# Patient Record
Sex: Female | Born: 1955 | Race: Black or African American | Hispanic: No | Marital: Single | Smoking: Never smoker
Health system: Southern US, Community
[De-identification: ages and names within clinical notes are randomized; demographics above are authoritative.]

## PROBLEM LIST (undated history)

## (undated) DIAGNOSIS — M1990 Unspecified osteoarthritis, unspecified site: Secondary | ICD-10-CM

## (undated) DIAGNOSIS — F419 Anxiety disorder, unspecified: Secondary | ICD-10-CM

## (undated) DIAGNOSIS — K219 Gastro-esophageal reflux disease without esophagitis: Secondary | ICD-10-CM

## (undated) DIAGNOSIS — E119 Type 2 diabetes mellitus without complications: Secondary | ICD-10-CM

## (undated) DIAGNOSIS — I10 Essential (primary) hypertension: Secondary | ICD-10-CM

## (undated) DIAGNOSIS — F0390 Unspecified dementia without behavioral disturbance: Secondary | ICD-10-CM

## (undated) HISTORY — PX: TUBAL LIGATION: SHX77

## (undated) HISTORY — PX: NO PAST SURGERIES: SHX2092

---
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
## 2005-12-11 ENCOUNTER — Emergency Department: Payer: Self-pay | Admitting: Emergency Medicine

## 2006-10-24 ENCOUNTER — Emergency Department: Payer: Self-pay | Admitting: Emergency Medicine

## 2006-12-12 ENCOUNTER — Emergency Department: Payer: Self-pay | Admitting: Emergency Medicine

## 2009-03-21 ENCOUNTER — Emergency Department: Payer: Self-pay | Admitting: Emergency Medicine

## 2010-03-09 ENCOUNTER — Inpatient Hospital Stay: Payer: Self-pay | Admitting: Psychiatry

## 2011-01-13 ENCOUNTER — Ambulatory Visit: Payer: Self-pay | Admitting: Family Medicine

## 2011-01-30 ENCOUNTER — Ambulatory Visit: Payer: Self-pay | Admitting: Family Medicine

## 2011-04-20 ENCOUNTER — Emergency Department: Payer: Self-pay | Admitting: Unknown Physician Specialty

## 2011-08-17 LAB — CBC: HGB: 11.4 g/dL — ABNORMAL LOW (ref 12.0–16.0)

## 2011-08-17 LAB — COMPREHENSIVE METABOLIC PANEL
Alkaline Phosphatase: 70 U/L (ref 50–136)
BUN: 23 mg/dL — ABNORMAL HIGH (ref 7–18)
Bilirubin,Total: 0.2 mg/dL (ref 0.2–1.0)
Calcium, Total: 10 mg/dL (ref 8.5–10.1)
Co2: 30 mmol/L (ref 21–32)
Creatinine: 1.15 mg/dL (ref 0.60–1.30)
Glucose: 138 mg/dL — ABNORMAL HIGH (ref 65–99)
Potassium: 3.9 mmol/L (ref 3.5–5.1)
SGOT(AST): 21 U/L (ref 15–37)
SGPT (ALT): 21 U/L
Sodium: 140 mmol/L (ref 136–145)
Total Protein: 8.3 g/dL — ABNORMAL HIGH (ref 6.4–8.2)

## 2011-08-17 LAB — ETHANOL
Ethanol %: <0.003 % (ref 0.000–0.080)
Ethanol: <3 mg/dL

## 2011-08-17 LAB — DRUG SCREEN, URINE
Amphetamines, Ur Screen: NEGATIVE (ref ?–1000)
Barbiturates, Ur Screen: NEGATIVE (ref ?–200)
Cannabinoid 50 Ng, Ur ~~LOC~~: NEGATIVE (ref ?–50)
Cocaine Metabolite,Ur ~~LOC~~: NEGATIVE (ref ?–300)
Methadone, Ur Screen: NEGATIVE (ref ?–300)
Opiate, Ur Screen: NEGATIVE (ref ?–300)
Phencyclidine (PCP) Ur S: NEGATIVE (ref ?–25)
Tricyclic, Ur Screen: NEGATIVE (ref ?–1000)

## 2011-08-17 LAB — SALICYLATE LEVEL: Salicylates, Serum: <1.7 mg/dL

## 2011-08-18 ENCOUNTER — Inpatient Hospital Stay: Payer: Self-pay | Admitting: Psychiatry

## 2012-05-19 LAB — HM DIABETES EYE EXAM

## 2012-08-23 ENCOUNTER — Ambulatory Visit: Payer: Self-pay | Admitting: Adult Health

## 2012-08-25 ENCOUNTER — Ambulatory Visit: Payer: Self-pay | Admitting: Adult Health

## 2012-10-12 ENCOUNTER — Ambulatory Visit: Payer: Self-pay

## 2014-01-14 ENCOUNTER — Emergency Department: Payer: Self-pay | Admitting: Emergency Medicine

## 2014-01-31 ENCOUNTER — Ambulatory Visit: Payer: Self-pay

## 2014-02-19 ENCOUNTER — Ambulatory Visit: Payer: Self-pay | Admitting: Nurse Practitioner

## 2014-03-20 ENCOUNTER — Ambulatory Visit: Payer: Self-pay | Admitting: Nurse Practitioner

## 2014-04-18 ENCOUNTER — Ambulatory Visit: Payer: Self-pay

## 2014-04-20 ENCOUNTER — Ambulatory Visit: Payer: Self-pay | Admitting: Nurse Practitioner
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
## 2014-08-23 ENCOUNTER — Ambulatory Visit: Payer: Self-pay

## 2014-10-02 ENCOUNTER — Emergency Department
Admission: EM | Admit: 2014-10-02 | Discharge: 2014-10-04 | Disposition: A | Discharge status: Other Acute Inpatient Hospital | Payer: Medicaid Other | Attending: Student | Admitting: Student

## 2014-10-02 DIAGNOSIS — R45851 Suicidal ideations: Secondary | ICD-10-CM

## 2014-10-02 DIAGNOSIS — F600 Paranoid personality disorder: Secondary | ICD-10-CM

## 2014-10-02 DIAGNOSIS — F99 Mental disorder, not otherwise specified: Secondary | ICD-10-CM | POA: Diagnosis not present

## 2014-10-02 DIAGNOSIS — I10 Essential (primary) hypertension: Secondary | ICD-10-CM

## 2014-10-02 DIAGNOSIS — R45850 Homicidal ideations: Secondary | ICD-10-CM

## 2014-10-02 DIAGNOSIS — E119 Type 2 diabetes mellitus without complications: Secondary | ICD-10-CM | POA: Insufficient documentation

## 2014-10-02 DIAGNOSIS — F322 Major depressive disorder, single episode, severe without psychotic features: Secondary | ICD-10-CM

## 2014-10-02 HISTORY — DX: Anxiety disorder, unspecified: F41.9

## 2014-10-02 HISTORY — DX: Type 2 diabetes mellitus without complications: E11.9

## 2014-10-02 HISTORY — DX: Essential (primary) hypertension: I10

## 2014-10-02 LAB — COMPREHENSIVE METABOLIC PANEL
ALK PHOS: 74 U/L (ref 38–126)
ALT: 17 U/L (ref 14–54)
ANION GAP: 8 (ref 5–15)
AST: 20 U/L (ref 15–41)
Albumin: 4.6 g/dL (ref 3.5–5.0)
BILIRUBIN TOTAL: 0.2 mg/dL — AB (ref 0.3–1.2)
BUN: 29 mg/dL — ABNORMAL HIGH (ref 6–20)
CALCIUM: 10.1 mg/dL (ref 8.9–10.3)
CO2: 27 mmol/L (ref 22–32)
Chloride: 102 mmol/L (ref 101–111)
Creatinine, Ser: 1.44 mg/dL — ABNORMAL HIGH (ref 0.44–1.00)
GFR calc Af Amer: 45 mL/min — ABNORMAL LOW (ref >60)
GFR, EST NON AFRICAN AMERICAN: 39 mL/min — AB (ref >60)
Glucose, Bld: 230 mg/dL — ABNORMAL HIGH (ref 65–99)
Potassium: 3.9 mmol/L (ref 3.5–5.1)
SODIUM: 137 mmol/L (ref 135–145)
TOTAL PROTEIN: 8.3 g/dL — AB (ref 6.5–8.1)

## 2014-10-02 LAB — URINE DRUG SCREEN, QUALITATIVE (ARMC ONLY)
AMPHETAMINES, UR SCREEN: NOT DETECTED
Barbiturates, Ur Screen: NOT DETECTED
Benzodiazepine, Ur Scrn: NOT DETECTED
COCAINE METABOLITE, UR ~~LOC~~: NOT DETECTED
Cannabinoid 50 Ng, Ur ~~LOC~~: NOT DETECTED
MDMA (Ecstasy)Ur Screen: NOT DETECTED
Methadone Scn, Ur: NOT DETECTED
OPIATE, UR SCREEN: NOT DETECTED
PHENCYCLIDINE (PCP) UR S: NOT DETECTED
TRICYCLIC, UR SCREEN: NOT DETECTED

## 2014-10-02 LAB — CBC
HCT: 37.1 % (ref 35.0–47.0)
Hemoglobin: 11.7 g/dL — ABNORMAL LOW (ref 12.0–16.0)
MCH: 29.3 pg (ref 26.0–34.0)
MCHC: 31.6 g/dL — AB (ref 32.0–36.0)
MCV: 92.7 fL (ref 80.0–100.0)
PLATELETS: 292 10*3/uL (ref 150–440)
RBC: 4 MIL/uL (ref 3.80–5.20)
RDW: 12.9 % (ref 11.5–14.5)
WBC: 8.8 10*3/uL (ref 3.6–11.0)

## 2014-10-02 LAB — ACETAMINOPHEN LEVEL: Acetaminophen (Tylenol), Serum: <10 ug/mL — ABNORMAL LOW (ref 10–30)

## 2014-10-02 LAB — URINALYSIS COMPLETE WITH MICROSCOPIC (ARMC ONLY)
Bilirubin Urine: NEGATIVE
GLUCOSE, UA: NEGATIVE mg/dL
HGB URINE DIPSTICK: NEGATIVE
Ketones, ur: NEGATIVE mg/dL
NITRITE: NEGATIVE
PH: 5 (ref 5.0–8.0)
Protein, ur: NEGATIVE mg/dL
Specific Gravity, Urine: 1.018 (ref 1.005–1.030)

## 2014-10-02 LAB — ETHANOL: Alcohol, Ethyl (B): <5 mg/dL (ref <5)

## 2014-10-02 LAB — SALICYLATE LEVEL

## 2014-10-02 NOTE — ED Notes (Signed)
Collected yellow ring from pt. In traige  Put in urine cup with label  Put in belonging bag LM EDT
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
## 2014-10-03 ENCOUNTER — Other Ambulatory Visit: Payer: Self-pay

## 2014-10-03 ENCOUNTER — Encounter: Payer: Self-pay | Admitting: Emergency Medicine

## 2014-10-03 DIAGNOSIS — F332 Major depressive disorder, recurrent severe without psychotic features: Secondary | ICD-10-CM

## 2014-10-03 DIAGNOSIS — E119 Type 2 diabetes mellitus without complications: Secondary | ICD-10-CM

## 2014-10-03 DIAGNOSIS — F600 Paranoid personality disorder: Secondary | ICD-10-CM

## 2014-10-03 DIAGNOSIS — F322 Major depressive disorder, single episode, severe without psychotic features: Secondary | ICD-10-CM

## 2014-10-03 DIAGNOSIS — I10 Essential (primary) hypertension: Secondary | ICD-10-CM

## 2014-10-03 LAB — GLUCOSE, CAPILLARY
Glucose-Capillary: 389 mg/dL — ABNORMAL HIGH (ref 65–99)
Glucose-Capillary: 322 mg/dL — ABNORMAL HIGH (ref 65–99)

## 2014-10-03 MED ORDER — INSULIN ASPART 100 UNIT/ML ~~LOC~~ SOLN
SUBCUTANEOUS | Status: AC
  Filled 2014-10-03: qty 8

## 2014-10-03 MED ORDER — DIPHENHYDRAMINE HCL 50 MG PO CAPS
50.0000 mg | ORAL_CAPSULE | Freq: Once | ORAL | Status: AC
  Administered 2014-10-03: 50 mg via ORAL

## 2014-10-03 MED ORDER — DIPHENHYDRAMINE HCL 25 MG PO CAPS
ORAL_CAPSULE | ORAL | Status: AC
  Administered 2014-10-03: 50 mg via ORAL
  Filled 2014-10-03: qty 2

## 2014-10-03 MED ORDER — LISINOPRIL 20 MG PO TABS
20.0000 mg | ORAL_TABLET | Freq: Every day | ORAL | Status: DC
  Administered 2014-10-03: 20 mg via ORAL

## 2014-10-03 MED ORDER — LISINOPRIL 20 MG PO TABS
ORAL_TABLET | ORAL | Status: AC
  Filled 2014-10-03: qty 1

## 2014-10-03 MED ORDER — INSULIN ASPART 100 UNIT/ML ~~LOC~~ SOLN
8.0000 [IU] | Freq: Once | SUBCUTANEOUS | Status: AC
  Administered 2014-10-03: 8 [IU] via SUBCUTANEOUS

## 2014-10-03 MED ORDER — METFORMIN HCL 500 MG PO TABS: 1000.0000 mg | ORAL_TABLET | Freq: Every day | ORAL | Status: DC

## 2014-10-03 NOTE — ED Notes (Signed)
BEHAVIORAL HEALTH ROUNDING Patient sleeping: No. Patient alert and oriented: yes Behavior appropriate: Yes.  ; If no, describe:  Nutrition and fluids offered: Yes  Toileting and hygiene offered: Yes  Sitter present: not applicable Law enforcement present: Yes  

## 2014-10-03 NOTE — ED Notes (Addendum)
Pt concerned for glucose level. Checked per pt request Pt's blood glucose 389 this am. Pt has eaten breakfast. Pt reports she does not know her medication dosages or pharmacy. Pt calls family and home and they are suppose to bring in a list of medications. Edd Fabian, MD informed

## 2014-10-03 NOTE — BHH Counselor (Signed)
Pt. is to be admitted to [facility] by Dr. Weber Cooks. Attending Physician will be Dr. Bary Leriche.  Pt. has been assigned to room 322A, by Myrtle.  Intake Paper Work has been signed and placed on pt. chart. ER staff Lattie Haw, ER Sect.) have been made aware of the admission.

## 2014-10-03 NOTE — ED Notes (Signed)
BEHAVIORAL HEALTH ROUNDING Patient sleeping: No. Patient alert and oriented: yes Behavior appropriate: Yes.  ; If no, describe:  Nutrition and fluids offered: Yes  Toileting and hygiene offered: Yes  Sitter present: yes Law enforcement present: Yes  

## 2014-10-03 NOTE — ED Notes (Signed)
BEHAVIORAL HEALTH ROUNDING  Patient sleeping: Yes.  Patient alert and oriented: Sleeping  Behavior appropriate: Yes. ; If no, describe:  Nutrition and fluids offered: Sleeping  Toileting and hygiene offered: Sleeping  Sitter present: yes  Law enforcement present: Yes   

## 2014-10-03 NOTE — ED Notes (Signed)

## 2014-10-03 NOTE — ED Notes (Signed)
BEHAVIORAL HEALTH ROUNDING Patient sleeping: No. Patient alert and oriented: yes Behavior appropriate: Yes.  ;  Nutrition and fluids offered: Yes  Toileting and hygiene offered: Yes  Sitter present: no Law enforcement present: Yes   

## 2014-10-03 NOTE — ED Notes (Signed)
BEHAVIORAL HEALTH ROUNDING Patient sleeping: No   Patient alert and oriented: Yes Behavior appropriate: Yes.  ; If no, describe:  Nutrition and fluids offered: Yes  Toileting and hygiene offered: Yes Sitter present: yes Law enforcement present: Yes  

## 2014-10-03 NOTE — Consult Note (Signed)
[place] [facility] Consult   Reason for Consult:  Consult for this 59 year old woman with a history of mood instability who came in after pulling a gun on her husband and threatening to shoot him and shoot herself Referring Physician:  Edd Fabian Patient Identification: Kelly Doyle MRN:  [id number] Principal Diagnosis: Major depression Diagnosis:   Patient Active Problem List   Diagnosis Date Noted  . Major depression [F32.2] 10/03/2014  . 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 Diabetes [E11.9] 10/03/2014  . Hypertension [I10] 10/03/2014    Total Time spent with patient: 1 hour  Subjective:   Kelly Doyle is a 59 y.o. female patient admitted with "I guess I had one of my episodes". Patient describes chronic mood symptoms that it been worse recently and admits that she had been trying to shoot her husband.  HPI:  Information obtained from the patient and the chart. Patient says that she did pull out a pistol and was going to shoot her husband and then shoot herself. Law enforcement got called. They described her as waving the gun around which she denies. In any case she admits that she was having suicidal and homicidal ideation. She has chronic problems with her mood. She says that when she lives with her husband she always feels bad and angry all the time. She's been staying with him now for about 2 years. Mood stays back a lot of the time. A lot of her problems with her mood are focused on interactions with her next-door neighbors against whom she has some kind of long-standing vendetta. She sleeps poorly but take sleeping pills to help with that. Feels irritable quite a bit. She is not currently taking antidepressive and sore seeing anyone for outpatient psychiatric treatment. She denies that she drinks or abuses any drugs.  Past psychiatric history: Patient has had prior psychiatric hospitalizations and that includes one hospitalization here in 2013. Dr. Bary Leriche diagnosed  her as having depression and anxiety. Patient has had follow-up arranged at [facility] but doesn't code regularly and doesn't take her medicine. She has had suicide attempts in the past and has had episodes of getting aggressive in the past. Doesn't report having had psychotic episodes in the past no clear mania.  Social history: Lives with her husband. She has extended family including children but stays in only loose contact with them. She hasn't been able to work very regularly has part-time work doing Designer, industrial/product work.  Family history: She says she had an aunt who had schizophrenia  Medical history: History of diabetes and high blood pressure. Not currently regularly taking medicine for them.  Substance abuse history: Denies that she drinks or uses drugs and there doesn't appear to be any history of substance abuse problems. HPI Elements:   Quality:  Anger and depression. Severity:  Severe potentially life threatening. Timing:  Bad yesterday and today. Duration:  Part of a long-standing chronic problem. Context:  Frustration with her husband and being off of her medicine.  Past Medical History:  Past Medical History  Diagnosis Date  . Diabetes mellitus without complication   . Hypertension   . Anxiety    History reviewed. No pertinent past surgical history. Family History: No family history on file. Social History:  History  Alcohol Use: Not on file     History  Drug Use Not on file    History   Social History  . Marital Status: Single    Spouse Name: N/A  . Number of Children: N/A  .  Years of Education: N/A   Social History Main Topics  . Smoking status: Not on file  . Smokeless tobacco: Not on file  . Alcohol Use: Not on file  . Drug Use: Not on file  . Sexual Activity: Not on file   Other Topics Concern  . None   Social History Narrative  . None   Additional Social History:    History of alcohol / drug use?: No history of alcohol / drug abuse                      Allergies:  No Known Allergies  Labs:  Results for orders placed or performed during the hospital encounter of 10/02/14 (from the past 48 hour(s))  Acetaminophen level     Status: Abnormal   Collection Time: 10/02/14  9:07 PM  Result Value Ref Range   Acetaminophen (Tylenol), Serum <10 (L) 10 - 30 ug/mL    Comment:        THERAPEUTIC CONCENTRATIONS VARY SIGNIFICANTLY. A RANGE OF 10-30 ug/mL MAY BE AN EFFECTIVE CONCENTRATION FOR MANY PATIENTS. HOWEVER, SOME ARE BEST TREATED AT CONCENTRATIONS OUTSIDE THIS RANGE. ACETAMINOPHEN CONCENTRATIONS >150 ug/mL AT 4 HOURS AFTER INGESTION AND >50 ug/mL AT 12 HOURS AFTER INGESTION ARE OFTEN ASSOCIATED WITH TOXIC REACTIONS.   CBC     Status: Abnormal   Collection Time: 10/02/14  9:07 PM  Result Value Ref Range   WBC 8.8 3.6 - 11.0 K/uL   RBC 4.00 3.80 - 5.20 MIL/uL   Hemoglobin 11.7 (L) 12.0 - 16.0 g/dL   HCT 37.1 35.0 - 47.0 %   MCV 92.7 80.0 - 100.0 fL   MCH 29.3 26.0 - 34.0 pg   MCHC 31.6 (L) 32.0 - 36.0 g/dL   RDW 12.9 11.5 - 14.5 %   Platelets 292 150 - 440 K/uL  Comprehensive metabolic panel     Status: Abnormal   Collection Time: 10/02/14  9:07 PM  Result Value Ref Range   Sodium 137 135 - 145 mmol/L   Potassium 3.9 3.5 - 5.1 mmol/L   Chloride 102 101 - 111 mmol/L   CO2 27 22 - 32 mmol/L   Glucose, Bld 230 (H) 65 - 99 mg/dL   BUN 29 (H) 6 - 20 mg/dL   Creatinine, Ser 1.44 (H) 0.44 - 1.00 mg/dL   Calcium 10.1 8.9 - 10.3 mg/dL   Total Protein 8.3 (H) 6.5 - 8.1 g/dL   Albumin 4.6 3.5 - 5.0 g/dL   AST 20 15 - 41 U/L   ALT 17 14 - 54 U/L   Alkaline Phosphatase 74 38 - 126 U/L   Total Bilirubin 0.2 (L) 0.3 - 1.2 mg/dL   GFR calc non Af Amer 39 (L) >60 mL/min   GFR calc Af Amer 45 (L) >60 mL/min    Comment: (NOTE) The eGFR has been calculated using the CKD EPI equation. This calculation has not been validated in all clinical situations. eGFR's persistently <60 mL/min signify possible Chronic Kidney Disease.     Anion gap 8 5 - 15  Ethanol (ETOH)     Status: None   Collection Time: 10/02/14  9:07 PM  Result Value Ref Range   Alcohol, Ethyl (B) <5 <5 mg/dL    Comment:        LOWEST DETECTABLE LIMIT FOR SERUM ALCOHOL IS 5 mg/dL FOR MEDICAL PURPOSES ONLY   Salicylate level     Status: None   Collection Time: 10/02/14  9:07 PM  Result Value Ref Range   Salicylate Lvl <4.1 2.8 - 30.0 mg/dL  Urinalysis complete, with microscopic ([facility] only)     Status: Abnormal   Collection Time: 10/02/14  9:07 PM  Result Value Ref Range   Color, Urine YELLOW (A) YELLOW   APPearance HAZY (A) CLEAR   Glucose, UA NEGATIVE NEGATIVE mg/dL   Bilirubin Urine NEGATIVE NEGATIVE   Ketones, ur NEGATIVE NEGATIVE mg/dL   Specific Gravity, Urine 1.018 1.005 - 1.030   Hgb urine dipstick NEGATIVE NEGATIVE   pH 5.0 5.0 - 8.0   Protein, ur NEGATIVE NEGATIVE mg/dL   Nitrite NEGATIVE NEGATIVE   Leukocytes, UA 3+ (A) NEGATIVE   RBC / HPF 6-30 0 - 5 RBC/hpf   WBC, UA 6-30 0 - 5 WBC/hpf   Bacteria, UA RARE (A) NONE SEEN   Squamous Epithelial / LPF 6-30 (A) NONE SEEN   Mucous PRESENT   Urine Drug Screen, Qualitative ([facility] only)     Status: None   Collection Time: 10/02/14  9:07 PM  Result Value Ref Range   Tricyclic, Ur Screen NONE DETECTED NONE DETECTED   Amphetamines, Ur Screen NONE DETECTED NONE DETECTED   MDMA (Ecstasy)Ur Screen NONE DETECTED NONE DETECTED   Cocaine Metabolite,Ur [place] NONE DETECTED NONE DETECTED   Opiate, Ur Screen NONE DETECTED NONE DETECTED   Phencyclidine (PCP) Ur S NONE DETECTED NONE DETECTED   Cannabinoid 50 Ng, Ur [place] NONE DETECTED NONE DETECTED   Barbiturates, Ur Screen NONE DETECTED NONE DETECTED   Benzodiazepine, Ur Scrn NONE DETECTED NONE DETECTED   Methadone Scn, Ur NONE DETECTED NONE DETECTED    Comment: (NOTE) 287  Tricyclics, urine               Cutoff 1000 ng/mL 200  Amphetamines, urine             Cutoff 1000 ng/mL 300  MDMA (Ecstasy), urine           Cutoff 500 ng/mL 400  Cocaine  Metabolite, urine       Cutoff 300 ng/mL 500  Opiate, urine                   Cutoff 300 ng/mL 600  Phencyclidine (PCP), urine      Cutoff 25 ng/mL 700  Cannabinoid, urine              Cutoff 50 ng/mL 800  Barbiturates, urine             Cutoff 200 ng/mL 900  Benzodiazepine, urine           Cutoff 200 ng/mL 1000 Methadone, urine                Cutoff 300 ng/mL 1100 1200 The urine drug screen provides only a preliminary, unconfirmed 1300 analytical test result and should not be used for non-medical 1400 purposes. Clinical consideration and professional judgment should 1500 be applied to any positive drug screen result due to possible 1600 interfering substances. A more specific alternate chemical method 1700 must be used in order to obtain a confirmed analytical result.  1800 Gas chromato graphy / mass spectrometry (GC/MS) is the preferred 1900 confirmatory method.   Glucose, capillary     Status: Abnormal   Collection Time: 10/03/14  9:43 AM  Result Value Ref Range   Glucose-Capillary 389 (H) 65 - 99 mg/dL  Glucose, capillary     Status: Abnormal   Collection Time: 10/03/14 11:23 AM  Result Value Ref Range  Glucose-Capillary 322 (H) 65 - 99 mg/dL    Vitals: Blood pressure 123/67, pulse 97, temperature 97.9 F (36.6 C), temperature source Oral, resp. rate 18, height 5' 2"  (1.575 m), weight 77.111 kg (170 lb), SpO2 99 %.  Risk to Self: Suicidal Ideation: No Suicidal Intent: No Is patient at risk for suicide?: No Suicidal Plan?: No Access to Means: No What has been your use of drugs/alcohol within the last 12 months?: no usage How many times?: 0 Other Self Harm Risks: None Triggers for Past Attempts: None known Intentional Self Injurious Behavior: None Risk to Others: Homicidal Ideation: No-Not Currently/Within Last 6 Months Thoughts of Harm to Others: No-Not Currently Present/Within Last 6 Months Current Homicidal Intent: No Current Homicidal Plan: No-Not Currently/Within  Last 6 Months Access to Homicidal Means: No History of harm to others?: No (Denied) Assessment of Violence: None Noted Violent Behavior Description:  (Communication of threats) Does patient have access to weapons?: No Criminal Charges Pending?: Yes Describe Pending Criminal Charges: Communication of threats Does patient have a court date: Yes Court Date: 10/15/14 Prior Inpatient Therapy: Prior Inpatient Therapy: No Prior Outpatient Therapy:    Current Facility-Administered Medications  Medication Dose Route Frequency Provider Last Rate Last Dose  . lisinopril (PRINIVIL,ZESTRIL) tablet 20 mg  20 mg Oral Daily Gonzella Lex, MD      . Derrill Memo ON 10/04/2014] metFORMIN (GLUCOPHAGE) tablet 1,000 mg  1,000 mg Oral Q breakfast Gonzella Lex, MD       Current Outpatient Prescriptions  Medication Sig Dispense Refill  . bisacodyl (DULCOLAX) 5 MG EC tablet Take 5 mg by mouth daily as needed for moderate constipation.    Marland Kitchen glipiZIDE (GLUCOTROL) 5 MG tablet Take 5 mg by mouth daily.    . hydrochlorothiazide (HYDRODIURIL) 25 MG tablet Take 25 mg by mouth daily.    Marland Kitchen lisinopril (PRINIVIL,ZESTRIL) 20 MG tablet Take 20 mg by mouth daily.      Musculoskeletal: Strength & Muscle Tone: within normal limits Gait & Station: normal Patient leans: N/A  Psychiatric Specialty Exam: Physical Exam  Constitutional: She appears well-developed and well-nourished.  HENT:  Head: Normocephalic and atraumatic.  Eyes: Conjunctivae are normal. Pupils are equal, round, and reactive to light.  Neck: Normal range of motion.  Cardiovascular: Normal heart sounds.   Respiratory: Effort normal.  GI: Soft.  Musculoskeletal: Normal range of motion.  Neurological: She is alert.  Skin: Skin is warm and dry.  Psychiatric: Her affect is blunt. Her speech is delayed. She is withdrawn. Cognition and memory are impaired. She expresses impulsivity. She exhibits a depressed mood. She expresses homicidal and suicidal ideation.   Patient currently presents as somewhat disheveled and withdrawn. She cooperates with the interview but doesn't communicate very much.    Review of Systems  Constitutional: Negative.   HENT: Negative.   Eyes: Negative.   Respiratory: Negative.   Cardiovascular: Negative.   Gastrointestinal: Negative.   Musculoskeletal: Negative.   Skin: Negative.   Neurological: Negative.   Psychiatric/Behavioral: Positive for depression and suicidal ideas. Negative for hallucinations and substance abuse. The patient is nervous/anxious and has insomnia.     Blood pressure 123/67, pulse 97, temperature 97.9 F (36.6 C), temperature source Oral, resp. rate 18, height 5' 2"  (1.575 m), weight 77.111 kg (170 lb), SpO2 99 %.Body mass index is 31.09 kg/(m^2).  General Appearance: Disheveled  Eye Sport and exercise psychologist::  Fair  Speech:  Normal Rate  Volume:  Decreased  Mood:  Depressed  Affect:  Depressed  Thought Process:  Tangential  Orientation:  Full (Time, Place, and Person)  Thought Content:  Negative  Suicidal Thoughts:  Yes.  with intent/plan  Homicidal Thoughts:  Yes.  with intent/plan  Memory:  Immediate;   Good Recent;   Good Remote;   Fair  Judgement:  Poor  Insight:  Shallow  Psychomotor Activity:  Decreased  Concentration:  Poor  Recall:  [employer] of Knowledge:Fair  Language: Fair  Akathisia:  No  Handed:  Right  AIMS (if indicated):     Assets:  Desire for Improvement Financial Resources/Insurance Housing Social Support  ADL's:  Intact  Cognition: WNL  Sleep:      Medical Decision Making: Review of Psycho-Social Stressors (1), Established Problem, Worsening (2), Review of Last Therapy Session (1), Review or order medicine tests (1), Review of Medication Regimen & Side Effects (2) and Review of New Medication or Change in Dosage (2)  Treatment Plan Summary: Medication management and Plan Patient with a history of depression and anxiety currently presents with labile mood agitation and  depression. Admits that she was holding a gun on her husband and thinking about shooting herself to. Currently she is pretty calm and seems almost nonchalant about the whole thing. Doesn't show a clear evidence of psychosis. Not abusing drugs. Patient requires admission to psychiatry for stabilization based on dangerous behavior. Under commitment. Admit to psychiatric ward. Restart citalopram. She will need her diabetes management and I will put in a medicine consult along with that.  Plan:  Recommend psychiatric Inpatient admission when medically cleared. Supportive therapy provided about ongoing stressors. Discussed crisis plan, support from social network, calling 911, coming to the Emergency Department, and calling Suicide Hotline. Disposition: Admit to psychiatry when bed available  Alethia Berthold 10/03/2014 3:45 PM

## 2014-10-03 NOTE — ED Notes (Signed)
BEHAVIORAL HEALTH ROUNDING Patient sleeping: Yes.   Patient alert and oriented: yes Behavior appropriate: Yes.  ; If no, describe:  Nutrition and fluids offered: Yes  Toileting and hygiene offered: Yes  Sitter present: not applicable Law enforcement present: Yes  

## 2014-10-03 NOTE — ED Notes (Signed)
Kelly Doyle Is the patient under IVC or is there intent for IVC: Yes.   Is the patient medically cleared: Yes.   Is there vacancy in the [facility]: Yes.   Is the population mix appropriate for patient: Yes.   Is the patient awaiting placement in inpatient or outpatient setting: Yes.   Has the patient had a psychiatric consult: Yes.   Survey of unit performed for contraband, proper placement and condition of furniture, tampering with fixtures in bathroom, shower, and each patient room: Yes.  ; Findings:  APPEARANCE/BEHAVIOR calm, cooperative and adequate rapport can be established NEURO ASSESSMENT Orientation: time, place and person Hallucinations: No.None noted (Hallucinations) Speech: Normal Gait: normal RESPIRATORY ASSESSMENT Normal expansion.  Clear to auscultation.  No rales, rhonchi, or wheezing. CARDIOVASCULAR ASSESSMENT regular rate and rhythm, S1, S2 normal, no murmur, click, rub or gallop GASTROINTESTINAL ASSESSMENT soft, nontender, BS WNL, no r/g EXTREMITIES normal strength, tone, and muscle mass, no deformities, no erythema, induration, or nodules, ROM of all joints is normal, no evidence of joint instability PLAN OF CARE Provide calm/safe environment. Vital signs assessed twice daily. [facility] Assessment once each 12-hour shift. Collaborate with intake RN daily or as condition indicates. Assure the [facility] provider has rounded once each shift. Provide and encourage hygiene. Provide redirection as needed. Assess for escalating behavior; address immediately and inform [facility] provider.  Assess family dynamic and appropriateness for visitation as needed: No.; If necessary, describe findings: Un able to assess family dynamics.  Educate the patient/family about BHU procedures/visitation: Yes.  ; If necessary, describe findings:

## 2014-10-03 NOTE — ED Notes (Signed)
Pt's daughter called and she would like for the psychiatrist to call her tomorrow  Daughter's name is Gaylan Gerold  [phone]

## 2014-10-03 NOTE — BH Assessment (Signed)
Referral information for Geriatric Placement have been faxed to;   [facility]([phone])  [facility]([phone]),   [facility] [phone]),   [facility] [phone]),   [phone]),   [phone]),   [facility]([phone]),   [facility] [phone]),   [facility] [phone]),   [facility]([phone]),  [facility]([phone])

## 2014-10-03 NOTE — ED Notes (Signed)
BEHAVIORAL HEALTH ROUNDING Patient sleeping: No. Patient alert and oriented: yes Behavior appropriate: Yes.  ; If no, describe:  Nutrition and fluids offered: yes Toileting and hygiene offered: Yes  Sitter present: q15 minute observations and security camera monitoring Law enforcement present: Yes  [facility]  

## 2014-10-03 NOTE — ED Notes (Signed)
Pt transferred into ED BHU room 4    Patient assigned to appropriate care area. Patient oriented to unit/care area: Informed that, for their safety, care areas are designed for safety and monitored by security cameras at all times; Visiting hours and phone times explained to patient. Patient verbalizes understanding, and verbal contract for safety   Psych consult pending.

## 2014-10-03 NOTE — ED Notes (Addendum)
BEHAVIORAL HEALTH ROUNDING Patient sleeping: No. Patient alert and oriented: yes Behavior appropriate: Yes.  ;  Nutrition and fluids offered: Yes  Toileting and hygiene offered: Yes  Sitter present: no Law enforcement present: Yes   

## 2014-10-03 NOTE — ED Notes (Signed)
Transfer to BHU  

## 2014-10-03 NOTE — ED Notes (Signed)
Pt visiting with husband.

## 2014-10-03 NOTE — ED Notes (Signed)
Patient assigned to appropriate care area. Patient oriented to unit/care area: Informed that, for their safety, care areas are designed for safety and monitored by security cameras at all times; and visiting hours explained to patient. Patient verbalizes understanding, and verbal contract for safety obtained. 

## 2014-10-03 NOTE — ED Notes (Signed)
Pt given breakfast tray

## 2014-10-03 NOTE — ED Notes (Signed)
Meal given to Pt.

## 2014-10-03 NOTE — ED Notes (Signed)
Pt observed with no unusual behavior  Appropriate to stimulation  No verbalized needs or concerns at this time  NAD assessed  Continue to monitor 

## 2014-10-03 NOTE — BH Assessment (Signed)
Assessment Note  Kelly Doyle is an 59 y.o. female. She reports to the ED under IVC.  She is reported as being angry and aggressive.  She is reported as waking a gun around while agitated, exclaiming she would kill her boyfriend and herself. Ms. Kelly Doyle expressed "I got in trouble and they got scared for me.  My kids think I lost it because I threatened to kill my boyfriend and myself. I was angry. I might of shot him if I could use the damn gun".  She further states, "I threaten, but I never tried to hurt no one".  Ms. Kelly Doyle denied being depressed or anxious. She denied auditory or visual hallucinations. She denied homicidal or suicidal ideation or intent.  Ms. Kelly Doyle stated that she has been under stress since her dog died.  She states that she has a prior charge of communicating a threat.  She has a history of services at [facility], but she has not continued with treatment.     Axis I: Bipolar, mixed Axis II: Deferred Axis III: No past medical history on file. Axis IV: other psychosocial or environmental problems Axis V: 31-40 impairment in reality testing  Past Medical History: No past medical history on file.  No past surgical history on file.  Family History: No family history on file.  Social History:  has no tobacco, alcohol, and drug history on file.  Additional Social History:  Alcohol / Drug Use History of alcohol / drug use?: No history of alcohol / drug abuse  CIWA: CIWA-Ar BP: 136/76 mmHg Pulse Rate: 90 COWS:    Allergies: No Known Allergies  Home Medications:  (Not in a hospital admission)  OB/GYN Status:  No LMP recorded.  General Assessment Data Location of Assessment: [facility] ED TTS Assessment: In system Is this a Tele or Face-to-Face Assessment?: Face-to-Face Is this an Initial Assessment or a Re-assessment for this encounter?: Initial Assessment Marital status: Long term relationship Maiden name: n/a Is patient pregnant?: No Pregnancy Status: No Living  Arrangements: Non-relatives/Friends Can pt return to current living arrangement?: Yes Admission Status: Involuntary Is patient capable of signing voluntary admission?: Yes Referral Source: MD Insurance type: None     Crisis Care Plan Living Arrangements: Non-relatives/Friends Name of Psychiatrist: [place] Name of Therapist: [facility]  Education Status Is patient currently in school?: No Current Grade: n/a Highest grade of school patient has completed: 10th Name of school: n/a Contact person: n/a  Risk to self with the past 6 months Suicidal Ideation: No Has patient been a risk to self within the past 6 months prior to admission? : No Suicidal Intent: No Has patient had any suicidal intent within the past 6 months prior to admission? : No Is patient at risk for suicide?: No Suicidal Plan?: No Has patient had any suicidal plan within the past 6 months prior to admission? : No Access to Means: No What has been your use of drugs/alcohol within the last 12 months?: no usage Previous Attempts/Gestures: No How many times?: 0 Other Self Harm Risks: None Triggers for Past Attempts: None known Intentional Self Injurious Behavior: None Family Suicide History: Unknown Recent stressful life event(s):  (None reported) Persecutory voices/beliefs?: No Depression: No Depression Symptoms:  (None) Substance abuse history and/or treatment for substance abuse?: No Suicide prevention information given to non-admitted patients: Not applicable  Risk to Others within the past 6 months Homicidal Ideation: No-Not Currently/Within Last 6 Months Does patient have any lifetime risk of violence toward others beyond the six  months prior to admission? : No Thoughts of Harm to Others: No-Not Currently Present/Within Last 6 Months Current Homicidal Intent: No Current Homicidal Plan: No-Not Currently/Within Last 6 Months Access to Homicidal Means: No History of harm to others?: No (Denied) Assessment of  Violence: None Noted Violent Behavior Description:  (Communication of threats) Does patient have access to weapons?: No Criminal Charges Pending?: Yes Describe Pending Criminal Charges: Communication of threats Does patient have a court date: Yes Court Date: 10/15/14 Is patient on probation?: No  Psychosis Hallucinations: None noted Delusions: None noted  Mental Status Report Appearance/Hygiene: In scrubs Eye Contact: Good Motor Activity: Unremarkable Speech: Unremarkable Level of Consciousness: Alert Mood: Euthymic Affect: Irritable Anxiety Level: None Thought Processes: Coherent Judgement: Unimpaired Orientation: Person, Place, Time, Situation Obsessive Compulsive Thoughts/Behaviors: None  Cognitive Functioning Appetite: Good Sleep: No Change     Prior Inpatient Therapy Prior Inpatient Therapy: No             Abuse/Neglect Assessment (Assessment to be complete while patient is alone) Physical Abuse: Denies Verbal Abuse: Denies Sexual Abuse: Denies Exploitation of patient/patient's resources: Denies Self-Neglect: Denies Values / Beliefs Cultural Requests During Hospitalization: None Spiritual Requests During Hospitalization: None   Advance Directives (For Healthcare) Does patient have an advance directive?: No Would patient like information on creating an advanced directive?: Yes - Educational materials given          Disposition:  Disposition Initial Assessment Completed for this Encounter: Yes Disposition of Patient: Referred to (Psych MD for consult)  On Site Evaluation by:   Reviewed with Physician:    Guerry Minors 10/03/2014 5:13 AM

## 2014-10-03 NOTE — ED Notes (Signed)
Pt visiting with son.

## 2014-10-03 NOTE — ED Notes (Signed)
Supper provided along with an extra drink  Pt observed with no unusual behavior  Appropriate to stimulation  No verbalized needs or concerns at this time  NAD assessed  Continue to monitor 

## 2014-10-04 ENCOUNTER — Inpatient Hospital Stay
Admission: EM | Admit: 2014-10-04 | Discharge: 2014-10-05 | DRG: 885 | Disposition: A | Discharge status: Home / Self Care | Payer: Medicaid Other | Source: Ambulatory Visit | Attending: Psychiatry | Admitting: Psychiatry

## 2014-10-04 DIAGNOSIS — R45850 Homicidal ideations: Secondary | ICD-10-CM | POA: Diagnosis present

## 2014-10-04 DIAGNOSIS — Z820 Family history of epilepsy and other diseases of the nervous system: Secondary | ICD-10-CM | POA: Diagnosis not present

## 2014-10-04 DIAGNOSIS — I10 Essential (primary) hypertension: Secondary | ICD-10-CM | POA: Diagnosis present

## 2014-10-04 DIAGNOSIS — Z9119 Patient's noncompliance with other medical treatment and regimen: Secondary | ICD-10-CM | POA: Diagnosis present

## 2014-10-04 DIAGNOSIS — F419 Anxiety disorder, unspecified: Secondary | ICD-10-CM | POA: Diagnosis present

## 2014-10-04 DIAGNOSIS — G4700 Insomnia, unspecified: Secondary | ICD-10-CM | POA: Diagnosis present

## 2014-10-04 DIAGNOSIS — E119 Type 2 diabetes mellitus without complications: Secondary | ICD-10-CM | POA: Diagnosis present

## 2014-10-04 DIAGNOSIS — Z818 Family history of other mental and behavioral disorders: Secondary | ICD-10-CM

## 2014-10-04 DIAGNOSIS — F600 Paranoid personality disorder: Secondary | ICD-10-CM | POA: Diagnosis present

## 2014-10-04 DIAGNOSIS — R45851 Suicidal ideations: Secondary | ICD-10-CM | POA: Diagnosis present

## 2014-10-04 DIAGNOSIS — F322 Major depressive disorder, single episode, severe without psychotic features: Secondary | ICD-10-CM | POA: Diagnosis not present

## 2014-10-04 LAB — GLUCOSE, CAPILLARY
GLUCOSE-CAPILLARY: 326 mg/dL — AB (ref 65–99)
Glucose-Capillary: 202 mg/dL — ABNORMAL HIGH (ref 65–99)
Glucose-Capillary: 329 mg/dL — ABNORMAL HIGH (ref 65–99)
Glucose-Capillary: 261 mg/dL — ABNORMAL HIGH (ref 65–99)

## 2014-10-04 LAB — HEMOGLOBIN A1C: Hgb A1c MFr Bld: 8.6 % — ABNORMAL HIGH (ref 4.0–6.0)

## 2014-10-04 MED ORDER — INSULIN ASPART 100 UNIT/ML ~~LOC~~ SOLN
18.0000 [IU] | Freq: Every day | SUBCUTANEOUS | Status: DC
  Administered 2014-10-04: 18 [IU] via SUBCUTANEOUS
  Filled 2014-10-04: qty 18

## 2014-10-04 MED ORDER — LORAZEPAM 2 MG/ML IJ SOLN: 1.0000 mg | Freq: Four times a day (QID) | INTRAMUSCULAR | Status: DC | PRN

## 2014-10-04 MED ORDER — LISINOPRIL 20 MG PO TABS
20.0000 mg | ORAL_TABLET | Freq: Every day | ORAL | Status: DC
  Administered 2014-10-04 – 2014-10-05 (×2): 20 mg via ORAL
  Filled 2014-10-04 (×2): qty 1

## 2014-10-04 MED ORDER — TRAZODONE HCL 100 MG PO TABS: 100.0000 mg | ORAL_TABLET | Freq: Every evening | ORAL | Status: DC | PRN

## 2014-10-04 MED ORDER — CITALOPRAM HYDROBROMIDE 20 MG PO TABS
20.0000 mg | ORAL_TABLET | Freq: Every day | ORAL | Status: DC
Start: 2014-10-04 — End: 2014-10-05
  Administered 2014-10-04 – 2014-10-05 (×2): 20 mg via ORAL
  Filled 2014-10-04 (×3): qty 1

## 2014-10-04 MED ORDER — MAGNESIUM HYDROXIDE 400 MG/5ML PO SUSP: 30.0000 mL | Freq: Every day | ORAL | Status: DC | PRN

## 2014-10-04 MED ORDER — LISINOPRIL 20 MG PO TABS: 20.0000 mg | ORAL_TABLET | Freq: Every day | ORAL | Status: DC

## 2014-10-04 MED ORDER — METFORMIN HCL 500 MG PO TABS: 1000.0000 mg | ORAL_TABLET | Freq: Every day | ORAL | Status: DC

## 2014-10-04 MED ORDER — GLIPIZIDE 10 MG PO TABS
5.0000 mg | ORAL_TABLET | Freq: Every day | ORAL | Status: DC
  Administered 2014-10-05: 10 mg via ORAL
  Filled 2014-10-04 (×2): qty 1

## 2014-10-04 MED ORDER — ACETAMINOPHEN 325 MG PO TABS: 650.0000 mg | ORAL_TABLET | Freq: Four times a day (QID) | ORAL | Status: DC | PRN

## 2014-10-04 MED ORDER — INSULIN DETEMIR 100 UNIT/ML ~~LOC~~ SOLN
18.0000 [IU] | Freq: Every day | SUBCUTANEOUS | Status: DC
  Administered 2014-10-05: 18 [IU] via SUBCUTANEOUS
  Filled 2014-10-04 (×3): qty 0.18

## 2014-10-04 MED ORDER — HYDROCHLOROTHIAZIDE 25 MG PO TABS
25.0000 mg | ORAL_TABLET | Freq: Every day | ORAL | Status: DC
  Administered 2014-10-04 – 2014-10-05 (×2): 25 mg via ORAL
  Filled 2014-10-04 (×2): qty 1

## 2014-10-04 MED ORDER — CLONIDINE HCL 0.1 MG PO TABS
0.1000 mg | ORAL_TABLET | Freq: Once | ORAL | Status: AC
  Administered 2014-10-05: 0.2 mg via ORAL
  Filled 2014-10-04: qty 1

## 2014-10-04 MED ORDER — LORAZEPAM 1 MG PO TABS
ORAL_TABLET | ORAL | Status: AC
Start: 2014-10-04 — End: 2014-10-04
  Administered 2014-10-04: 1 mg via ORAL
  Filled 2014-10-04: qty 1

## 2014-10-04 MED ORDER — ALUM & MAG HYDROXIDE-SIMETH 200-200-20 MG/5ML PO SUSP
30.0000 mL | ORAL | Status: DC | PRN
Start: 2014-10-04 — End: 2014-10-05

## 2014-10-04 MED ORDER — LORAZEPAM 1 MG PO TABS
1.0000 mg | ORAL_TABLET | Freq: Four times a day (QID) | ORAL | Status: DC | PRN
  Administered 2014-10-04: 1 mg via ORAL

## 2014-10-04 NOTE — Progress Notes (Signed)
Patient came to nursing station during shift report around 1915 c/o her head feeling numb on one side. She was shaking and appeared afraid, saying, "What's happening to me." VS obtained, but patient was trembling and it was difficult to get it to register. BP then was 192/112 and HR 129. CBG obtained and was 329. Patient was encouraged to breathe in and out slowly. Her BP and HR came down to 191/83 and HR 124. Rapid response team called. Reassurance offered and patient continuously monitored. BP 199/99 and HR 117. Rapid response nurses here. Will continue to monitor.

## 2014-10-04 NOTE — Progress Notes (Signed)
Recreation Therapy Notes  Date: 06.16.16 Time: 3:05 pm Location: Craft Room  Group Topic: Coping Skills/Leisure Education  Goal Area(s) Addresses:  Patient will identify things they are grateful for. Patient will identify how being grateful can influence your decision making.  Behavioral Response: Attentive, Interactive  Intervention: Grateful Wheel  Activity: Patients were given an "I Am Grateful For" worksheet and instructed to list at least one thing they were grateful for under each category.   Education: LRT educated patient on leisure and why it is important to implement it into their schedules.  Education Outcome: Acknowledges education/In group clarification offered  Clinical Observations/Feedback: Patient participated in group activity. Patient contributed to group discussion by stating things she was grateful for and that she does participate in leisure activities often.  Leonette Monarch, LRT/CTRS 10/04/2014 4:56 PM

## 2014-10-04 NOTE — BHH Group Notes (Signed)
[facility] Group Notes:  (Nursing/MHT/Case Management/Adjunct)  Date:  10/04/2014  Time:  10:19 AM  Type of Therapy:  goals  Participation Level:  Active  Participation Quality:  Appropriate  Affect:  Appropriate  Cognitive:  Appropriate  Insight:  Appropriate  Engagement in Group:  Supportive  Modes of Intervention:  goal setting   Summary of Progress/Problems:  Celso Amy 10/04/2014, 10:19 AM

## 2014-10-04 NOTE — BHH Suicide Risk Assessment (Signed)
[facility] Admission Suicide Risk Assessment   Nursing information obtained from:  Patient Demographic factors:  Unemployed, Access to firearms Current Mental Status:  NA Loss Factors:  Legal issues Historical Factors:  Prior suicide attempts, Impulsivity, Family history of mental illness or substance abuse Risk Reduction Factors:  Living with another person, especially a relative Total Time spent with patient: 1 hour Principal Problem: Major depression Diagnosis:   Patient Active Problem List   Diagnosis Date Noted  . Major depression [F32.2] 10/03/2014  . 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 Diabetes [E11.9] 10/03/2014  . Hypertension [I10] 10/03/2014     Continued Clinical Symptoms:  Alcohol Use Disorder Identification Test Final Score (AUDIT): 0 The "Alcohol Use Disorders Identification Test", Guidelines for Use in Primary Care, Second Edition.  World Pharmacologist [employer]). Score between 0-7:  no or low risk or alcohol related problems. Score between 8-15:  moderate risk of alcohol related problems. Score between 16-19:  high risk of alcohol related problems. Score 20 or above:  warrants further diagnostic evaluation for alcohol dependence and treatment.   CLINICAL FACTORS:   Depression:   Severe   Musculoskeletal: Strength & Muscle Tone: within normal limits Gait & Station: normal Patient leans: N/A  Psychiatric Specialty Exam: Physical Exam  Nursing note and vitals reviewed. Constitutional: She is oriented to person, place, and time. She appears well-developed and well-nourished.  HENT:  Head: Normocephalic and atraumatic.  Eyes: Conjunctivae and EOM are normal. Pupils are equal, round, and reactive to light.  Neck: Normal range of motion. Neck supple.  Cardiovascular: Normal rate, regular rhythm and normal heart sounds.   Respiratory: Effort normal and breath sounds normal.  GI: Soft. Bowel sounds are normal.  Musculoskeletal: Normal range of motion.   Neurological: She is alert and oriented to person, place, and time.  Skin: Skin is warm and dry.    Review of Systems  All other systems reviewed and are negative.   Blood pressure 128/80, pulse 125, temperature 98.5 F (36.9 C), temperature source Oral, resp. rate 20, height 5\' 2"  (1.575 m), weight 75.297 kg (166 lb).Body mass index is 30.35 kg/(m^2).  General Appearance: Casual  Eye Contact::  Fair  Speech:  Slow  Volume:  Normal  Mood:  Depressed  Affect:  Tearful  Thought Process:  Goal Directed  Orientation:  Full (Time, Place, and Person)  Thought Content:  WDL  Suicidal Thoughts:  No  Homicidal Thoughts:  No  Memory:  Immediate;   Fair Recent;   Fair Remote;   Fair  Judgement:  Fair  Insight:  Fair  Psychomotor Activity:  Normal  Concentration:  Fair  Recall:  [employer]  Language: Fair  Akathisia:  No  Handed:  Right  AIMS (if indicated):     Assets:  Communication Skills Desire for Improvement Financial Resources/Insurance Housing Physical Health Resilience Social Support  Sleep:  Number of Hours: 0  Cognition: WNL  ADL's:  Intact     COGNITIVE FEATURES THAT CONTRIBUTE TO RISK:  None    SUICIDE RISK:   Mild:  Suicidal ideation of limited frequency, intensity, duration, and specificity.  There are no identifiable plans, no associated intent, mild dysphoria and related symptoms, good self-control (both objective and subjective assessment), few other risk factors, and identifiable protective factors, including available and accessible social support.  PLAN OF CARE: Hospital admission. Medication management. Discharge planning.  Medical Decision Making:  Self-Limited or Minor (1), New problem, with additional work up planned, Review of  Psycho-Social Stressors (1), Review or order clinical lab tests (1), Review of Medication Regimen & Side Effects (2) and Review of New Medication or Change in Dosage (2)   Ms. Penix is an 59 year old  female with history of depression admitted for suicidal and homicidal threats in the context of major loss and treatment noncompliance.  1. Suicidal ideation. The patient is able to contract for safety.  2. Mood. She was restarted on Celexa.  3. Diabetes. She is on Lantus 18 units daily and glipizide 5 mg daily with 2 Accu-Chek and hemoglobin A1c.  4. Hypertension. She is on lisinopril and hydrochlorothiazide.  5. Insomnia. At the patient reports that the trazodone does not work would be low-dose Ambien.  6. Disposition. At the patient claims that the gun has been removed from the house. She will return to her boyfriend. She will return to her providers at [facility].    I certify that inpatient services furnished can reasonably be expected to improve the patient's condition.   Seith Aikey 10/04/2014, 3:14 PM

## 2014-10-04 NOTE — ED Notes (Signed)
BEHAVIORAL HEALTH ROUNDING Patient sleeping: Yes.   Patient alert and oriented: Pt is sleeping.  Behavior appropriate: Pt is sleeping Nutrition and fluids offered: Pt is sleeping.  Toileting and hygiene offered: Pt is sleeping.  Sitter present: yes Law enforcement present: Yes  

## 2014-10-04 NOTE — Progress Notes (Signed)
Recreation Therapy Notes  INPATIENT RECREATION THERAPY ASSESSMENT  Patient Details Name: Kelly Doyle MRN: [id number] DOB: 1955-06-22 Today's Date: 10/04/2014  Patient Stressors: Relationship, Other (Comment) (Stressed all day at house)  Coping Skills:   Isolate, Arguments, Avoidance, Exercise, Art/Dance, Talking, Music, Sports, Other (Comment) (watch TV, deept breathing)  Personal Challenges: Anger, Communication, Concentration, Decision-Making, Expressing Yourself, Problem-Solving, Relationships, Self-Esteem/Confidence, Social Interaction, Stress Management, Time Management, Trusting Others  Leisure Interests (2+):  Individual - Other (Comment) (Play cards, watch daughter and grandson play baseball)  Awareness of Community Resources:  Yes  Community Resources:  [place], [place]  Current Use: Yes  If no, Barriers?:    Patient Strengths:  Dependable, good cook  Patient Identified Areas of Improvement:  Learn to control temper  Current Recreation Participation:  NOt too much; play with grandkids, play cards with family  Patient Goal for Hospitalization:  To get through court case and not go to jail  [place] of Residence:  [place] of Residence:  [place]   Current SI (including self-harm):  No  Current HI:  No  Consent to Intern Participation: N/A   Leonette Monarch, LRT/CTRS 10/04/2014, 5:26 PM

## 2014-10-04 NOTE — H&P (Signed)
Psychiatric Admission Assessment Adult  Patient Identification: Kelly Doyle MRN:  [id number] Date of Evaluation:  10/04/2014 Chief Complaint:  depression Principal Diagnosis: Major depression Diagnosis:   Patient Active Problem List   Diagnosis Date Noted  . Major depression [F32.2] 10/03/2014  . 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 Diabetes [E11.9] 10/03/2014  . Hypertension [I10] 10/03/2014   History of Present Illness::  Identifying data. Kelly Doyle is a 59 year old female with history of depression.   Chief complaint. "I lost my dog."   history of present illness. Kelly Doyle was hospitalized at [facility] 3 years ago. She was discharged to home on an antidepressant. She followed up with [facility]land KitchenA year or so ago, she discontinued her medications. She was doing well. She did not suffer any symptoms of depression, anxiety, or psychosis. She was fine until recently when her dog died of tick infestation. She blames her boyfriend who will not provide proper environment for the dog. She got so upset that she grabbed the boyfriend's gun and was threatening to kill him and herself. Her daughter called the police. The patient adamantly denies any intention to hurt anybody but does admit that she was hurt and med. Since the dog died she experiences some symptoms of depression with extremely poor sleep, decreased appetite, anhedonia, feeling of guilt and hopelessness worthlessness, poor energy and concentration, social isolation, crying spells. She denies symptoms suggestive of bipolar mania. She denies heightened anxiety. There are no psychotic symptoms. She denies alcohol or illicit substance use.    past psychiatric history. There went to prior hospitalizations for worsening of depression. She was tried on Prozac and Celexa. There were no suicide attempts.  Family psychiatric history. She has and with schizophrenia, mother with Alzheimer's, there are family members  with depression and anxiety as well.  Social history. She lives with her boyfriend. She is retired.    past medical history. Type 2 diabetes, hypertension.    Total Time spent with patient: 1 hour  Past Medical History:  Past Medical History  Diagnosis Date  . Diabetes mellitus without complication   . Hypertension   . Anxiety    History reviewed. No pertinent past surgical history. Family History: History reviewed. No pertinent family history. Social History:  History  Alcohol Use No     History  Drug Use No    History   Social History  . Marital Status: Single    Spouse Name: N/A  . Number of Children: N/A  . Years of Education: N/A   Social History Main Topics  . Smoking status: Never Smoker   . Smokeless tobacco: Not on file  . Alcohol Use: No  . Drug Use: No  . Sexual Activity: Yes    Birth Control/ Protection: None   Other Topics Concern  . None   Social History Narrative   Additional Social History:                          Musculoskeletal: Strength & Muscle Tone: within normal limits Gait & Station: normal Patient leans: N/A  Psychiatric Specialty Exam: Physical Exam  Nursing note and vitals reviewed.   Review of Systems  All other systems reviewed and are negative.   Blood pressure 128/80, pulse 125, temperature 98.5 F (36.9 C), temperature source Oral, resp. rate 20, height 5\' 2"  (1.575 m), weight 75.297 kg (166 lb).Body mass index is 30.35 kg/(m^2).  See SRA.  Sleep:  Number of Hours: 0   Risk to Self: Is patient at risk for suicide?: No Risk to Others:   Prior Inpatient Therapy:   Prior Outpatient Therapy:    Alcohol Screening: 1. How often do you have a drink containing alcohol?: Never 9. Have you or someone else been injured as a result of your drinking?: No 10. Has a relative or friend or a doctor or another health worker been concerned about your  drinking or suggested you cut down?: No Alcohol Use Disorder Identification Test Final Score (AUDIT): 0 Brief Intervention: AUDIT score less than 7 or less-screening does not suggest unhealthy drinking-brief intervention not indicated  Allergies:  No Known Allergies Lab Results:  Results for orders placed or performed during the hospital encounter of 10/04/14 (from the past 48 hour(s))  Hemoglobin A1c     Status: Abnormal   Collection Time: 10/04/14  6:41 AM  Result Value Ref Range   Hgb A1c MFr Bld 8.6 (H) 4.0 - 6.0 %  Glucose, capillary     Status: Abnormal   Collection Time: 10/04/14  6:59 AM  Result Value Ref Range   Glucose-Capillary 326 (H) 65 - 99 mg/dL   Current Medications: Current Facility-Administered Medications  Medication Dose Route Frequency Provider Last Rate Last Dose  . acetaminophen (TYLENOL) tablet 650 mg  650 mg Oral Q6H PRN Gonzella Lex, MD      . alum & mag hydroxide-simeth (MAALOX/MYLANTA) 200-200-20 MG/5ML suspension 30 mL  30 mL Oral Q4H PRN Gonzella Lex, MD      . citalopram (CELEXA) tablet 20 mg  20 mg Oral Daily Gonzella Lex, MD   20 mg at 10/04/14 1024  . [START ON 10/05/2014] glipiZIDE (GLUCOTROL) tablet 5 mg  5 mg Oral QAC breakfast Terea Neubauer B Raiyah Speakman, MD      . hydrochlorothiazide (HYDRODIURIL) tablet 25 mg  25 mg Oral Daily Dianey Suchy B Dontreal Miera, MD      . insulin aspart (novoLOG) injection 18 Units  18 Units Subcutaneous Q breakfast Marjie Skiff, MD   18 Units at 10/04/14 0912  . lisinopril (PRINIVIL,ZESTRIL) tablet 20 mg  20 mg Oral Daily Gonzella Lex, MD   20 mg at 10/04/14 1024  . magnesium hydroxide (MILK OF MAGNESIA) suspension 30 mL  30 mL Oral Daily PRN Gonzella Lex, MD       PTA Medications: Prescriptions prior to admission  Medication Sig Dispense Refill Last Dose  . bisacodyl (DULCOLAX) 5 MG EC tablet Take 5 mg by mouth daily as needed for moderate constipation.   10/03/2014 at 1000  . glipiZIDE (GLUCOTROL) 5 MG tablet Take 5  mg by mouth daily.   10/03/2014 at 1000  . hydrochlorothiazide (HYDRODIURIL) 25 MG tablet Take 25 mg by mouth daily.   10/03/2014 at 1000  . lisinopril (PRINIVIL,ZESTRIL) 20 MG tablet Take 20 mg by mouth daily.   10/03/2014 at 1000    Previous Psychotropic Medications: Yes   Substance Abuse History in the last 12 months:  No.    Consequences of Substance Abuse: NA  Results for orders placed or performed during the hospital encounter of 10/04/14 (from the past 72 hour(s))  Hemoglobin A1c     Status: Abnormal   Collection Time: 10/04/14  6:41 AM  Result Value Ref Range   Hgb A1c MFr Bld 8.6 (H) 4.0 - 6.0 %  Glucose, capillary     Status: Abnormal   Collection Time: 10/04/14  6:59 AM  Result Value  Ref Range   Glucose-Capillary 326 (H) 65 - 99 mg/dL    Observation Level/Precautions:  15 minute checks  Laboratory:  CBC Chemistry Profile UDS UA  Psychotherapy:    Medications:    Consultations:    Discharge Concerns:    Estimated LOS:  Other:     Psychological Evaluations: No   Treatment Plan Summary: Daily contact with patient to assess and evaluate symptoms and progress in treatment and Medication management  Medical Decision Making:  New problem, with additional work up planned, Review of Psycho-Social Stressors (1), Review or order clinical lab tests (1), Review of Medication Regimen & Side Effects (2) and Review of New Medication or Change in Dosage (2)   Ms. Penix is an 59 year old female with history of depression admitted for suicidal and homicidal threats in the context of major loss and treatment noncompliance.  1. Suicidal ideation. The patient is able to contract for safety.  2. Mood. She was restarted on Celexa.  3. Diabetes. She is on Lantus 18 units daily and glipizide 5 mg daily with 2 Accu-Chek and hemoglobin A1c.  4. Hypertension. She is on lisinopril and hydrochlorothiazide.  5. Insomnia. At the patient reports that the trazodone does not work would be  low-dose Ambien.  6. Disposition. At the patient claims that the gun has been removed from the house. She will return to her boyfriend. She will return to her providers at [facility].   I certify that inpatient services furnished can reasonably be expected to improve the patient's condition.   Breanda Greenlaw 6/16/20163:22 PM

## 2014-10-04 NOTE — Plan of Care (Signed)
Problem: Aggression Towards others,Towards Self, and or Destruction Goal: LTG - No aggression,physical/verbal/destruction prior to D/C (Patient will have no episodes of physical or verbal aggression or property destruction towards self or others for _____ day (s) prior to discharge.)  Outcome: Progressing No aggressive behavior noted

## 2014-10-04 NOTE — Progress Notes (Signed)
PTA reconciled for completeness

## 2014-10-04 NOTE — Progress Notes (Addendum)
Inpatient Diabetes Program Recommendations  AACE/ADA: New Consensus Statement on Inpatient Glycemic Control (2013)  Target Ranges:  Prepandial:   less than 140 mg/dL      Peak postprandial:   less than 180 mg/dL (1-2 hours)      Critically ill patients:  140 - 180 mg/dL   Reason for assessment: elevated am CBG  Diabetes history: Type 2 Outpatient Diabetes medications: glucotrol 5mg /day Current orders for Inpatient glycemic control: Glucotrol 5mg /day, Novolog 18 units qam  MD- please order CBG tid and consider ordering Novolog sensitive correction (0-9units) tid.    Spoke with RN, will request order for CBG tid. Please review home meds- no note in med reconciliation that patient takes Novolog at home.   Spoke to staff a second time- Dr Nicki Reaper note @3 :22pm states patient takes 18 units of Lantus/day- staff has [facility] to [facility] current home meds.  Gentry Fitz, RN, BA, MHA, CDE Diabetes Coordinator Inpatient Diabetes Program  [phone] (Team Pager) [phone] ([place]) 10/04/2014 4:19 PM

## 2014-10-04 NOTE — Plan of Care (Signed)
Problem: Ineffective individual coping Goal: LTG: Patient will report a decrease in negative feelings Outcome: Progressing Pleasant, encouraged to express feelings, still grieving about her loss (her dog of 2 years died recently) creating the desire to kill her husband and herself; mood and affect sad, unimpaired concentration, impaired insight about why she should be admitted, irrational thought processes; nursing staffs will continue to provide clinical moral support .

## 2014-10-04 NOTE — Tx Team (Signed)
Interdisciplinary Treatment Plan Update (Adult)  Date:  10/04/2014 Time Reviewed:  5:26 PM  Progress in Treatment: Attending groups: Yes. Participating in groups:  Yes. Taking medication as prescribed:  Yes. Tolerating medication:  Yes. Family/Significant othe contact made:  No, will contact:    Patient understands diagnosis:  Yes. Discussing patient identified problems/goals with staff:  Yes. Medical problems stabilized or resolved:  Yes. Denies suicidal/homicidal ideation: Yes. Issues/concerns per patient self-inventory:  Yes. Other:  New problem(s) identified: No, Describe:     Discharge Plan or Barriers:  Reason for Continuation of Hospitalization: Medication stabilization  Comments:  Estimated length of stay: 1day  New goal(s): to be discharged  Review of initial/current patient goals per problem list:   Refer to plan of care  Attendees: Patient:  Kelly Doyle 6/16/20165:26 PM  Family:   6/16/20165:26 PM  Physician:  Dr Bary Leriche 6/16/20165:26 PM  Nursing:   Mechele Collin 6/16/20165:26 PM  Case Manager:   6/16/20165:26 PM  Counselor:  Enis Slipper LCSW 6/16/20165:26 PM  [place] LCSW 6/16/20165:26 PM  Laddie Aquas Rousch Psychologist 6/16/20165:26 PM  Lawana Pai LRT 6/16/20165:26 PM   6/16/20165:26 PM   6/16/20165:26 PM   6/16/20165:26 PM  Other:  6/16/20165:26 PM  Other:  6/16/20165:26 PM  Other:  6/16/20165:26 PM  Other:   6/16/20165:26 PM   Scribe for Treatment Team:   Joana Reamer, 10/04/2014, 5:26 PM

## 2014-10-04 NOTE — Progress Notes (Signed)
Patient ID: Kelly Doyle, female   DOB: 1955-05-13, 59 y.o.   MRN: [id number] Patient admitted IVC today for HI towards her boyfriend.  Patient states that her boyfriend was supposed to put a fence in the back yard so that her dog.  However he did not so ticks killed her dog.  Patient blames her boyfriend for the death of her dog.  She warned patient that she was going to kill him.  She then went and got a gun, but was unable to figure out how to fire the weapon.  Patient has pending criminal charges for the incident.  Patient has been with her boyfriend off and on for 30 years, however she has now been living with him for 2.5 years.  Patient oriented to unit.  Patient search performed.  No contraband found.

## 2014-10-04 NOTE — Tx Team (Addendum)
Initial Interdisciplinary Treatment Plan   PATIENT STRESSORS: Marital or family conflict   PATIENT STRENGTHS: Ability for insight Capable of independent living General fund of knowledge   PROBLEM LIST: Problem List/Patient Goals Date to be addressed Date deferred Reason deferred Estimated date of resolution  Aggression 10/04/14     Homicidal Ideation 10/04/14                                                DISCHARGE CRITERIA:  Improved stabilization in mood, thinking, and/or behavior  PRELIMINARY DISCHARGE PLAN: Outpatient therapy  PATIENT/FAMIILY INVOLVEMENT: This treatment plan has been presented to and reviewed with the patient, Kelly Doyle, and/or family member.  The patient and family have been given the opportunity to ask questions and make suggestions.  Nash Mantis [facility] 10/04/2014, 4:25 AM

## 2014-10-04 NOTE — BHH Group Notes (Signed)
[facility] Group Notes:  (Nursing/MHT/Case Management/Adjunct)  Date:  10/04/2014  Time:  2:07 PM  Type of Therapy:  Movement Therapy  Participation Level:  Minimal  Participation Quality:  Appropriate and Attentive  Affect:  Appropriate  Cognitive:  Alert, Appropriate and Oriented  Insight:  Appropriate  Engagement in Group:  Engaged  Modes of Intervention:  Activity  Summary of Progress/Problems:  Kelly Doyle 10/04/2014, 2:07 PM

## 2014-10-05 LAB — GLUCOSE, CAPILLARY
GLUCOSE-CAPILLARY: 223 mg/dL — AB (ref 65–99)
Glucose-Capillary: 200 mg/dL — ABNORMAL HIGH (ref 65–99)

## 2014-10-05 MED ORDER — BISACODYL 5 MG PO TBEC: 5.0000 mg | DELAYED_RELEASE_TABLET | Freq: Every day | ORAL | Status: DC | PRN

## 2014-10-05 MED ORDER — TRAZODONE HCL 100 MG PO TABS: 100.0000 mg | ORAL_TABLET | Freq: Every evening | ORAL | Status: DC | PRN

## 2014-10-05 MED ORDER — HYDROCHLOROTHIAZIDE 25 MG PO TABS: 25.0000 mg | ORAL_TABLET | Freq: Every day | ORAL | Status: DC

## 2014-10-05 MED ORDER — INSULIN DETEMIR 100 UNIT/ML ~~LOC~~ SOLN: 18.0000 [IU] | Freq: Every day | SUBCUTANEOUS | Status: DC

## 2014-10-05 MED ORDER — GLIPIZIDE 5 MG PO TABS: 5.0000 mg | ORAL_TABLET | Freq: Every day | ORAL | Status: DC

## 2014-10-05 MED ORDER — CITALOPRAM HYDROBROMIDE 20 MG PO TABS: 20.0000 mg | ORAL_TABLET | Freq: Every day | ORAL | Status: DC

## 2014-10-05 MED ORDER — LISINOPRIL 20 MG PO TABS: 20.0000 mg | ORAL_TABLET | Freq: Every day | ORAL | Status: DC

## 2014-10-05 NOTE — Plan of Care (Signed)
Problem: Ineffective individual coping Goal: LTG: Patient will report a decrease in negative feelings Outcome: Not Progressing Patient feeling increased depression, anxiety, and guilt.  Goal: STG: Patient will remain free from self harm Outcome: Progressing No self-harm noted.  Goal: STG:Pt. will utilize relaxation techniques to reduce stress STG: Patient will utilize relaxation techniques to reduce stress levels  Outcome: Not Progressing Patient having uncontrolled anxiety, given Ativan which was effective.

## 2014-10-05 NOTE — Progress Notes (Signed)
  [facility] Adult Case Management Discharge Plan :  Will you be returning to the same living situation after discharge:  Yes,    At discharge, do you have transportation home?: Yes,    Do you have the ability to pay for your medications: Yes,     Release of information consent forms completed and in the chart;  Patient's signature needed at discharge.  Patient to Follow up at: Follow-up Information    Follow up with [facility]- Walk in clinic In 1 week.   Why:  Hospital Discharge Follow up Please access the walk in clinic from 7:45am-3pm Mon,Wed,Friday. For additional assistance contact Sherrian Divers [phone]   Contact information:   [place] [phone] Fax [phone]      Patient denies SI/HI: Yes,       Safety Planning and Suicide Prevention discussed: Yes,  1-1 support provided in addition a suicide prevention education handout  Have you used any form of tobacco in the last 30 days? (Cigarettes, Smokeless Tobacco, Cigars, and/or Pipes): No  Has patient been referred to the Quitline?: N/A patient is not a smoker  Pauletta Browns, Kariss Longmire M 10/05/2014, 8:02 AM

## 2014-10-05 NOTE — Progress Notes (Signed)
At approximately 19:05 the patient came to the nurse's station and noted she felt like something was wrong with her body, noting numbness and tingling on her left side. The patient was visibly upset and tense. Vitals were attempted but the patient had to be calmed before any readings could be obtained. (See VS flow sheet). At 19:20 a rapid response was called. Dr. Gretel Acre gave an order for Ativan and it was administered to the patient. After several minutes the patient noted feeling calmer and her vitals improved. When her vitals were rechecked, her BP was found to be elevated (172/91) and Dr. Gretel Acre was contacted. She ordered a one time dose of Clonidine 0.1mg . When this writer went to give the medicine, the patient was asleep and her vitals were retaken. Her BP had dropped to 124/63. The Clonidine was not given and the patient was allowed to sleep. No more distress was noted during the night. She notes feeling upset about her future and the possibility of going to jail. She is upset for brining all of this trouble on herself. She denies current SI, HI, and AVH.

## 2014-10-05 NOTE — BHH Group Notes (Signed)
[facility] LCSW Aftercare Discharge Planning Group Note  10/05/2014 11:19 AM  Participation Quality:  Appropriate  Affect:  Appropriate  Cognitive:  Appropriate  Insight:  Engaged  Engagement in Group:  Engaged  Modes of Intervention:  Discussion, Education and Support  Summary of Progress/Problems: patient goal is to remain calm in stressful situations and find good coping skills when things get out of hand  Kelly Doyle, [place] 10/05/2014, 11:19 AM

## 2014-10-05 NOTE — Plan of Care (Signed)
Problem: [facility] Participation in Recreation Therapeutic Interventions Goal: STG-Patient will demonstrate improved self esteem by identif STG: Self-Esteem - Within 2 treatment sessions, patient will verbalize at least 5 positive affirmation statements in one treatment session to increase self-esteem post d/c.  Outcome: Completed/Met Date Met:  10/05/14 Treatment Session 1; Completed 1 out of 1: At approximately 12:35 pm, LRT met with patient in craft room. Patient verbalized 5 positive affirmation statements. Patient reported it felt "good". LRT encouraged patient to continue saying positive affirmation statements.  Leonette Monarch, LRT/CTRS 06.17.16 1:33 pm Goal: STG-Patient will identify at least five coping skills for ** STG: Coping Skills - Within 2 treatment sessions, patient will verbalize at least 5 coping skills for anger in one treatment session to increase anger management skills post d/c.  Outcome: Completed/Met Date Met:  10/05/14 Treatment Session 1; Completed 1 out of 1: At approximately 12:35 pm, LRT met with patient in craft room. Patient verbalized 5 coping skills for anger. Patient verbalized what triggers her to get angry, how her body responds to anger, and how she is going to remember to use her healthy coping skills. LRT provided suggestions as well. 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   Leonette Monarch, LRT/CTRS 06.17.16 1:34 pm Goal: STG-Other Recreation Therapy Goal (Specify) STG: Stress Management - Within 2 treatment sessions, patient will verbalize understanding of stress management techniques in one treatment session to increase stress management skills post d/c.  Outcome: Completed/Met Date Met:  10/05/14 Treatment Session 1; Completed 1 out of 1: At approximately 12:35 pm, LRT met with patient in craft room. LRT educated and provided patient with handouts on stress management techniques. Patient verbalized understanding. LRT encouraged patient to  read over and practice the stress management techniques.  Leonette Monarch, LRT/CTRS 06.17.16 1:36 pm

## 2014-10-05 NOTE — BHH Group Notes (Signed)
[facility] Group Notes:  (Nursing/MHT/Case Management/Adjunct)  Date:  10/05/2014  Time:  12:03 PM  Type of Therapy:  Group Therapy  Participation Level:  Active  Participation Quality:  Attentive and Sharing  Affect:  Appropriate  Cognitive:  Lacking  Insight:  Lacking and Limited  Engagement in Group:  Engaged  Modes of Intervention:  Discussion, Education and Support  Summary of Progress/Problems:  Kelly Doyle 10/05/2014, 12:03 PM

## 2014-10-05 NOTE — Progress Notes (Signed)
Pleasant and cooperative.  Denies SI and hallucinations and depression.  Professes to some anxiety. Discharge instructions given, verbalized understanding.  Prescriptions and seven day supply of medications given.  Escorted off unit by this Probation officer to meet friend to travel home.

## 2014-10-05 NOTE — BHH Suicide Risk Assessment (Signed)
[facility] Discharge Suicide Risk Assessment   Demographic Factors:  Low socioeconomic status and Unemployed  Total Time spent with patient: 30 minutes  Musculoskeletal: Strength & Muscle Tone: within normal limits Gait & Station: normal Patient leans: N/A  Psychiatric Specialty Exam: Physical Exam  Nursing note and vitals reviewed.   Review of Systems  All other systems reviewed and are negative.   Blood pressure 110/77, pulse 104, temperature 98.3 F (36.8 C), temperature source Oral, resp. rate 20, height 5\' 2"  (1.575 m), weight 75.297 kg (166 lb), SpO2 99 %.Body mass index is 30.35 kg/(m^2).  General Appearance: Casual  Eye Contact::  Good  Speech:  Clear and [id number]  Volume:  Normal  Mood:  Euthymic  Affect:  Appropriate  Thought Process:  Goal Directed and Logical  Orientation:  Full (Time, Place, and Person)  Thought Content:  WDL  Suicidal Thoughts:  No  Homicidal Thoughts:  No  Memory:  Immediate;   Fair Recent;   Fair Remote;   Fair  Judgement:  Fair  Insight:  Fair  Psychomotor Activity:  Normal  Concentration:  Fair  Recall:  [employer] [place]  Language: Fair  Akathisia:  No  Handed:  Right  AIMS (if indicated):     Assets:  Communication Skills Desire for Improvement Housing Social Support  Sleep:  Number of Hours: 8.25  Cognition: WNL  ADL's:  Intact   Have you used any form of tobacco in the last 30 days? (Cigarettes, Smokeless Tobacco, Cigars, and/or Pipes): No  Has this patient used any form of tobacco in the last 30 days? (Cigarettes, Smokeless Tobacco, Cigars, and/or Pipes) No  Mental Status Per Nursing Assessment::   On Admission:  NA  Current Mental Status by Physician: NA  Loss Factors: Loss of significant relationship  Historical Factors: Impulsivity  Risk Reduction Factors:   Sense of responsibility to family, Living with another person, especially a relative and Positive social support  Continued Clinical  Symptoms:  Depression:   Severe  Cognitive Features That Contribute To Risk:  None    Suicide Risk:  Minimal: No identifiable suicidal ideation.  Patients presenting with no risk factors but with morbid ruminations; may be classified as minimal risk based on the severity of the depressive symptoms  Principal Problem: Major depressive disorder, single episode, severe without psychotic features Discharge Diagnoses:  Patient Active Problem List   Diagnosis Date Noted  . Major depressive disorder, single episode, severe without psychotic features [F32.2] 10/03/2014  . 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 Diabetes [E11.9] 10/03/2014  . Hypertension [I10] 10/03/2014    Follow-up Information    Follow up with [facility]- Walk in clinic In 1 week.   Why:  Hospital Discharge Follow up Please access the walk in clinic from 7:45am-3pm Mon,Wed,Friday. For additional assistance contact Sherrian Divers [phone]   Contact information:   [place] [phone] Fax [phone]      Plan Of Care/Follow-up recommendations:  Activity as tolerated, diet low-carb low-sodium heart healthy. Keep follow-up appointments.  Is patient on multiple antipsychotic therapies at discharge:  No   Has Patient had three or more failed trials of antipsychotic monotherapy by history:  No  Recommended Plan for Multiple Antipsychotic Therapies: NA    Kaiel Weide 10/05/2014, 12:59 PMshe is is she has since she is

## 2014-10-05 NOTE — Discharge Summary (Signed)
Physician Discharge Summary Note  Patient:  Kelly Doyle is an 59 y.o., female MRN:  [id number] DOB:  Jan 01, 1956 Patient phone:  [phone] (home)  Patient address:   [address] [place] [address],  Total Time spent with patient: 30 minutes  Date of Admission:  10/04/2014 Date of Discharge: 10/05/2014  Reason for Admission:  Agitation and suicidal, homicidal ideation.  Identifying data. Kelly Doyle is a 59 year old female with history of depression.   Chief complaint. "I lost my dog."  history of present illness. Kelly Doyle was hospitalized at [facility] years ago. She was discharged to home on an antidepressant. She followed up with [facility].Marland KitchenA year or so ago, she discontinued her medications. She was doing well. She did not suffer any symptoms of depression, anxiety, or psychosis. She was fine until recently when her dog died of tick infestation. She blames her boyfriend who will not provide proper environment for the dog. She got so upset that she grabbed the boyfriend's gun and was threatening to kill him and herself. Her daughter called the police. The patient adamantly denies any intention to hurt anybody but does admit that she was hurt and med. Since the dog died she experiences some symptoms of depression with extremely poor sleep, decreased appetite, anhedonia, feeling of guilt and hopelessness worthlessness, poor energy and concentration, social isolation, crying spells. She denies symptoms suggestive of bipolar mania. She denies heightened anxiety. There are no psychotic symptoms. She denies alcohol or illicit substance use.   past psychiatric history. There went to prior hospitalizations for worsening of depression. She was tried on Prozac and Celexa. There were no suicide attempts.  Family psychiatric history. She has and with schizophrenia, mother with Alzheimer's, there are family members with depression and anxiety as well.  Social  history. She lives with her boyfriend. She is retired.   past medical history. Type 2 diabetes, hypertension.   Principal Problem: Major depressive disorder, single episode, severe without psychotic features Discharge Diagnoses: Patient Active Problem List   Diagnosis Date Noted  . Major depressive disorder, single episode, severe without psychotic features [F32.2] 10/03/2014  . 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 Diabetes [E11.9] 10/03/2014  . Hypertension [I10] 10/03/2014    Musculoskeletal: Strength & Muscle Tone: within normal limits Gait & Station: normal Patient leans: N/A  Psychiatric Specialty Exam: Physical Exam  Nursing note and vitals reviewed.   Review of Systems  All other systems reviewed and are negative.   Blood pressure 110/77, pulse 104, temperature 98.3 F (36.8 C), temperature source Oral, resp. rate 20, height 5\' 2"  (1.575 m), weight 75.297 kg (166 lb), SpO2 99 %.Body mass index is 30.35 kg/(m^2).  See SRA.                                                  Sleep:  Number of Hours: 8.25   Have you used any form of tobacco in the last 30 days? (Cigarettes, Smokeless Tobacco, Cigars, and/or Pipes): No  Has this patient used any form of tobacco in the last 30 days? (Cigarettes, Smokeless Tobacco, Cigars, and/or Pipes) No  Past Medical History:  Past Medical History  Diagnosis Date  . Diabetes mellitus without complication   . Hypertension   . Anxiety    History reviewed. No pertinent past surgical history. Family History: History reviewed. No  pertinent family history. Social History:  History  Alcohol Use No     History  Drug Use No    History   Social History  . Marital Status: Single    Spouse Name: N/A  . Number of Children: N/A  . Years of Education: N/A   Social History Main Topics  . Smoking status: Never Smoker   . Smokeless tobacco: Not on file  . Alcohol Use: No  . Drug Use: No  . Sexual Activity:  Yes    Birth Control/ Protection: None   Other Topics Concern  . None   Social History Narrative    Past Psychiatric History: Hospitalizations:  Outpatient Care:  Substance Abuse Care:  Self-Mutilation:  Suicidal Attempts:  Violent Behaviors:   Risk to Self: Is patient at risk for suicide?: No Risk to Others:   Prior Inpatient Therapy:   Prior Outpatient Therapy:    Level of Care:  OP  Hospital Course:   Ms. Heinz Doyle is an 59 year old female with history of depression admitted for suicidal and homicidal threats in the context of major loss and treatment noncompliance.  1. Suicidal ideation. This has resolved. The patient is able to contract for safety.  2. Mood. She was restarted on Celexa.  3. Diabetes. She is on Levemir 18 units daily and glipizide 5 mg daily.   4. Hypertension. She is on lisinopril and hydrochlorothiazide.  5. Insomnia. She responded well to trazodone. .   6. Disposition. At the patient was discharged to home with family. She will follow up with [facility].   Consults:  None  Significant Diagnostic Studies:  None  Discharge Vitals:   Blood pressure 110/77, pulse 104, temperature 98.3 F (36.8 C), temperature source Oral, resp. rate 20, height 5\' 2"  (1.575 m), weight 75.297 kg (166 lb), SpO2 99 %. Body mass index is 30.35 kg/(m^2). Lab Results:   Results for orders placed or performed during the hospital encounter of 10/04/14 (from the past 72 hour(s))  Hemoglobin A1c     Status: Abnormal   Collection Time: 10/04/14  6:41 AM  Result Value Ref Range   Hgb A1c MFr Bld 8.6 (H) 4.0 - 6.0 %  Glucose, capillary     Status: Abnormal   Collection Time: 10/04/14  6:59 AM  Result Value Ref Range   Glucose-Capillary 326 (H) 65 - 99 mg/dL  Glucose, capillary     Status: Abnormal   Collection Time: 10/04/14  4:17 PM  Result Value Ref Range   Glucose-Capillary 202 (H) 65 - 99 mg/dL  Glucose, capillary     Status: Abnormal   Collection Time: 10/04/14  7:21 PM   Result Value Ref Range   Glucose-Capillary 329 (H) 65 - 99 mg/dL   Comment 1 Notify RN   Glucose, capillary     Status: Abnormal   Collection Time: 10/04/14  9:00 PM  Result Value Ref Range   Glucose-Capillary 261 (H) 65 - 99 mg/dL  Glucose, capillary     Status: Abnormal   Collection Time: 10/05/14  6:44 AM  Result Value Ref Range   Glucose-Capillary 223 (H) 65 - 99 mg/dL  Glucose, capillary     Status: Abnormal   Collection Time: 10/05/14 11:59 AM  Result Value Ref Range   Glucose-Capillary 200 (H) 65 - 99 mg/dL    Physical Findings: AIMS: Facial and Oral Movements Muscles of Facial Expression: None, normal Lips and Perioral Area: None, normal Jaw: None, normal Tongue: None, normal,Extremity Movements Upper (arms, wrists, hands,  fingers): None, normal Lower (legs, knees, ankles, toes): None, normal, Trunk Movements Neck, shoulders, hips: None, normal, Overall Severity Severity of abnormal movements (highest score from questions above): None, normal Incapacitation due to abnormal movements: None, normal Patient's awareness of abnormal movements (rate only patient's report): No Awareness, Dental Status Current problems with teeth and/or dentures?: No Does patient usually wear dentures?: No  CIWA:    COWS:      See Psychiatric Specialty Exam and Suicide Risk Assessment completed by Attending Physician prior to discharge.  Discharge destination:  Home  Is patient on multiple antipsychotic therapies at discharge:  No   Has Patient had three or more failed trials of antipsychotic monotherapy by history:  No    Recommended Plan for Multiple Antipsychotic Therapies: NA  Discharge Instructions    Diet - low sodium heart healthy    Complete by:  As directed      Increase activity slowly    Complete by:  As directed             Medication List    TAKE these medications      Indication   bisacodyl 5 MG EC tablet  Commonly known as:  DULCOLAX  Take 5 mg by mouth  daily as needed for moderate constipation.      citalopram 20 MG tablet  Commonly known as:  CELEXA  Take 1 tablet (20 mg total) by mouth daily.   Indication:  Depression     glipiZIDE 5 MG tablet  Commonly known as:  GLUCOTROL  Take 1 tablet (5 mg total) by mouth daily.   Indication:  Type 2 Diabetes     hydrochlorothiazide 25 MG tablet  Commonly known as:  HYDRODIURIL  Take 1 tablet (25 mg total) by mouth daily.   Indication:  High Blood Pressure     insulin detemir 100 UNIT/ML injection  Commonly known as:  LEVEMIR  Inject 0.18 mLs (18 Units total) into the skin daily.      lisinopril 20 MG tablet  Commonly known as:  PRINIVIL,ZESTRIL  Take 1 tablet (20 mg total) by mouth daily.   Indication:  High Blood Pressure     traZODone 100 MG tablet  Commonly known as:  DESYREL  Take 1 tablet (100 mg total) by mouth at bedtime as needed for sleep.   Indication:  Trouble Sleeping           Follow-up Information    Follow up with [facility]- Walk in clinic In 1 week.   Why:  Hospital Discharge Follow up Please access the walk in clinic from 7:45am-3pm Mon,Wed,Friday. For additional assistance contact Sherrian Divers [phone]   Contact information:   [place] [phone] Fax [phone]      Follow-up recommendations:  Activity:  As tolerated. Diet:  Low-sodium low-carb heart healthy Other:  Keep follow-up appointments.  Comments:     Total Discharge Time: 35 min.  Signed: Orson Slick 10/05/2014, 1:07 PM

## 2014-10-05 NOTE — Progress Notes (Signed)
Inpatient Diabetes Program Recommendations  AACE/ADA: New Consensus Statement on Inpatient Glycemic Control (2013)  Target Ranges:  Prepandial:   less than 140 mg/dL      Peak postprandial:   less than 180 mg/dL (1-2 hours)      Critically ill patients:  140 - 180 mg/dL   Reason for assessment: elevated CBG  Diabetes history: Type 2 Outpatient Diabetes medications: glucotrol 5mg /day Current orders for Inpatient glycemic control: Glucotrol 5mg /day, Lantus 18 units qam to begin this am.   MD- please  consider ordering Novolog sensitive correction scale (0-9units) tid and hs (0-5 units)   Please review home meds- no note in med reconciliation that patient takes Lantus at home.   Gentry Fitz, RN, BA, MHA, CDE Diabetes Coordinator Inpatient Diabetes Program  [phone] (Team Pager) [phone] ([place]) 10/05/2014 7:11 AM

## 2014-10-05 NOTE — Progress Notes (Signed)
Recreation Therapy Notes  INPATIENT RECREATION TR PLAN  Patient Details Name: Kelly Doyle MRN: [id number] DOB: October 12, 1955 Today's Date: 10/05/2014  Rec Therapy Plan Is patient appropriate for Therapeutic Recreation?: Yes Treatment times per week: At least 2 times a week TR Treatment/Interventions: 1:1 session, Group participation (Comment) (Appropriate participation in daily recreation therapy tx)  Discharge Criteria Pt will be discharged from therapy if:: Discharged Treatment plan/goals/alternatives discussed and agreed upon by:: Patient/family  Discharge Summary Short term goals set: See Care Plan Short term goals met: Complete Progress toward goals comments: One-to-one attended Which groups?: Leisure education, Coping skills One-to-one attended: Self-esteem, stress management, anger management Reason goals not met: N/A Therapeutic equipment acquired: None Reason patient discharged from therapy: Discharge from hospital Pt/family agrees with progress & goals achieved: Yes Date patient discharged from therapy: 10/05/14   Leonette Monarch, LRT/CTRS 10/05/2014, 1:37 PM

## 2014-10-05 NOTE — [facility] Group Notes (Signed)
[facility] Group Notes:  (Nursing/MHT/Case Management/Adjunct)  Date:  10/05/2014  Time:  1:34 AM  Type of Therapy:  Group Therapy  Participation Level:  Did Not Attend    Summary of Progress/Problems:  Kelly Doyle 10/05/2014, 1:34 AM

## 2014-10-05 NOTE — Progress Notes (Signed)
[facility] H&P Discharge Summary faxed to [facility] for hospital follow-up

## 2014-12-27 ENCOUNTER — Other Ambulatory Visit: Payer: Self-pay

## 2015-01-03 ENCOUNTER — Ambulatory Visit: Payer: Self-pay

## 2015-02-05 ENCOUNTER — Ambulatory Visit: Payer: Self-pay

## 2015-05-09 ENCOUNTER — Ambulatory Visit: Payer: Self-pay

## 2015-05-13 ENCOUNTER — Other Ambulatory Visit: Payer: Self-pay | Admitting: Nurse Practitioner

## 2015-05-13 DIAGNOSIS — K8020 Calculus of gallbladder without cholecystitis without obstruction: Secondary | ICD-10-CM

## 2015-05-13 DIAGNOSIS — K8050 Calculus of bile duct without cholangitis or cholecystitis without obstruction: Secondary | ICD-10-CM

## 2015-05-14 ENCOUNTER — Other Ambulatory Visit: Payer: Self-pay

## 2015-05-16 ENCOUNTER — Ambulatory Visit: Payer: Self-pay | Admitting: Ophthalmology

## 2015-05-16 ENCOUNTER — Other Ambulatory Visit: Payer: Self-pay

## 2015-05-16 LAB — HEPATIC FUNCTION PANEL
ALT: 13 U/L (ref 7–35)
AST: 13 U/L (ref 13–35)
Alkaline Phosphatase: 66 U/L (ref 25–125)
BILIRUBIN, TOTAL: 0.2 mg/dL

## 2015-05-16 LAB — CBC AND DIFFERENTIAL
HCT: 33 % — AB (ref 36–46)
HEMOGLOBIN: 10.5 g/dL — AB (ref 12.0–16.0)
NEUTROS ABS: 4 /uL
Platelets: 285 10*3/uL (ref 150–399)
WBC: 7.9 10^3/mL

## 2015-05-16 LAB — LIPID PANEL
CHOLESTEROL: 215 mg/dL — AB (ref 0–200)
HDL: 53 mg/dL (ref 35–70)
LDL CALC: 118 mg/dL
TRIGLYCERIDES: 221 mg/dL — AB (ref 40–160)

## 2015-05-16 LAB — TSH: TSH: 1.53 u[IU]/mL (ref 0.41–5.90)

## 2015-05-16 LAB — HM DIABETES EYE EXAM

## 2015-05-16 LAB — BASIC METABOLIC PANEL
BUN: 29 mg/dL — AB (ref 4–21)
Creatinine: 1.6 mg/dL — AB (ref 0.5–1.1)
Glucose: 250 mg/dL
Potassium: 5 mmol/L (ref 3.4–5.3)
SODIUM: 139 mmol/L (ref 137–147)

## 2015-05-16 LAB — HEMOGLOBIN A1C: Hemoglobin A1C: 8.7

## 2015-05-20 ENCOUNTER — Ambulatory Visit: Admission: RE | Admit: 2015-05-20 | Payer: Medicaid Other | Source: Ambulatory Visit

## 2015-05-21 ENCOUNTER — Ambulatory Visit: Payer: Self-pay

## 2015-07-09 ENCOUNTER — Ambulatory Visit: Payer: Self-pay | Admitting: Endocrinology

## 2015-07-09 VITALS — BP 128/82 | HR 104 | Temp 97.8°F | Wt 163.0 lb

## 2015-07-09 DIAGNOSIS — E119 Type 2 diabetes mellitus without complications: Secondary | ICD-10-CM

## 2015-07-09 DIAGNOSIS — Z794 Long term (current) use of insulin: Principal | ICD-10-CM

## 2015-07-09 LAB — GLUCOSE, POCT (MANUAL RESULT ENTRY): POC Glucose: 410 mg/dl — AB (ref 70–99)

## 2015-07-09 NOTE — Progress Notes (Signed)
HPI She complains of some acid reflux symptoms recently.  She was seen in the ED for these symptoms and placed on Prevacid.  More recently she was placed on anti-emetics due to waking up feeling like she is going to vomit at night.  These medications have helped.  Denies a sensation of food or fluid getting stuck.  She admits to sometimes forgetting to take her insulin.  She forgot last night.    She is not checking BG very often.  She reports BG running in the mid-200s on average when she does check, and up to 350, although it is over 400 in the clinic today.  No readings below 150.  She reports some nocturia but otherwise denies polyuria.  She does drink a large amount of fluids.  Denies numbness or tingling in hands or feet.  Physical Exam Heart: Slightly tachycardic rate.  Regular rhythm Lungs: Clear to auscultation bilaterally Feet: Sensation intact to monofilament on bilateral barefoot exam.  No ulcers or sores.

## 2015-07-09 NOTE — Progress Notes (Signed)
Patient's primary care provider: [facility]   Kelly Doyle returns for follow-up of type 2 diabetes.    Other problems include:  Patient Active Problem List   Diagnosis Date Noted  . Major depressive disorder, single episode, severe without psychotic features ([place]) 10/03/2014  . 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 Diabetes ([place]) 10/03/2014  . Hypertension 10/03/2014    Her current medications include: Current Outpatient Prescriptions  Medication Sig Dispense Refill  . amLODipine (NORVASC) 5 MG tablet Take 5 mg by mouth daily.    . citalopram (CELEXA) 20 MG tablet Take 1 tablet (20 mg total) by mouth daily. 30 tablet 0  . hydrochlorothiazide (HYDRODIURIL) 25 MG tablet Take 1 tablet (25 mg total) by mouth daily. 30 tablet 0  . insulin detemir (LEVEMIR) 100 UNIT/ML injection Inject 0.18 mLs (18 Units total) into the skin daily. 10 mL 11  . lansoprazole (PREVACID) 15 MG capsule Take 15 mg by mouth daily at 12 noon.    Marland Kitchen lisinopril (PRINIVIL,ZESTRIL) 20 MG tablet Take 1 tablet (20 mg total) by mouth daily. 30 tablet 0  . bisacodyl (DULCOLAX) 5 MG EC tablet Take 1 tablet (5 mg total) by mouth daily as needed for moderate constipation. (Patient not taking: Reported on 07/09/2015) 30 tablet 0  . cefdinir (OMNICEF) 300 MG capsule Take 300 mg by mouth 2 (two) times daily. Reported on 07/09/2015    . glipiZIDE (GLUCOTROL) 5 MG tablet Take 1 tablet (5 mg total) by mouth daily. (Patient not taking: Reported on 07/09/2015) 30 tablet 0  . ranitidine (ZANTAC) 150 MG tablet Take 150 mg by mouth 2 (two) times daily. Reported on 07/09/2015    . traZODone (DESYREL) 100 MG tablet Take 1 tablet (100 mg total) by mouth at bedtime as needed for sleep. (Patient not taking: Reported on 07/09/2015) 30 tablet 0   No current facility-administered medications for this visit.    Since the last visit, she admits that she rarely takes her insulin. Takes it perhaps 2-3 times per week (one dose). Doesn't have pain  with injections, just "doesn't like it." She stopped glipizide when she started insulin.  Blood sugars range from 150 to 350. No lows with insulin.   Exam:  BP 128/82 mmHg  Pulse 104  Temp(Src) 97.8 F (36.6 C)  Wt 163 lb (73.936 kg) Constitutional: Obese, Alert, oriented, in NAD Remainder of PE per triage note    Recent labs:  Results for orders placed or performed in visit on 07/09/15  POCT glucose (manual entry)  Result Value Ref Range   POC Glucose 410 (A) 70 - 99 mg/dl   POC A1C 9.3%.  Assessment and Plan:    1. Type 2 diabetes mellitus without complication, with long-term current use of insulin (HCC)  She has poor control of her diabetes, mainly due to non-adherence with insulin.  Recommend:  Resume glipizide 10 mg daily.  Take Levemir 18 units at night only.    Return in about 1 month (around 08/09/2015) for group.  In the interim, she was instructed to contact the clinic for any refill needs or any problems related to her diabetes.

## 2015-07-09 NOTE — Progress Notes (Signed)
Assessment:   1. Type 2 diabetes mellitus without complication, with long-term current use of insulin (HCC)  - POCT glucose (manual entry)  POC A1c today 9.7%      Plan:    1.  Rx changes:   2.  Education: Reviewed diabetes management:  Appropriate A1C target, avoid hypoglycemia,  blood pressure (<140/90), and cholesterol (LDL <100). -   Reviewed importance of good glycemic control to reduce risk for complications.   3.   Get regular physical activity at least 30 minutes a day of moderate intensity most days of the week   4. Follow up: I recommend patient follow-up with me at 3 months.   5. Referrals:      Subjective:    Kelly Doyle is a 60 y.o. female who is seen in follow up forType 2 diabetes from 02/05/2015. She is consented for the Shared Medical Appointment and participates in the group discussion    Reports no symptoms of hypoglycemia. Denies severe hypoglycemia or admission to hospital for DKA.    No Known Allergies   Current outpatient prescriptions:  .  amLODipine (NORVASC) 5 MG tablet, Take 5 mg by mouth daily., Disp: , Rfl:  .  citalopram (CELEXA) 20 MG tablet, Take 1 tablet (20 mg total) by mouth daily., Disp: 30 tablet, Rfl: 0 .  hydrochlorothiazide (HYDRODIURIL) 25 MG tablet, Take 1 tablet (25 mg total) by mouth daily., Disp: 30 tablet, Rfl: 0 .  insulin detemir (LEVEMIR) 100 UNIT/ML injection, Inject 0.18 mLs (18 Units total) into the skin daily., Disp: 10 mL, Rfl: 11 .  lansoprazole (PREVACID) 15 MG capsule, Take 15 mg by mouth daily at 12 noon., Disp: , Rfl:  .  lisinopril (PRINIVIL,ZESTRIL) 20 MG tablet, Take 1 tablet (20 mg total) by mouth daily., Disp: 30 tablet, Rfl: 0 .  bisacodyl (DULCOLAX) 5 MG EC tablet, Take 1 tablet (5 mg total) by mouth daily as needed for moderate constipation. (Patient not taking: Reported on 07/09/2015), Disp: 30 tablet, Rfl: 0 .  cefdinir (OMNICEF) 300 MG capsule, Take 300 mg by mouth 2 (two) times daily. Reported  on 07/09/2015, Disp: , Rfl:  .  glipiZIDE (GLUCOTROL) 5 MG tablet, Take 1 tablet (5 mg total) by mouth daily. (Patient not taking: Reported on 07/09/2015), Disp: 30 tablet, Rfl: 0 .  ranitidine (ZANTAC) 150 MG tablet, Take 150 mg by mouth 2 (two) times daily. Reported on 07/09/2015, Disp: , Rfl:  .  traZODone (DESYREL) 100 MG tablet, Take 1 tablet (100 mg total) by mouth at bedtime as needed for sleep. (Patient not taking: Reported on 07/09/2015), Disp: 30 tablet, Rfl: 0  Social History   Social History  . Marital Status: Single    Spouse Name: N/A  . Number of Children: N/A  . Years of Education: N/A   Social History Main Topics  . Smoking status: Never Smoker   . Smokeless tobacco: Not on file  . Alcohol Use: No  . Drug Use: No  . Sexual Activity: Yes    Birth Control/ Protection: None   Other Topics Concern  . Not on file   Social History Narrative    Family History  Problem Relation Age of Onset  . Alzheimer's disease Mother   . Hypertension Mother   . Diabetes Sister   . Gout Brother     Review of Systems A 12 point review of systems was negative except for pertinent items noted in the HPI.   Objective:     Wt  Readings from Last 3 Encounters:  07/09/15 163 lb (73.936 kg)  05/21/15 170 lb (77.111 kg)  10/04/14 166 lb (75.297 kg)   BP 128/82 mmHg  Pulse 104  Temp(Src) 97.8 F (36.6 C)  Wt 163 lb (73.936 kg)                                            Lab Review No components found for: A1C GLUCOSE (mg/dL)  Date Value  08/17/2011 138*   GLUCOSE, BLD (mg/dL)  Date Value  10/02/2014 230*   CO2 (mmol/L)  Date Value  10/02/2014 27  08/17/2011 30   BUN (mg/dL)  Date Value  05/16/2015 29*  10/02/2014 29*  08/17/2011 23*   CREATININE (mg/dL)  Date Value  05/16/2015 1.6*  08/17/2011 1.15   CREATININE, SER (mg/dL)  Date Value  10/02/2014 1.44*   No components found for: LDL,  LDLCALC,  LDLDIRECT Lab Results  Component Value Date    NA 139 05/16/2015   K 5.0 05/16/2015   CL 102 10/02/2014   CO2 27 10/02/2014   BUN 29* 05/16/2015   CREATININE 1.6* 05/16/2015   GFRAA 45* 10/02/2014   GFRNONAA 39* 10/02/2014   GLU 250 05/16/2015   CALCIUM 10.1 10/02/2014   ALBUMIN 4.6 10/02/2014     DIABETES MELLITUS RESULTS: Lab Results  Component Value Date   HGBA1C 8.7 05/16/2015   HGBA1C 8.6* 10/04/2014   Lab Results  Component Value Date   LDLCALC 118 05/16/2015   CREATININE 1.6* 05/16/2015   Lab Results  Component Value Date   CHOL 215* 05/16/2015   Lab Results  Component Value Date   LDLCALC 118 05/16/2015   No components found for: CHOLLLDLDIRECT No components found for: MICROALB/CR Lab Results  Component Value Date   GFRAA 45* 10/02/2014   GFRNONAA 39* 10/02/2014

## 2015-08-06 ENCOUNTER — Ambulatory Visit: Payer: Self-pay

## 2015-09-09 ENCOUNTER — Encounter (INDEPENDENT_AMBULATORY_CARE_PROVIDER_SITE_OTHER): Payer: Self-pay

## 2015-09-09 ENCOUNTER — Ambulatory Visit: Payer: Self-pay

## 2015-09-12 ENCOUNTER — Ambulatory Visit: Payer: Self-pay

## 2015-09-19 ENCOUNTER — Ambulatory Visit: Payer: Self-pay | Admitting: Nurse Practitioner

## 2015-09-19 VITALS — BP 125/75 | HR 99 | Wt 167.0 lb

## 2015-09-19 DIAGNOSIS — E119 Type 2 diabetes mellitus without complications: Secondary | ICD-10-CM

## 2015-09-19 DIAGNOSIS — E782 Mixed hyperlipidemia: Secondary | ICD-10-CM

## 2015-09-19 DIAGNOSIS — D509 Iron deficiency anemia, unspecified: Secondary | ICD-10-CM

## 2015-09-19 DIAGNOSIS — R5383 Other fatigue: Secondary | ICD-10-CM

## 2015-09-19 DIAGNOSIS — Z794 Long term (current) use of insulin: Secondary | ICD-10-CM

## 2015-09-19 LAB — GLUCOSE, POCT (MANUAL RESULT ENTRY): POC Glucose: 141 mg/dl — AB (ref 70–99)

## 2015-09-19 MED ORDER — GLIPIZIDE 5 MG PO TABS: 10.0000 mg | ORAL_TABLET | Freq: Every day | ORAL | Status: DC

## 2015-09-19 NOTE — Progress Notes (Signed)
   Subjective:    Patient ID: Kelly Doyle, female    DOB: April 24, 1955, 60 y.o.   MRN: [id number]  HPI    Review of Systems     Objective:   Physical Exam        Assessment & Plan:  Pt has not had any labs drawn since January, will order labs tonite and renew meds.    Will bring pt back in 1-2 weeks to review labs and adjust treatment as indicated.

## 2015-09-20 LAB — CBC WITH DIFFERENTIAL/PLATELET
BASOS ABS: 0 10*3/uL (ref 0.0–0.2)
Basos: 0 %
EOS (ABSOLUTE): 0.2 10*3/uL (ref 0.0–0.4)
Eos: 3 %
Hematocrit: 35.8 % (ref 34.0–46.6)
Hemoglobin: 11.6 g/dL (ref 11.1–15.9)
Immature Grans (Abs): 0 10*3/uL (ref 0.0–0.1)
Immature Granulocytes: 0 %
LYMPHS ABS: 3.8 10*3/uL — AB (ref 0.7–3.1)
Lymphs: 43 %
MCH: 29.5 pg (ref 26.6–33.0)
MCHC: 32.4 g/dL (ref 31.5–35.7)
MCV: 91 fL (ref 79–97)
Monocytes Absolute: 0.6 10*3/uL (ref 0.1–0.9)
Monocytes: 7 %
NEUTROS ABS: 4.2 10*3/uL (ref 1.4–7.0)
Neutrophils: 47 %
PLATELETS: 333 10*3/uL (ref 150–379)
RBC: 3.93 x10E6/uL (ref 3.77–5.28)
RDW: 12.9 % (ref 12.3–15.4)
WBC: 8.9 10*3/uL (ref 3.4–10.8)

## 2015-09-20 LAB — COMPREHENSIVE METABOLIC PANEL
ALBUMIN: 4.4 g/dL (ref 3.6–4.8)
ALK PHOS: 70 IU/L (ref 39–117)
ALT: 14 IU/L (ref 0–32)
AST: 13 IU/L (ref 0–40)
Albumin/Globulin Ratio: 1.5 (ref 1.2–2.2)
BUN / CREAT RATIO: 25 (ref 12–28)
BUN: 32 mg/dL — AB (ref 8–27)
CHLORIDE: 101 mmol/L (ref 96–106)
CO2: 25 mmol/L (ref 18–29)
Calcium: 10.4 mg/dL — ABNORMAL HIGH (ref 8.7–10.3)
Creatinine, Ser: 1.29 mg/dL — ABNORMAL HIGH (ref 0.57–1.00)
GFR calc Af Amer: 52 mL/min/{1.73_m2} — ABNORMAL LOW (ref >59)
GFR calc non Af Amer: 45 mL/min/{1.73_m2} — ABNORMAL LOW (ref >59)
GLUCOSE: 149 mg/dL — AB (ref 65–99)
Globulin, Total: 3 g/dL (ref 1.5–4.5)
Potassium: 4.7 mmol/L (ref 3.5–5.2)
Sodium: 145 mmol/L — ABNORMAL HIGH (ref 134–144)
Total Protein: 7.4 g/dL (ref 6.0–8.5)

## 2015-09-20 LAB — LIPID PANEL
CHOL/HDL RATIO: 4.1 ratio (ref 0.0–4.4)
Cholesterol, Total: 224 mg/dL — ABNORMAL HIGH (ref 100–199)
HDL: 55 mg/dL (ref >39)
LDL Calculated: 95 mg/dL (ref 0–99)
Triglycerides: 370 mg/dL — ABNORMAL HIGH (ref 0–149)
VLDL Cholesterol Cal: 74 mg/dL — ABNORMAL HIGH (ref 5–40)

## 2015-09-20 LAB — HEMOGLOBIN A1C
Est. average glucose Bld gHb Est-mCnc: 200 mg/dL
HEMOGLOBIN A1C: 8.6 % — AB (ref 4.8–5.6)

## 2015-09-20 LAB — TSH: TSH: 1.42 u[IU]/mL (ref 0.450–4.500)

## 2015-09-20 LAB — FERRITIN: Ferritin: 239 ng/mL — ABNORMAL HIGH (ref 15–150)

## 2015-10-03 ENCOUNTER — Ambulatory Visit: Payer: Self-pay | Admitting: Family Medicine

## 2015-10-03 VITALS — BP 120/74 | HR 93 | Temp 98.2°F | Resp 16 | Wt 165.0 lb

## 2015-10-03 DIAGNOSIS — E119 Type 2 diabetes mellitus without complications: Secondary | ICD-10-CM

## 2015-10-03 DIAGNOSIS — I10 Essential (primary) hypertension: Secondary | ICD-10-CM

## 2015-10-03 DIAGNOSIS — M5432 Sciatica, left side: Secondary | ICD-10-CM

## 2015-10-03 DIAGNOSIS — Z794 Long term (current) use of insulin: Principal | ICD-10-CM

## 2015-10-03 DIAGNOSIS — E785 Hyperlipidemia, unspecified: Secondary | ICD-10-CM | POA: Insufficient documentation

## 2015-10-03 DIAGNOSIS — D509 Iron deficiency anemia, unspecified: Secondary | ICD-10-CM

## 2015-10-03 MED ORDER — ATORVASTATIN CALCIUM 40 MG PO TABS: 40.0000 mg | ORAL_TABLET | Freq: Every day | ORAL | Status: DC

## 2015-10-03 MED ORDER — INSULIN DETEMIR 100 UNIT/ML ~~LOC~~ SOLN: 20.0000 [IU] | Freq: Every day | SUBCUTANEOUS | Status: DC

## 2015-10-03 NOTE — Assessment & Plan Note (Signed)
Under good control. Continue current regimen. Continue to monitor. Call with any concerns. 

## 2015-10-03 NOTE — Assessment & Plan Note (Signed)
Had been on medicine before, but had bad reaction. Does not know what the medicine was. Will start atorvastatin for elevated cholesterol with DM. Will recheck for tolerance and to check LFTs in 1 month. Call with any concerns.

## 2015-10-03 NOTE — Assessment & Plan Note (Signed)
Still not under good control. Will increase levemir to 20 units qHS. Back to 18 if FBS 100 or below. Recheck in 1 month with CMP before- A1c in 3 months.

## 2015-10-03 NOTE — Patient Instructions (Signed)
Sciatica With Rehab The sciatic nerve runs from the back down the leg and is responsible for sensation and control of the muscles in the back (posterior) side of the thigh, lower leg, and foot. Sciatica is a condition that is characterized by inflammation of this nerve.  SYMPTOMS   Signs of nerve damage, including numbness and/or weakness along the posterior side of the lower extremity.  Pain in the back of the thigh that may also travel down the leg.  Pain that worsens when sitting for long periods of time.  Occasionally, pain in the back or buttock. CAUSES  Inflammation of the sciatic nerve is the cause of sciatica. The inflammation is due to something irritating the nerve. Common sources of irritation include:  Sitting for long periods of time.  Direct trauma to the nerve.  Arthritis of the spine.  Herniated or ruptured disk.  Slipping of the vertebrae (spondylolisthesis).  Pressure from soft tissues, such as muscles or ligament-like tissue (fascia). RISK INCREASES WITH:  Sports that place pressure or stress on the spine (football or weightlifting).  Poor strength and flexibility.  Failure to warm up properly before activity.  Family history of low back pain or disk disorders.  Previous back injury or surgery.  Poor body mechanics, especially when lifting, or poor posture. PREVENTION   Warm up and stretch properly before activity.  Maintain physical fitness:  Strength, flexibility, and endurance.  Cardiovascular fitness.  Learn and use proper technique, especially with posture and lifting. When possible, have coach correct improper technique.  Avoid activities that place stress on the spine. PROGNOSIS If treated properly, then sciatica usually resolves within 6 weeks. However, occasionally surgery is necessary.  RELATED COMPLICATIONS   Permanent nerve damage, including pain, numbness, tingle, or weakness.  Chronic back pain.  Risks of surgery: infection,  bleeding, nerve damage, or damage to surrounding tissues. TREATMENT Treatment initially involves resting from any activities that aggravate your symptoms. The use of ice and medication may help reduce pain and inflammation. The use of strengthening and stretching exercises may help reduce pain with activity. These exercises may be performed at home or with referral to a therapist. A therapist may recommend further treatments, such as transcutaneous electronic nerve stimulation (TENS) or ultrasound. Your caregiver may recommend corticosteroid injections to help reduce inflammation of the sciatic nerve. If symptoms persist despite non-surgical (conservative) treatment, then surgery may be recommended. MEDICATION  If pain medication is necessary, then nonsteroidal anti-inflammatory medications, such as aspirin and ibuprofen, or other minor pain relievers, such as acetaminophen, are often recommended.  Do not take pain medication for 7 days before surgery.  Prescription pain relievers may be given if deemed necessary by your caregiver. Use only as directed and only as much as you need.  Ointments applied to the skin may be helpful.  Corticosteroid injections may be given by your caregiver. These injections should be reserved for the most serious cases, because they may only be given a certain number of times. HEAT AND COLD  Cold treatment (icing) relieves pain and reduces inflammation. Cold treatment should be applied for 10 to 15 minutes every 2 to 3 hours for inflammation and pain and immediately after any activity that aggravates your symptoms. Use ice packs or massage the area with a piece of ice (ice massage).  Heat treatment may be used prior to performing the stretching and strengthening activities prescribed by your caregiver, physical therapist, or athletic trainer. Use a heat pack or soak the injury in warm water.   SEEK MEDICAL CARE IF:  Treatment seems to offer no benefit, or the condition  worsens.  Any medications produce adverse side effects. EXERCISES  RANGE OF MOTION (ROM) AND STRETCHING EXERCISES - Sciatica Most people with sciatic will find that their symptoms worsen with either excessive bending forward (flexion) or arching at the low back (extension). The exercises which will help resolve your symptoms will focus on the opposite motion. Your physician, physical therapist or athletic trainer will help you determine which exercises will be most helpful to resolve your low back pain. Do not complete any exercises without first consulting with your clinician. Discontinue any exercises which worsen your symptoms until you speak to your clinician. If you have pain, numbness or tingling which travels down into your buttocks, leg or foot, the goal of the therapy is for these symptoms to move closer to your back and eventually resolve. Occasionally, these leg symptoms will get better, but your low back pain may worsen; this is typically an indication of progress in your rehabilitation. Be certain to be very alert to any changes in your symptoms and the activities in which you participated in the 24 hours prior to the change. Sharing this information with your clinician will allow him/her to most efficiently treat your condition. These exercises may help you when beginning to rehabilitate your injury. Your symptoms may resolve with or without further involvement from your physician, physical therapist or athletic trainer. While completing these exercises, remember:   Restoring tissue flexibility helps normal motion to return to the joints. This allows healthier, less painful movement and activity.  An effective stretch should be held for at least 30 seconds.  A stretch should never be painful. You should only feel a gentle lengthening or release in the stretched tissue. FLEXION RANGE OF MOTION AND STRETCHING EXERCISES: STRETCH - Flexion, Single Knee to Chest   Lie on a firm bed or floor  with both legs extended in front of you.  Keeping one leg in contact with the floor, bring your opposite knee to your chest. Hold your leg in place by either grabbing behind your thigh or at your knee.  Pull until you feel a gentle stretch in your low back. Hold __________ seconds.  Slowly release your grasp and repeat the exercise with the opposite side. Repeat __________ times. Complete this exercise __________ times per day.  STRETCH - Flexion, Double Knee to Chest  Lie on a firm bed or floor with both legs extended in front of you.  Keeping one leg in contact with the floor, bring your opposite knee to your chest.  Tense your stomach muscles to support your back and then lift your other knee to your chest. Hold your legs in place by either grabbing behind your thighs or at your knees.  Pull both knees toward your chest until you feel a gentle stretch in your low back. Hold __________ seconds.  Tense your stomach muscles and slowly return one leg at a time to the floor. Repeat __________ times. Complete this exercise __________ times per day.  STRETCH - Low Trunk Rotation   Lie on a firm bed or floor. Keeping your legs in front of you, bend your knees so they are both pointed toward the ceiling and your feet are flat on the floor.  Extend your arms out to the side. This will stabilize your upper body by keeping your shoulders in contact with the floor.  Gently and slowly drop both knees together to one side until   you feel a gentle stretch in your low back. Hold for __________ seconds.  Tense your stomach muscles to support your low back as you bring your knees back to the starting position. Repeat the exercise to the other side. Repeat __________ times. Complete this exercise __________ times per day  EXTENSION RANGE OF MOTION AND FLEXIBILITY EXERCISES: STRETCH - Extension, Prone on Elbows  Lie on your stomach on the floor, a bed will be too soft. Place your palms about shoulder  width apart and at the height of your head.  Place your elbows under your shoulders. If this is too painful, stack pillows under your chest.  Allow your body to relax so that your hips drop lower and make contact more completely with the floor.  Hold this position for __________ seconds.  Slowly return to lying flat on the floor. Repeat __________ times. Complete this exercise __________ times per day.  RANGE OF MOTION - Extension, Prone Press Ups  Lie on your stomach on the floor, a bed will be too soft. Place your palms about shoulder width apart and at the height of your head.  Keeping your back as relaxed as possible, slowly straighten your elbows while keeping your hips on the floor. You may adjust the placement of your hands to maximize your comfort. As you gain motion, your hands will come more underneath your shoulders.  Hold this position __________ seconds.  Slowly return to lying flat on the floor. Repeat __________ times. Complete this exercise __________ times per day.  STRENGTHENING EXERCISES - Sciatica  These exercises may help you when beginning to rehabilitate your injury. These exercises should be done near your "sweet spot." This is the neutral, low-back arch, somewhere between fully rounded and fully arched, that is your least painful position. When performed in this safe range of motion, these exercises can be used for people who have either a flexion or extension based injury. These exercises may resolve your symptoms with or without further involvement from your physician, physical therapist or athletic trainer. While completing these exercises, remember:   Muscles can gain both the endurance and the strength needed for everyday activities through controlled exercises.  Complete these exercises as instructed by your physician, physical therapist or athletic trainer. Progress with the resistance and repetition exercises only as your caregiver advises.  You may  experience muscle soreness or fatigue, but the pain or discomfort you are trying to eliminate should never worsen during these exercises. If this pain does worsen, stop and make certain you are following the directions exactly. If the pain is still present after adjustments, discontinue the exercise until you can discuss the trouble with your clinician. STRENGTHENING - Deep Abdominals, Pelvic Tilt   Lie on a firm bed or floor. Keeping your legs in front of you, bend your knees so they are both pointed toward the ceiling and your feet are flat on the floor.  Tense your lower abdominal muscles to press your low back into the floor. This motion will rotate your pelvis so that your tail bone is scooping upwards rather than pointing at your feet or into the floor.  With a gentle tension and even breathing, hold this position for __________ seconds. Repeat __________ times. Complete this exercise __________ times per day.  STRENGTHENING - Abdominals, Crunches   Lie on a firm bed or floor. Keeping your legs in front of you, bend your knees so they are both pointed toward the ceiling and your feet are flat on the   floor. Cross your arms over your chest.  Slightly tip your chin down without bending your neck.  Tense your abdominals and slowly lift your trunk high enough to just clear your shoulder blades. Lifting higher can put excessive stress on the low back and does not further strengthen your abdominal muscles.  Control your return to the starting position. Repeat __________ times. Complete this exercise __________ times per day.  STRENGTHENING - Quadruped, Opposite UE/LE Lift  Assume a hands and knees position on a firm surface. Keep your hands under your shoulders and your knees under your hips. You may place padding under your knees for comfort.  Find your neutral spine and gently tense your abdominal muscles so that you can maintain this position. Your shoulders and hips should form a rectangle  that is parallel with the floor and is not twisted.  Keeping your trunk steady, lift your right hand no higher than your shoulder and then your left leg no higher than your hip. Make sure you are not holding your breath. Hold this position __________ seconds.  Continuing to keep your abdominal muscles tense and your back steady, slowly return to your starting position. Repeat with the opposite arm and leg. Repeat __________ times. Complete this exercise __________ times per day.  STRENGTHENING - Abdominals and Quadriceps, Straight Leg Raise   Lie on a firm bed or floor with both legs extended in front of you.  Keeping one leg in contact with the floor, bend the other knee so that your foot can rest flat on the floor.  Find your neutral spine, and tense your abdominal muscles to maintain your spinal position throughout the exercise.  Slowly lift your straight leg off the floor about 6 inches for a count of 15, making sure to not hold your breath.  Still keeping your neutral spine, slowly lower your leg all the way to the floor. Repeat this exercise with each leg __________ times. Complete this exercise __________ times per day. POSTURE AND BODY MECHANICS CONSIDERATIONS - Sciatica Keeping correct posture when sitting, standing or completing your activities will reduce the stress put on different body tissues, allowing injured tissues a chance to heal and limiting painful experiences. The following are general guidelines for improved posture. Your physician or physical therapist will provide you with any instructions specific to your needs. While reading these guidelines, remember:  The exercises prescribed by your provider will help you have the flexibility and strength to maintain correct postures.  The correct posture provides the optimal environment for your joints to work. All of your joints have less wear and tear when properly supported by a spine with good posture. This means you will  experience a healthier, less painful body.  Correct posture must be practiced with all of your activities, especially prolonged sitting and standing. Correct posture is as important when doing repetitive low-stress activities (typing) as it is when doing a single heavy-load activity (lifting). RESTING POSITIONS Consider which positions are most painful for you when choosing a resting position. If you have pain with flexion-based activities (sitting, bending, stooping, squatting), choose a position that allows you to rest in a less flexed posture. You would want to avoid curling into a fetal position on your side. If your pain worsens with extension-based activities (prolonged standing, working overhead), avoid resting in an extended position such as sleeping on your stomach. Most people will find more comfort when they rest with their spine in a more neutral position, neither too rounded nor too   arched. Lying on a non-sagging bed on your side with a pillow between your knees, or on your back with a pillow under your knees will often provide some relief. Keep in mind, being in any one position for a prolonged period of time, no matter how correct your posture, can still lead to stiffness. PROPER SITTING POSTURE In order to minimize stress and discomfort on your spine, you must sit with correct posture Sitting with good posture should be effortless for a healthy body. Returning to good posture is a gradual process. Many people can work toward this most comfortably by using various supports until they have the flexibility and strength to maintain this posture on their own. When sitting with proper posture, your ears will fall over your shoulders and your shoulders will fall over your hips. You should use the back of the chair to support your upper back. Your low back will be in a neutral position, just slightly arched. You may place a small pillow or folded towel at the base of your low back for support.  When  working at a desk, create an environment that supports good, upright posture. Without extra support, muscles fatigue and lead to excessive strain on joints and other tissues. Keep these recommendations in mind: CHAIR:   A chair should be able to slide under your desk when your back makes contact with the back of the chair. This allows you to work closely.  The chair's height should allow your eyes to be level with the upper part of your monitor and your hands to be slightly lower than your elbows. BODY POSITION  Your feet should make contact with the floor. If this is not possible, use a foot rest.  Keep your ears over your shoulders. This will reduce stress on your neck and low back. INCORRECT SITTING POSTURES   If you are feeling tired and unable to assume a healthy sitting posture, do not slouch or slump. This puts excessive strain on your back tissues, causing more damage and pain. Healthier options include:  Using more support, like a lumbar pillow.  Switching tasks to something that requires you to be upright or walking.  Talking a brief walk.  Lying down to rest in a neutral-spine position. PROLONGED STANDING WHILE SLIGHTLY LEANING FORWARD  When completing a task that requires you to lean forward while standing in one place for a long time, place either foot up on a stationary 2-4 inch high object to help maintain the best posture. When both feet are on the ground, the low back tends to lose its slight inward curve. If this curve flattens (or becomes too large), then the back and your other joints will experience too much stress, fatigue more quickly and can cause pain.  CORRECT STANDING POSTURES Proper standing posture should be assumed with all daily activities, even if they only take a few moments, like when brushing your teeth. As in sitting, your ears should fall over your shoulders and your shoulders should fall over your hips. You should keep a slight tension in your abdominal  muscles to brace your spine. Your tailbone should point down to the ground, not behind your body, resulting in an over-extended swayback posture.  INCORRECT STANDING POSTURES  Common incorrect standing postures include a forward head, locked knees and/or an excessive swayback. WALKING Walk with an upright posture. Your ears, shoulders and hips should all line-up. PROLONGED ACTIVITY IN A FLEXED POSITION When completing a task that requires you to bend forward   at your waist or lean over a low surface, try to find a way to stabilize 3 of 4 of your limbs. You can place a hand or elbow on your thigh or rest a knee on the surface you are reaching across. This will provide you more stability so that your muscles do not fatigue as quickly. By keeping your knees relaxed, or slightly bent, you will also reduce stress across your low back. CORRECT LIFTING TECHNIQUES DO :   Assume a wide stance. This will provide you more stability and the opportunity to get as close as possible to the object which you are lifting.  Tense your abdominals to brace your spine; then bend at the knees and hips. Keeping your back locked in a neutral-spine position, lift using your leg muscles. Lift with your legs, keeping your back straight.  Test the weight of unknown objects before attempting to lift them.  Try to keep your elbows locked down at your sides in order get the best strength from your shoulders when carrying an object.  Always ask for help when lifting heavy or awkward objects. INCORRECT LIFTING TECHNIQUES DO NOT:   Lock your knees when lifting, even if it is a small object.  Bend and twist. Pivot at your feet or move your feet when needing to change directions.  Assume that you cannot safely pick up a paperclip without proper posture.   This information is not intended to replace advice given to you by your health care provider. Make sure you discuss any questions you have with your health care provider.     Document Released: 04/06/2005 Document Revised: 08/21/2014 Document Reviewed: 07/19/2008 [employer] Interactive Patient Education 2016 [place] The sciatic nerve runs from the back down the leg and is responsible for sensation and control of the muscles in the back (posterior) side of the thigh, lower leg, and foot. Sciatica is a condition that is characterized by inflammation of this nerve.  SYMPTOMS   Signs of nerve damage, including numbness and/or weakness along the posterior side of the lower extremity.  Pain in the back of the thigh that may also travel down the leg.  Pain that worsens when sitting for long periods of time.  Occasionally, pain in the back or buttock. CAUSES  Inflammation of the sciatic nerve is the cause of sciatica. The inflammation is due to something irritating the nerve. Common sources of irritation include:  Sitting for long periods of time.  Direct trauma to the nerve.  Arthritis of the spine.  Herniated or ruptured disk.  Slipping of the vertebrae (spondylolisthesis).  Pressure from soft tissues, such as muscles or ligament-like tissue (fascia). RISK INCREASES WITH:  Sports that place pressure or stress on the spine (football or weightlifting).  Poor strength and flexibility.  Failure to warm up properly before activity.  Family history of low back pain or disk disorders.  Previous back injury or surgery.  Poor body mechanics, especially when lifting, or poor posture. PREVENTION   Warm up and stretch properly before activity.  Maintain physical fitness:  Strength, flexibility, and endurance.  Cardiovascular fitness.  Learn and use proper technique, especially with posture and lifting. When possible, have coach correct improper technique.  Avoid activities that place stress on the spine. PROGNOSIS If treated properly, then sciatica usually resolves within 6 weeks. However, occasionally surgery is necessary.   RELATED COMPLICATIONS   Permanent nerve damage, including pain, numbness, tingle, or weakness.  Chronic back pain.  Risks  of surgery: infection, bleeding, nerve damage, or damage to surrounding tissues. TREATMENT Treatment initially involves resting from any activities that aggravate your symptoms. The use of ice and medication may help reduce pain and inflammation. The use of strengthening and stretching exercises may help reduce pain with activity. These exercises may be performed at home or with referral to a therapist. A therapist may recommend further treatments, such as transcutaneous electronic nerve stimulation (TENS) or ultrasound. Your caregiver may recommend corticosteroid injections to help reduce inflammation of the sciatic nerve. If symptoms persist despite non-surgical (conservative) treatment, then surgery may be recommended. MEDICATION  If pain medication is necessary, then nonsteroidal anti-inflammatory medications, such as aspirin and ibuprofen, or other minor pain relievers, such as acetaminophen, are often recommended.  Do not take pain medication for 7 days before surgery.  Prescription pain relievers may be given if deemed necessary by your caregiver. Use only as directed and only as much as you need.  Ointments applied to the skin may be helpful.  Corticosteroid injections may be given by your caregiver. These injections should be reserved for the most serious cases, because they may only be given a certain number of times. HEAT AND COLD  Cold treatment (icing) relieves pain and reduces inflammation. Cold treatment should be applied for 10 to 15 minutes every 2 to 3 hours for inflammation and pain and immediately after any activity that aggravates your symptoms. Use ice packs or massage the area with a piece of ice (ice massage).  Heat treatment may be used prior to performing the stretching and strengthening activities prescribed by your caregiver, physical  therapist, or athletic trainer. Use a heat pack or soak the injury in warm water. SEEK MEDICAL CARE IF:  Treatment seems to offer no benefit, or the condition worsens.  Any medications produce adverse side effects. EXERCISES  RANGE OF MOTION (ROM) AND STRETCHING EXERCISES - Sciatica Most people with sciatic will find that their symptoms worsen with either excessive bending forward (flexion) or arching at the low back (extension). The exercises which will help resolve your symptoms will focus on the opposite motion. Your physician, physical therapist or athletic trainer will help you determine which exercises will be most helpful to resolve your low back pain. Do not complete any exercises without first consulting with your clinician. Discontinue any exercises which worsen your symptoms until you speak to your clinician. If you have pain, numbness or tingling which travels down into your buttocks, leg or foot, the goal of the therapy is for these symptoms to move closer to your back and eventually resolve. Occasionally, these leg symptoms will get better, but your low back pain may worsen; this is typically an indication of progress in your rehabilitation. Be certain to be very alert to any changes in your symptoms and the activities in which you participated in the 24 hours prior to the change. Sharing this information with your clinician will allow him/her to most efficiently treat your condition. These exercises may help you when beginning to rehabilitate your injury. Your symptoms may resolve with or without further involvement from your physician, physical therapist or athletic trainer. While completing these exercises, remember:   Restoring tissue flexibility helps normal motion to return to the joints. This allows healthier, less painful movement and activity.  An effective stretch should be held for at least 30 seconds.  A stretch should never be painful. You should only feel a gentle  lengthening or release in the stretched tissue. FLEXION RANGE OF MOTION  AND STRETCHING EXERCISES: STRETCH - Flexion, Single Knee to Chest   Lie on a firm bed or floor with both legs extended in front of you.  Keeping one leg in contact with the floor, bring your opposite knee to your chest. Hold your leg in place by either grabbing behind your thigh or at your knee.  Pull until you feel a gentle stretch in your low back. Hold __________ seconds.  Slowly release your grasp and repeat the exercise with the opposite side. Repeat __________ times. Complete this exercise __________ times per day.  STRETCH - Flexion, Double Knee to Chest  Lie on a firm bed or floor with both legs extended in front of you.  Keeping one leg in contact with the floor, bring your opposite knee to your chest.  Tense your stomach muscles to support your back and then lift your other knee to your chest. Hold your legs in place by either grabbing behind your thighs or at your knees.  Pull both knees toward your chest until you feel a gentle stretch in your low back. Hold __________ seconds.  Tense your stomach muscles and slowly return one leg at a time to the floor. Repeat __________ times. Complete this exercise __________ times per day.  STRETCH - Low Trunk Rotation   Lie on a firm bed or floor. Keeping your legs in front of you, bend your knees so they are both pointed toward the ceiling and your feet are flat on the floor.  Extend your arms out to the side. This will stabilize your upper body by keeping your shoulders in contact with the floor.  Gently and slowly drop both knees together to one side until you feel a gentle stretch in your low back. Hold for __________ seconds.  Tense your stomach muscles to support your low back as you bring your knees back to the starting position. Repeat the exercise to the other side. Repeat __________ times. Complete this exercise __________ times per day  EXTENSION  RANGE OF MOTION AND FLEXIBILITY EXERCISES: STRETCH - Extension, Prone on Elbows  Lie on your stomach on the floor, a bed will be too soft. Place your palms about shoulder width apart and at the height of your head.  Place your elbows under your shoulders. If this is too painful, stack pillows under your chest.  Allow your body to relax so that your hips drop lower and make contact more completely with the floor.  Hold this position for __________ seconds.  Slowly return to lying flat on the floor. Repeat __________ times. Complete this exercise __________ times per day.  RANGE OF MOTION - Extension, Prone Press Ups  Lie on your stomach on the floor, a bed will be too soft. Place your palms about shoulder width apart and at the height of your head.  Keeping your back as relaxed as possible, slowly straighten your elbows while keeping your hips on the floor. You may adjust the placement of your hands to maximize your comfort. As you gain motion, your hands will come more underneath your shoulders.  Hold this position __________ seconds.  Slowly return to lying flat on the floor. Repeat __________ times. Complete this exercise __________ times per day.  STRENGTHENING EXERCISES - Sciatica  These exercises may help you when beginning to rehabilitate your injury. These exercises should be done near your "sweet spot." This is the neutral, low-back arch, somewhere between fully rounded and fully arched, that is your least painful position. When performed in  this safe range of motion, these exercises can be used for people who have either a flexion or extension based injury. These exercises may resolve your symptoms with or without further involvement from your physician, physical therapist or athletic trainer. While completing these exercises, remember:   Muscles can gain both the endurance and the strength needed for everyday activities through controlled exercises.  Complete these exercises as  instructed by your physician, physical therapist or athletic trainer. Progress with the resistance and repetition exercises only as your caregiver advises.  You may experience muscle soreness or fatigue, but the pain or discomfort you are trying to eliminate should never worsen during these exercises. If this pain does worsen, stop and make certain you are following the directions exactly. If the pain is still present after adjustments, discontinue the exercise until you can discuss the trouble with your clinician. STRENGTHENING - Deep Abdominals, Pelvic Tilt   Lie on a firm bed or floor. Keeping your legs in front of you, bend your knees so they are both pointed toward the ceiling and your feet are flat on the floor.  Tense your lower abdominal muscles to press your low back into the floor. This motion will rotate your pelvis so that your tail bone is scooping upwards rather than pointing at your feet or into the floor.  With a gentle tension and even breathing, hold this position for __________ seconds. Repeat __________ times. Complete this exercise __________ times per day.  STRENGTHENING - Abdominals, Crunches   Lie on a firm bed or floor. Keeping your legs in front of you, bend your knees so they are both pointed toward the ceiling and your feet are flat on the floor. Cross your arms over your chest.  Slightly tip your chin down without bending your neck.  Tense your abdominals and slowly lift your trunk high enough to just clear your shoulder blades. Lifting higher can put excessive stress on the low back and does not further strengthen your abdominal muscles.  Control your return to the starting position. Repeat __________ times. Complete this exercise __________ times per day.  STRENGTHENING - Quadruped, Opposite UE/LE Lift  Assume a hands and knees position on a firm surface. Keep your hands under your shoulders and your knees under your hips. You may place padding under your knees  for comfort.  Find your neutral spine and gently tense your abdominal muscles so that you can maintain this position. Your shoulders and hips should form a rectangle that is parallel with the floor and is not twisted.  Keeping your trunk steady, lift your right hand no higher than your shoulder and then your left leg no higher than your hip. Make sure you are not holding your breath. Hold this position __________ seconds.  Continuing to keep your abdominal muscles tense and your back steady, slowly return to your starting position. Repeat with the opposite arm and leg. Repeat __________ times. Complete this exercise __________ times per day.  STRENGTHENING - Abdominals and Quadriceps, Straight Leg Raise   Lie on a firm bed or floor with both legs extended in front of you.  Keeping one leg in contact with the floor, bend the other knee so that your foot can rest flat on the floor.  Find your neutral spine, and tense your abdominal muscles to maintain your spinal position throughout the exercise.  Slowly lift your straight leg off the floor about 6 inches for a count of 15, making sure to not hold your breath.  Still keeping your neutral spine, slowly lower your leg all the way to the floor. Repeat this exercise with each leg __________ times. Complete this exercise __________ times per day. POSTURE AND BODY MECHANICS CONSIDERATIONS - Sciatica Keeping correct posture when sitting, standing or completing your activities will reduce the stress put on different body tissues, allowing injured tissues a chance to heal and limiting painful experiences. The following are general guidelines for improved posture. Your physician or physical therapist will provide you with any instructions specific to your needs. While reading these guidelines, remember:  The exercises prescribed by your provider will help you have the flexibility and strength to maintain correct postures.  The correct posture provides  the optimal environment for your joints to work. All of your joints have less wear and tear when properly supported by a spine with good posture. This means you will experience a healthier, less painful body.  Correct posture must be practiced with all of your activities, especially prolonged sitting and standing. Correct posture is as important when doing repetitive low-stress activities (typing) as it is when doing a single heavy-load activity (lifting). RESTING POSITIONS Consider which positions are most painful for you when choosing a resting position. If you have pain with flexion-based activities (sitting, bending, stooping, squatting), choose a position that allows you to rest in a less flexed posture. You would want to avoid curling into a fetal position on your side. If your pain worsens with extension-based activities (prolonged standing, working overhead), avoid resting in an extended position such as sleeping on your stomach. Most people will find more comfort when they rest with their spine in a more neutral position, neither too rounded nor too arched. Lying on a non-sagging bed on your side with a pillow between your knees, or on your back with a pillow under your knees will often provide some relief. Keep in mind, being in any one position for a prolonged period of time, no matter how correct your posture, can still lead to stiffness. PROPER SITTING POSTURE In order to minimize stress and discomfort on your spine, you must sit with correct posture Sitting with good posture should be effortless for a healthy body. Returning to good posture is a gradual process. Many people can work toward this most comfortably by using various supports until they have the flexibility and strength to maintain this posture on their own. When sitting with proper posture, your ears will fall over your shoulders and your shoulders will fall over your hips. You should use the back of the chair to support your upper  back. Your low back will be in a neutral position, just slightly arched. You may place a small pillow or folded towel at the base of your low back for support.  When working at a desk, create an environment that supports good, upright posture. Without extra support, muscles fatigue and lead to excessive strain on joints and other tissues. Keep these recommendations in mind: CHAIR:   A chair should be able to slide under your desk when your back makes contact with the back of the chair. This allows you to work closely.  The chair's height should allow your eyes to be level with the upper part of your monitor and your hands to be slightly lower than your elbows. BODY POSITION  Your feet should make contact with the floor. If this is not possible, use a foot rest.  Keep your ears over your shoulders. This will reduce stress on your neck and  low back. INCORRECT SITTING POSTURES   If you are feeling tired and unable to assume a healthy sitting posture, do not slouch or slump. This puts excessive strain on your back tissues, causing more damage and pain. Healthier options include:  Using more support, like a lumbar pillow.  Switching tasks to something that requires you to be upright or walking.  Talking a brief walk.  Lying down to rest in a neutral-spine position. PROLONGED STANDING WHILE SLIGHTLY LEANING FORWARD  When completing a task that requires you to lean forward while standing in one place for a long time, place either foot up on a stationary 2-4 inch high object to help maintain the best posture. When both feet are on the ground, the low back tends to lose its slight inward curve. If this curve flattens (or becomes too large), then the back and your other joints will experience too much stress, fatigue more quickly and can cause pain.  CORRECT STANDING POSTURES Proper standing posture should be assumed with all daily activities, even if they only take a few moments, like when brushing  your teeth. As in sitting, your ears should fall over your shoulders and your shoulders should fall over your hips. You should keep a slight tension in your abdominal muscles to brace your spine. Your tailbone should point down to the ground, not behind your body, resulting in an over-extended swayback posture.  INCORRECT STANDING POSTURES  Common incorrect standing postures include a forward head, locked knees and/or an excessive swayback. WALKING Walk with an upright posture. Your ears, shoulders and hips should all line-up. PROLONGED ACTIVITY IN A FLEXED POSITION When completing a task that requires you to bend forward at your waist or lean over a low surface, try to find a way to stabilize 3 of 4 of your limbs. You can place a hand or elbow on your thigh or rest a knee on the surface you are reaching across. This will provide you more stability so that your muscles do not fatigue as quickly. By keeping your knees relaxed, or slightly bent, you will also reduce stress across your low back. CORRECT LIFTING TECHNIQUES DO :   Assume a wide stance. This will provide you more stability and the opportunity to get as close as possible to the object which you are lifting.  Tense your abdominals to brace your spine; then bend at the knees and hips. Keeping your back locked in a neutral-spine position, lift using your leg muscles. Lift with your legs, keeping your back straight.  Test the weight of unknown objects before attempting to lift them.  Try to keep your elbows locked down at your sides in order get the best strength from your shoulders when carrying an object.  Always ask for help when lifting heavy or awkward objects. INCORRECT LIFTING TECHNIQUES DO NOT:   Lock your knees when lifting, even if it is a small object.  Bend and twist. Pivot at your feet or move your feet when needing to change directions.  Assume that you cannot safely pick up a paperclip without proper posture.   This  information is not intended to replace advice given to you by your health care provider. Make sure you discuss any questions you have with your health care provider.   Document Released: 04/06/2005 Document Revised: 08/21/2014 Document Reviewed: 07/19/2008 Elsevier Interactive Patient Education [employer].

## 2015-10-03 NOTE — Progress Notes (Signed)
BP 120/74 mmHg  Pulse 93  Temp(Src) 98.2 F (36.8 C)  Resp 16  Wt 165 lb (74.844 kg)   Subjective:    Patient ID: Kelly Doyle, female    DOB: 01/03/1956, 60 y.o.   MRN: [id number]  HPI: Kelly Doyle is a 60 y.o. female  Chief Complaint  Patient presents with  . Results    lab results   DIABETES- A1c down to 8.6 from 9.7 Hypoglycemic episodes:no Polydipsia/polyuria: no Visual disturbance: no Chest pain: no Paresthesias: no Glucose Monitoring: yes  Accucheck frequency: Daily  Fasting glucose: 150 Taking Insulin?: yes  Long acting insulin: levemir Blood Pressure Monitoring: not checking  ANEMIA- hgb increased up to 11.6 Anemia status: better Etiology of anemia: Iron deficiency Compliance with treatment: good compliance Iron supplementation side effects: no Severity of anemia: mild Fatigue: yes Decreased exercise tolerance: no  Dyspnea on exertion: no Palpitations: no Bleeding: no Pica: no  HYPERTENSION / HYPERLIPIDEMIA Satisfied with current treatment? yes Duration of hypertension: chronic BP monitoring frequency: not checking BP medication side effects: no Past BP meds: lisinopril- hctz and amolidpine Duration of hyperlipidemia: unknown- newly diagnosed Cholesterol medication side effects: yes- made her have an accident in bed so stopped it Cholesterol supplements: none Past cholesterol medications: unknown what it was Medication compliance: good compliance Aspirin: no Recent stressors: no Recurrent headaches: no Visual changes: no Palpitations: no Dyspnea: no Chest pain: no Lower extremity edema: no Dizzy/lightheaded: no  Relevant past medical, surgical, family and social history reviewed and updated as indicated. Interim medical history since our last visit reviewed. Allergies and medications reviewed and updated.  Review of Systems  Constitutional: Negative.   Respiratory: Negative.   Cardiovascular: Negative.   Musculoskeletal: Positive  for myalgias.  Neurological: Positive for numbness (occasional shooting pains from her bottom down the side of her L leg with some buttock pain).  Psychiatric/Behavioral: Negative.     Per HPI unless specifically indicated above     Objective:    BP 120/74 mmHg  Pulse 93  Temp(Src) 98.2 F (36.8 C)  Resp 16  Wt 165 lb (74.844 kg)  Wt Readings from Last 3 Encounters:  10/03/15 165 lb (74.844 kg)  09/19/15 167 lb (75.751 kg)  07/09/15 163 lb (73.936 kg)    Physical Exam  Constitutional: She is oriented to person, place, and time. She appears well-developed and well-nourished. No distress.  HENT:  Head: Normocephalic and atraumatic.  Right Ear: Hearing normal.  Left Ear: Hearing normal.  Nose: Nose normal.  Eyes: Conjunctivae and lids are normal. Right eye exhibits no discharge. Left eye exhibits no discharge. No scleral icterus.  Cardiovascular: Normal rate, regular rhythm, normal heart sounds and intact distal pulses.  Exam reveals no gallop and no friction rub.   No murmur heard. Pulmonary/Chest: Effort normal and breath sounds normal. No respiratory distress. She has no wheezes. She has no rales. She exhibits no tenderness.  Musculoskeletal: Normal range of motion.  Neurological: She is alert and oriented to person, place, and time.  Skin: Skin is warm, dry and intact. No rash noted. No erythema.  Psychiatric: She has a normal mood and affect. Her speech is normal and behavior is normal. Judgment and thought content normal. Cognition and memory are normal.  Nursing note and vitals reviewed.   Results for orders placed or performed in visit on 09/19/15  CBC with Differential  Result Value Ref Range   WBC 8.9 3.4 - 10.8 x10E3/uL   RBC 3.93 3.77 - 5.28  x10E6/uL   Hemoglobin 11.6 11.1 - 15.9 g/dL   Hematocrit 35.8 34.0 - 46.6 %   MCV 91 79 - 97 fL   MCH 29.5 26.6 - 33.0 pg   MCHC 32.4 31.5 - 35.7 g/dL   RDW 12.9 12.3 - 15.4 %   Platelets 333 150 - 379 x10E3/uL    Neutrophils 47 %   Lymphs 43 %   Monocytes 7 %   Eos 3 %   Basos 0 %   Neutrophils Absolute 4.2 1.4 - 7.0 x10E3/uL   Lymphocytes Absolute 3.8 (H) 0.7 - 3.1 x10E3/uL   Monocytes Absolute 0.6 0.1 - 0.9 x10E3/uL   EOS (ABSOLUTE) 0.2 0.0 - 0.4 x10E3/uL   Basophils Absolute 0.0 0.0 - 0.2 x10E3/uL   Immature Granulocytes 0 %   Immature Grans (Abs) 0.0 0.0 - 0.1 x10E3/uL  Comp Met (CMET)  Result Value Ref Range   Glucose 149 (H) 65 - 99 mg/dL   BUN 32 (H) 8 - 27 mg/dL   Creatinine, Ser 1.29 (H) 0.57 - 1.00 mg/dL   GFR calc non Af Amer 45 (L) >59 mL/min/1.73   GFR calc Af Amer 52 (L) >59 mL/min/1.73   BUN/Creatinine Ratio 25 12 - 28   Sodium 145 (H) 134 - 144 mmol/L   Potassium 4.7 3.5 - 5.2 mmol/L   Chloride 101 96 - 106 mmol/L   CO2 25 18 - 29 mmol/L   Calcium 10.4 (H) 8.7 - 10.3 mg/dL   Total Protein 7.4 6.0 - 8.5 g/dL   Albumin 4.4 3.6 - 4.8 g/dL   Globulin, Total 3.0 1.5 - 4.5 g/dL   Albumin/Globulin Ratio 1.5 1.2 - 2.2   Bilirubin Total <0.2 0.0 - 1.2 mg/dL   Alkaline Phosphatase 70 39 - 117 IU/L   AST 13 0 - 40 IU/L   ALT 14 0 - 32 IU/L  HgB A1c  Result Value Ref Range   Hgb A1c MFr Bld 8.6 (H) 4.8 - 5.6 %   Est. average glucose Bld gHb Est-mCnc 200 mg/dL  Lipid Profile  Result Value Ref Range   Cholesterol, Total 224 (H) 100 - 199 mg/dL   Triglycerides 370 (H) 0 - 149 mg/dL   HDL 55 >39 mg/dL   VLDL Cholesterol Cal 74 (H) 5 - 40 mg/dL   LDL Calculated 95 0 - 99 mg/dL   Chol/HDL Ratio 4.1 0.0 - 4.4 ratio units  TSH  Result Value Ref Range   TSH 1.420 0.450 - 4.500 uIU/mL  Ferritin  Result Value Ref Range   Ferritin 239 (H) 15 - 150 ng/mL  POCT Glucose (CBG)  Result Value Ref Range   POC Glucose 141 (A) 70 - 99 mg/dl      Assessment & Plan:   Problem List Items Addressed This Visit      Cardiovascular and Mediastinum   Hypertension    Under good control. Continue current regimen. Continue to monitor. Call with any concerns.       Relevant Medications    atorvastatin (LIPITOR) 40 MG tablet   Other Relevant Orders   Comprehensive metabolic panel     Endocrine   Diabetes ([place]) - Primary    Still not under good control. Will increase levemir to 20 units qHS. Back to 18 if FBS 100 or below. Recheck in 1 month with CMP before- A1c in 3 months.       Relevant Medications   insulin detemir (LEVEMIR) 100 UNIT/ML injection   atorvastatin (LIPITOR) 40 MG  tablet   Other Relevant Orders   Comprehensive metabolic panel     Other   Hyperlipidemia    Had been on medicine before, but had bad reaction. Does not know what the medicine was. Will start atorvastatin for elevated cholesterol with DM. Will recheck for tolerance and to check LFTs in 1 month. Call with any concerns.       Relevant Medications   atorvastatin (LIPITOR) 40 MG tablet   Other Relevant Orders   Lipid Panel w/o Chol/HDL Ratio   Comprehensive metabolic panel   Iron deficiency anemia    Other Visit Diagnoses    Sciatica of left side        Will start stretches. Exercises given to patient. Call with any concerns. Continue to monitor.         Follow up plan: Return in about 4 weeks (around 10/31/2015) for follow up Cholesterol and DM with labs before.

## 2015-10-18 ENCOUNTER — Emergency Department
Admission: EM | Admit: 2015-10-18 | Discharge: 2015-10-18 | Disposition: A | Discharge status: Home / Self Care | Payer: Medicaid Other | Attending: Emergency Medicine | Admitting: Emergency Medicine

## 2015-10-18 ENCOUNTER — Encounter: Payer: Self-pay | Admitting: Urgent Care

## 2015-10-18 DIAGNOSIS — E119 Type 2 diabetes mellitus without complications: Secondary | ICD-10-CM | POA: Diagnosis not present

## 2015-10-18 DIAGNOSIS — F600 Paranoid personality disorder: Secondary | ICD-10-CM | POA: Insufficient documentation

## 2015-10-18 DIAGNOSIS — E785 Hyperlipidemia, unspecified: Secondary | ICD-10-CM | POA: Diagnosis not present

## 2015-10-18 DIAGNOSIS — W268XXA Contact with other sharp object(s), not elsewhere classified, initial encounter: Secondary | ICD-10-CM | POA: Diagnosis not present

## 2015-10-18 DIAGNOSIS — S51811A Laceration without foreign body of right forearm, initial encounter: Secondary | ICD-10-CM

## 2015-10-18 DIAGNOSIS — I10 Essential (primary) hypertension: Secondary | ICD-10-CM | POA: Insufficient documentation

## 2015-10-18 DIAGNOSIS — Y9389 Activity, other specified: Secondary | ICD-10-CM | POA: Insufficient documentation

## 2015-10-18 DIAGNOSIS — Y999 Unspecified external cause status: Secondary | ICD-10-CM | POA: Diagnosis not present

## 2015-10-18 DIAGNOSIS — Z794 Long term (current) use of insulin: Secondary | ICD-10-CM | POA: Insufficient documentation

## 2015-10-18 DIAGNOSIS — Z7984 Long term (current) use of oral hypoglycemic drugs: Secondary | ICD-10-CM | POA: Insufficient documentation

## 2015-10-18 DIAGNOSIS — Z79899 Other long term (current) drug therapy: Secondary | ICD-10-CM | POA: Insufficient documentation

## 2015-10-18 DIAGNOSIS — F322 Major depressive disorder, single episode, severe without psychotic features: Secondary | ICD-10-CM | POA: Diagnosis not present

## 2015-10-18 DIAGNOSIS — Y92002 Bathroom of unspecified non-institutional (private) residence single-family (private) house as the place of occurrence of the external cause: Secondary | ICD-10-CM | POA: Diagnosis not present

## 2015-10-18 MED ORDER — LIDOCAINE HCL (PF) 1 % IJ SOLN
INTRAMUSCULAR | Status: AC
  Filled 2015-10-18: qty 5

## 2015-10-18 MED ORDER — LIDOCAINE HCL (PF) 1 % IJ SOLN: 5.0000 mL | Freq: Once | INTRAMUSCULAR | Status: DC

## 2015-10-18 MED ORDER — TETANUS-DIPHTH-ACELL PERTUSSIS 5-2.5-18.5 LF-MCG/0.5 IM SUSP
0.5000 mL | Freq: Once | INTRAMUSCULAR | Status: AC
  Administered 2015-10-18: 0.5 mL via INTRAMUSCULAR
  Filled 2015-10-18: qty 0.5

## 2015-10-18 MED ORDER — BACITRACIN ZINC 500 UNIT/GM EX OINT
TOPICAL_OINTMENT | CUTANEOUS | Status: AC
  Filled 2015-10-18: qty 0.9

## 2015-10-18 MED ORDER — BACITRACIN ZINC 500 UNIT/GM EX OINT
TOPICAL_OINTMENT | Freq: Once | CUTANEOUS | Status: AC
  Administered 2015-10-18: via TOPICAL
  Filled 2015-10-18: qty 0.9

## 2015-10-18 NOTE — Discharge Instructions (Signed)
Keep the wound clean, dry, and covered. Wash only with soap & water during dressing changes. Follow-up with [facility] in 10-12 days for suture removal.   Laceration Care, Adult A laceration is a cut that goes through all layers of the skin. The cut also goes into the tissue that is right under the skin. Some cuts heal on their own. Others need to be closed with stitches (sutures), staples, skin adhesive strips, or wound glue. Taking care of your cut lowers your risk of infection and helps your cut to heal better. HOW TO TAKE CARE OF YOUR CUT For stitches or staples:  Keep the wound clean and dry.  If you were given a bandage (dressing), you should change it at least one time per day or as told by your doctor. You should also change it if it gets wet or dirty.  Keep the wound completely dry for the first 24 hours or as told by your doctor. After that time, you may take a shower or a bath. However, make sure that the wound is not soaked in water until after the stitches or staples have been removed.  Clean the wound one time each day or as told by your doctor:  Wash the wound with soap and water.  Rinse the wound with water until all of the soap comes off.  Pat the wound dry with a clean towel. Do not rub the wound.  After you clean the wound, put a thin layer of antibiotic ointment on it as told by your doctor. This ointment:  Helps to prevent infection.  Keeps the bandage from sticking to the wound.  Have your stitches or staples removed as told by your doctor. If your doctor used skin adhesive strips:   Keep the wound clean and dry.  If you were given a bandage, you should change it at least one time per day or as told by your doctor. You should also change it if it gets dirty or wet.  Do not get the skin adhesive strips wet. You can take a shower or a bath, but be careful to keep the wound dry.  If the wound gets wet, pat it dry with a clean towel. Do not rub the  wound.  Skin adhesive strips fall off on their own. You can trim the strips as the wound heals. Do not remove any strips that are still stuck to the wound. They will fall off after a while. If your doctor used wound glue:  Try to keep your wound dry, but you may briefly wet it in the shower or bath. Do not soak the wound in water, such as by swimming.  After you take a shower or a bath, gently pat the wound dry with a clean towel. Do not rub the wound.  Do not do any activities that will make you really sweaty until the skin glue has fallen off on its own.  Do not apply liquid, cream, or ointment medicine to your wound while the skin glue is still on.  If you were given a bandage, you should change it at least one time per day or as told by your doctor. You should also change it if it gets dirty or wet.  If a bandage is placed over the wound, do not let the tape for the bandage touch the skin glue.  Do not pick at the glue. The skin glue usually stays on for 5-10 days. Then, it falls off of the  skin. General Instructions  To help prevent scarring, make sure to cover your wound with sunscreen whenever you are outside after stitches are removed, after adhesive strips are removed, or when wound glue stays in place and the wound is healed. Make sure to wear a sunscreen of at least 30 SPF.  Take over-the-counter and prescription medicines only as told by your doctor.  If you were given antibiotic medicine or ointment, take or apply it as told by your doctor. Do not stop using the antibiotic even if your wound is getting better.  Do not scratch or pick at the wound.  Keep all follow-up visits as told by your doctor. This is important.  Check your wound every day for signs of infection. Watch for:  Redness, swelling, or pain.  Fluid, blood, or pus.  Raise (elevate) the injured area above the level of your heart while you are sitting or lying down, if possible. GET HELP IF:  You got a  tetanus shot and you have any of these problems at the injection site:  Swelling.  Very bad pain.  Redness.  Bleeding.  You have a fever.  A wound that was closed breaks open.  You notice a bad smell coming from your wound or your bandage.  You notice something coming out of the wound, such as wood or glass.  Medicine does not help your pain.  You have more redness, swelling, or pain at the site of your wound.  You have fluid, blood, or pus coming from your wound.  You notice a change in the color of your skin near your wound.  You need to change the bandage often because fluid, blood, or pus is coming from the wound.  You start to have a new rash.  You start to have numbness around the wound. GET HELP RIGHT AWAY IF:  You have very bad swelling around the wound.  Your pain suddenly gets worse and is very bad.  You notice painful lumps near the wound or on skin that is anywhere on your body.  You have a red streak going away from your wound.  The wound is on your hand or foot and you cannot move a finger or toe like you usually can.  The wound is on your hand or foot and you notice that your fingers or toes look pale or bluish.   This information is not intended to replace advice given to you by your health care provider. Make sure you discuss any questions you have with your health care provider.   Document Released: 09/23/2007 Document Revised: 08/21/2014 Document Reviewed: 04/02/2014 Elsevier Interactive Patient Education [employer].

## 2015-10-18 NOTE — ED Provider Notes (Signed)
[facility] Emergency Department Provider Note ____________________________________________  Time seen: 2301  I have reviewed the triage vital signs and the nursing notes.  HISTORY  Chief Complaint  Laceration  HPI Kelly Doyle is a 60 y.o. female presents to the ED for evaluation laceration to the right forearm. She describes actually cutting her forearm on a piece of broken porcelain bathroom sig that she was attempting to fix. She apparently lost balance and fell, causing a laceration to her forearm. She denies any distal paresthesias, weakness, or disability. She is unclear for current tetanus status. She denies any other injury at this time.Rates her pain at a 5/10 in triage.  Past Medical History  Diagnosis Date  . Diabetes mellitus without complication ([place])   . Hypertension   . Anxiety     Patient Active Problem List   Diagnosis Date Noted  . Hyperlipidemia 10/03/2015  . Iron deficiency anemia 10/03/2015  . Major depressive disorder, single episode, severe without psychotic features ([place]) 10/03/2014  . 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 Diabetes ([place]) 10/03/2014  . Hypertension 10/03/2014    Past Surgical History  Procedure Laterality Date  . No past surgeries      Current Outpatient Rx  Name  Route  Sig  Dispense  Refill  . amLODipine (NORVASC) 5 MG tablet   Oral   Take 5 mg by mouth daily.         Marland Kitchen atorvastatin (LIPITOR) 40 MG tablet   Oral   Take 1 tablet (40 mg total) by mouth daily.   90 tablet   1   . glipiZIDE (GLUCOTROL) 5 MG tablet   Oral   Take 2 tablets (10 mg total) by mouth daily.   180 tablet   0   . hydrochlorothiazide (HYDRODIURIL) 25 MG tablet   Oral   Take 25 mg by mouth.         . insulin detemir (LEVEMIR) 100 UNIT/ML injection   Subcutaneous   Inject 0.2 mLs (20 Units total) into the skin daily.   10 mL   11   . lansoprazole (PREVACID) 15 MG capsule   Oral   Take 30 mg by mouth daily at 12  noon. Reported on 10/03/2015         . lisinopril (PRINIVIL,ZESTRIL) 20 MG tablet   Oral   Take 20 mg by mouth.           Allergies Review of patient's allergies indicates no known allergies.  Family History  Problem Relation Age of Onset  . Alzheimer's disease Mother   . Hypertension Mother   . Diabetes Sister   . Gout Brother     Social History Social History  Substance Use Topics  . Smoking status: Never Smoker   . Smokeless tobacco: None  . Alcohol Use: No    Review of Systems  Constitutional: Negative for fever. Eyes: Negative for visual changes. ENT: Negative for sore throat. Cardiovascular: Negative for chest pain. Respiratory: Negative for shortness of breath. Gastrointestinal: Negative for abdominal pain, vomiting and diarrhea. Genitourinary: Negative for dysuria. Musculoskeletal: Negative for back pain. Skin: Negative for rash. Neurological: Negative for headaches, focal weakness or numbness. ____________________________________________  PHYSICAL EXAM:  VITAL SIGNS: ED Triage Vitals  Enc Vitals Group     BP 10/18/15 2146 140/83 mmHg     Pulse Rate 10/18/15 2146 94     Resp 10/18/15 2146 16     Temp 10/18/15 2146 98.6 F (37 C)  Temp Source 10/18/15 2146 Oral     SpO2 10/18/15 2146 100 %     Weight 10/18/15 2146 165 lb (74.844 kg)     Height 10/18/15 2146 5\' 3"  (1.6 m)     Head Cir --      Peak Flow --      Pain Score 10/18/15 2147 5     Pain Loc --      Pain Edu? --      Excl. in [place]? --    Constitutional: Alert and oriented. Well appearing and in no distress. Head: Normocephalic and atraumatic. Cardiovascular: Normal Distal pulses and capillary refill Respiratory: Normal respiratory effort.  Musculoskeletal: Normal composite fist and wrists dorsiflexion and plantar flexion on exam. Nontender with normal range of motion in all extremities.  Neurologic:  Normal gross sensation. Normal intrinsic and opposition testing. Normal speech and  language. No gross focal neurologic deficits are appreciated. Skin:  Skin is warm, dry and intact. No rash noted. Right forearm laceration to the middle third of the right forearm on the radial aspect. No active bleeding is noted. The laceration extends to the subcutaneous tissue. No underlying tendon injury is appreciated. ____________________________________________  PROCEDURES  Tdap  LACERATION REPAIR Performed by: Melvenia Needles Authorized by: Melvenia Needles Consent: Verbal consent obtained. Risks and benefits: risks, benefits and alternatives were discussed Consent given by: patient Patient identity confirmed: provided demographic data Prepped and Draped in normal sterile fashion Wound explored  Laceration Location: right forearm  Laceration Length: 4.4 cm  No Foreign Bodies seen or palpated  Anesthesia: local infiltration  Local anesthetic: lidocaine 1% w/o epinephrine  Anesthetic total: 4 ml  Irrigation method: syringe Amount of cleaning: standard  Skin closure: 4-0 nylon  Number of sutures: 5  Technique: horizontal mattress  Patient tolerance: Patient tolerated the procedure well with no immediate complications. ____________________________________________  INITIAL IMPRESSION / ASSESSMENT AND PLAN / ED COURSE  Patient with a forearm laceration status post suture repair. She is discharged with instructions on wound care management and will follow with the primary care provider in 10-12 days for suture removal. She is encouraged to the wound clean, dry, and covered. Tetanus booster is updated at this visit. ____________________________________________  FINAL CLINICAL IMPRESSION(S) / ED DIAGNOSES  Final diagnoses:  Forearm laceration, right, initial encounter     Melvenia Needles, PA-C 10/18/15 2335  Orbie Pyo, MD 10/18/15 [phone]

## 2015-10-18 NOTE — ED Notes (Signed)
Patient presents with laceration to her RFA. Patient reports that her sink broke and she was trying to fix it; lost balance and fell causing her to cut her arm.

## 2015-10-31 ENCOUNTER — Other Ambulatory Visit: Payer: Self-pay

## 2015-11-05 ENCOUNTER — Ambulatory Visit: Payer: Self-pay

## 2015-11-07 ENCOUNTER — Ambulatory Visit: Payer: Self-pay | Admitting: Nurse Practitioner

## 2015-11-07 VITALS — BP 116/75 | HR 102 | Temp 97.7°F | Wt 164.0 lb

## 2015-11-07 DIAGNOSIS — Z794 Long term (current) use of insulin: Principal | ICD-10-CM

## 2015-11-07 DIAGNOSIS — E119 Type 2 diabetes mellitus without complications: Secondary | ICD-10-CM

## 2015-11-07 LAB — GLUCOSE, POCT (MANUAL RESULT ENTRY): POC GLUCOSE: 126 mg/dL — AB (ref 70–99)

## 2015-11-07 MED ORDER — INSULIN DETEMIR 100 UNIT/ML ~~LOC~~ SOLN: 20.0000 [IU] | Freq: Every day | SUBCUTANEOUS | Status: DC

## 2015-11-07 NOTE — Progress Notes (Unsigned)
Pt here tonight, most recent a1c is 8.6, morning blood sugars have been in the 200-300's she is taking levemir 2 units each evening.        Have asked pt each time a.m. Blood sugar is > 200, to increase levemir dose by one unit, understanding veralized    Will see pt back in one month to assess her response to insulin management.

## 2015-11-20 ENCOUNTER — Other Ambulatory Visit: Payer: Self-pay | Admitting: Nurse Practitioner

## 2015-12-10 ENCOUNTER — Other Ambulatory Visit: Payer: Self-pay

## 2015-12-10 DIAGNOSIS — E119 Type 2 diabetes mellitus without complications: Secondary | ICD-10-CM

## 2015-12-10 MED ORDER — GLIPIZIDE 5 MG PO TABS: 10.0000 mg | ORAL_TABLET | Freq: Every day | ORAL | 0 refills | Status: DC

## 2015-12-12 ENCOUNTER — Ambulatory Visit: Payer: Self-pay

## 2015-12-12 ENCOUNTER — Ambulatory Visit: Payer: Self-pay | Admitting: Nurse Practitioner

## 2015-12-12 VITALS — BP 123/73 | HR 75 | Wt 164.0 lb

## 2015-12-12 DIAGNOSIS — E119 Type 2 diabetes mellitus without complications: Secondary | ICD-10-CM

## 2015-12-12 DIAGNOSIS — I10 Essential (primary) hypertension: Secondary | ICD-10-CM

## 2015-12-12 DIAGNOSIS — E782 Mixed hyperlipidemia: Secondary | ICD-10-CM

## 2015-12-12 LAB — GLUCOSE, POCT (MANUAL RESULT ENTRY): POC Glucose: 226 mg/dl — AB (ref 70–99)

## 2015-12-12 MED ORDER — NORTRIPTYLINE HCL 10 MG PO CAPS: 10.0000 mg | ORAL_CAPSULE | Freq: Every day | ORAL | 1 refills | Status: DC

## 2015-12-12 MED ORDER — LANSOPRAZOLE 15 MG PO CPDR: 30.0000 mg | DELAYED_RELEASE_CAPSULE | Freq: Every day | ORAL | 1 refills | Status: DC

## 2015-12-12 MED ORDER — INSULIN DETEMIR 100 UNIT/ML ~~LOC~~ SOLN: 12.0000 [IU] | Freq: Two times a day (BID) | SUBCUTANEOUS | 11 refills | Status: DC

## 2015-12-12 NOTE — Progress Notes (Signed)
SUGAR HAS BEEN UP AND DOWN, LAST A1C AT 8.6 IN MORNINGS BLOOD SUGARS > 200  NIGHT TIME BLOOD SUGARS AT 250  C/O FOOT PAIN AT NIGHT Has been anxious, between pain and anxiety has had difficulty sleeping  Does in home assistance  States she has not had prevacid, and is having severe reflux    Plan:    Will change levemir to bid dosing, 12 units in morning and 12 units in the morning,   Each morning that blood sugar is > 200 pt is to add one unit to the evening dose, she is willing to do 2 shots per day  Foot pain and insomnia:  Will implement nortriptyline 10 mg 1 to 2 capsules at bedtime  gerd will restart prevacid, 30 mg per day.      Will implement nortriptyline 10 mg 1 to 2 capsules at bedtime.    Will do labs in 2 months and see pt after labs

## 2016-02-06 ENCOUNTER — Other Ambulatory Visit: Payer: Self-pay

## 2016-02-06 ENCOUNTER — Other Ambulatory Visit: Payer: Self-pay | Admitting: Urology

## 2016-02-06 DIAGNOSIS — I10 Essential (primary) hypertension: Secondary | ICD-10-CM

## 2016-02-06 DIAGNOSIS — E785 Hyperlipidemia, unspecified: Secondary | ICD-10-CM

## 2016-02-06 DIAGNOSIS — E119 Type 2 diabetes mellitus without complications: Secondary | ICD-10-CM

## 2016-02-06 DIAGNOSIS — E782 Mixed hyperlipidemia: Secondary | ICD-10-CM

## 2016-02-06 DIAGNOSIS — Z794 Long term (current) use of insulin: Principal | ICD-10-CM

## 2016-02-07 LAB — COMPREHENSIVE METABOLIC PANEL
ALBUMIN: 4.4 g/dL (ref 3.6–4.8)
ALT: 17 IU/L (ref 0–32)
AST: 16 IU/L (ref 0–40)
Albumin/Globulin Ratio: 1.5 (ref 1.2–2.2)
Alkaline Phosphatase: 82 IU/L (ref 39–117)
BUN / CREAT RATIO: 23 (ref 12–28)
BUN: 32 mg/dL — AB (ref 8–27)
Bilirubin Total: 0.2 mg/dL (ref 0.0–1.2)
CO2: 27 mmol/L (ref 18–29)
CREATININE: 1.38 mg/dL — AB (ref 0.57–1.00)
Calcium: 9.9 mg/dL (ref 8.7–10.3)
Chloride: 97 mmol/L (ref 96–106)
GFR calc non Af Amer: 41 mL/min/{1.73_m2} — ABNORMAL LOW (ref >59)
GFR, EST AFRICAN AMERICAN: 48 mL/min/{1.73_m2} — AB (ref >59)
GLUCOSE: 219 mg/dL — AB (ref 65–99)
Globulin, Total: 2.9 g/dL (ref 1.5–4.5)
Potassium: 4.6 mmol/L (ref 3.5–5.2)
Sodium: 137 mmol/L (ref 134–144)
TOTAL PROTEIN: 7.3 g/dL (ref 6.0–8.5)

## 2016-02-07 LAB — HEMOGLOBIN A1C
Est. average glucose Bld gHb Est-mCnc: 214 mg/dL
Hgb A1c MFr Bld: 9.1 % — ABNORMAL HIGH (ref 4.8–5.6)

## 2016-02-07 LAB — LIPID PANEL
CHOL/HDL RATIO: 2.7 ratio (ref 0.0–4.4)
Cholesterol, Total: 146 mg/dL (ref 100–199)
HDL: 55 mg/dL (ref >39)
LDL CALC: 70 mg/dL (ref 0–99)
TRIGLYCERIDES: 103 mg/dL (ref 0–149)
VLDL Cholesterol Cal: 21 mg/dL (ref 5–40)

## 2016-02-13 ENCOUNTER — Ambulatory Visit: Payer: Self-pay | Admitting: Adult Health Nurse Practitioner

## 2016-02-13 VITALS — BP 129/76 | HR 97 | Ht 62.0 in | Wt 161.0 lb

## 2016-02-13 DIAGNOSIS — G4700 Insomnia, unspecified: Secondary | ICD-10-CM | POA: Insufficient documentation

## 2016-02-13 DIAGNOSIS — E119 Type 2 diabetes mellitus without complications: Secondary | ICD-10-CM

## 2016-02-13 DIAGNOSIS — Z794 Long term (current) use of insulin: Principal | ICD-10-CM

## 2016-02-13 DIAGNOSIS — N1832 Chronic kidney disease, stage 3b: Secondary | ICD-10-CM | POA: Insufficient documentation

## 2016-02-13 DIAGNOSIS — N183 Chronic kidney disease, stage 3 unspecified: Secondary | ICD-10-CM

## 2016-02-13 LAB — GLUCOSE, POCT (MANUAL RESULT ENTRY): POC GLUCOSE: 367 mg/dL — AB (ref 70–99)

## 2016-02-13 MED ORDER — INSULIN DETEMIR 100 UNIT/ML ~~LOC~~ SOLN: 14.0000 [IU] | Freq: Two times a day (BID) | SUBCUTANEOUS | 11 refills | Status: DC

## 2016-02-13 MED ORDER — GLIPIZIDE 10 MG PO TABS: 10.0000 mg | ORAL_TABLET | Freq: Two times a day (BID) | ORAL | 0 refills | Status: DC

## 2016-02-13 NOTE — Progress Notes (Signed)
  Patient: Kelly Doyle Female    DOB: 17-Oct-1955   61 y.o.   MRN: [id number] Visit Date: 02/13/2016  Today's Provider: Turner   Chief Complaint  Patient presents with  . Follow-up    for T2DM   Subjective:    HPI   INSOMNIA:  Doing well on nortriptyline-only taking10mg  at bedtime.  Sleeping well at night. Foot pain improved.   DM:  Increased levemir to 12 BID.  Taking glipizide BID.  Sugars have been 100-200 in the am when on glipizide-currently out of glipizide-sugars now in the 300s.  Pt states she cannot stick with dieting despite seeing good improvement in sugars with diet changes.  Current A1C 9.1.  CKD3- GFR 48 Denies episodes of hypoglycemia.    No Known Allergies Previous Medications   AMLODIPINE (NORVASC) 5 MG TABLET    TAKE ONE TABLET BY MOUTH EVERY DAY   ATORVASTATIN (LIPITOR) 40 MG TABLET    Take 1 tablet (40 mg total) by mouth daily.   GLIPIZIDE (GLUCOTROL) 10 MG TABLET    TAKE 1 TABLET BY MOUTH DAILY.   HYDROCHLOROTHIAZIDE (HYDRODIURIL) 25 MG TABLET    Take 25 mg by mouth.   INSULIN DETEMIR (LEVEMIR) 100 UNIT/ML INJECTION    Inject 0.12 mLs (12 Units total) into the skin 2 (two) times daily. Each day that the morning blood sugar is greater than 200, add one unit to the evening insulin detemir  dose   LANSOPRAZOLE (PREVACID) 15 MG CAPSULE    Take 2 capsules (30 mg total) by mouth daily at 12 noon. Reported on 11/07/2015   LISINOPRIL (PRINIVIL,ZESTRIL) 20 MG TABLET    Take 20 mg by mouth.   NORTRIPTYLINE (PAMELOR) 10 MG CAPSULE    Take 1 capsule (10 mg total) by mouth at bedtime. Take 1 to 2 capsules by mouth at bedtime    Review of Systems  All other systems reviewed and are negative.   Social History  Substance Use Topics  . Smoking status: Never Smoker  . Smokeless tobacco: Never Used  . Alcohol use No   Objective:   BP 129/76   Pulse 97   Ht 5\' 2"  (1.575 m)   Wt 161 lb (73 kg)   BMI 29.45 kg/m   Physical Exam       Assessment & Plan:         DM:  Not controlled.  Encourage diabetic diet and exercise.  Continue glipizide.  Increase levemir to 14 units BID.   CKD:  Monitor renal function in 3-4 months.  Renal dose medications as appropriate.   INSOMNIA:  Improved.  Continue current regimen.     [facility] [place]

## 2016-02-26 ENCOUNTER — Other Ambulatory Visit: Payer: Self-pay | Admitting: Internal Medicine

## 2016-02-27 ENCOUNTER — Other Ambulatory Visit: Payer: Self-pay

## 2016-02-27 ENCOUNTER — Other Ambulatory Visit: Payer: Self-pay | Admitting: Adult Health Nurse Practitioner

## 2016-02-27 MED ORDER — LISINOPRIL 20 MG PO TABS: 20.0000 mg | ORAL_TABLET | Freq: Every day | ORAL | 2 refills | Status: DC

## 2016-02-27 NOTE — Telephone Encounter (Signed)
Patient stopped in and mentioned that she was out of lisinopril and was not able to get more refills.  Medication Management directed her to come to [facility].  Spoke with patient and mentioned that it looked like Mandy had reorder, though it looked like RX had expired.  Mentioned I would have a provider reorder to make sure that it was received.  Patient to check with [facility] in morning.

## 2016-03-31 ENCOUNTER — Other Ambulatory Visit: Payer: Self-pay

## 2016-03-31 ENCOUNTER — Other Ambulatory Visit: Payer: Self-pay | Admitting: Urology

## 2016-03-31 DIAGNOSIS — I10 Essential (primary) hypertension: Secondary | ICD-10-CM

## 2016-03-31 MED ORDER — HYDROCHLOROTHIAZIDE 25 MG PO TABS: 25.0000 mg | ORAL_TABLET | Freq: Every day | ORAL | 2 refills | Status: DC

## 2016-04-02 ENCOUNTER — Other Ambulatory Visit: Payer: Self-pay

## 2016-04-02 DIAGNOSIS — Z794 Long term (current) use of insulin: Principal | ICD-10-CM

## 2016-04-02 DIAGNOSIS — E119 Type 2 diabetes mellitus without complications: Secondary | ICD-10-CM

## 2016-04-02 DIAGNOSIS — N183 Chronic kidney disease, stage 3 unspecified: Secondary | ICD-10-CM

## 2016-04-03 LAB — BASIC METABOLIC PANEL
BUN/Creatinine Ratio: 23 (ref 12–28)
BUN: 35 mg/dL — ABNORMAL HIGH (ref 8–27)
CALCIUM: 10.2 mg/dL (ref 8.7–10.3)
CHLORIDE: 105 mmol/L (ref 96–106)
CO2: 25 mmol/L (ref 18–29)
Creatinine, Ser: 1.49 mg/dL — ABNORMAL HIGH (ref 0.57–1.00)
GFR calc Af Amer: 43 mL/min/{1.73_m2} — ABNORMAL LOW (ref >59)
GFR, EST NON AFRICAN AMERICAN: 38 mL/min/{1.73_m2} — AB (ref >59)
Glucose: 112 mg/dL — ABNORMAL HIGH (ref 65–99)
POTASSIUM: 4.7 mmol/L (ref 3.5–5.2)
SODIUM: 145 mmol/L — AB (ref 134–144)

## 2016-04-03 LAB — HEMOGLOBIN A1C
ESTIMATED AVERAGE GLUCOSE: 189 mg/dL
HEMOGLOBIN A1C: 8.2 % — AB (ref 4.8–5.6)

## 2016-04-07 ENCOUNTER — Other Ambulatory Visit: Payer: Self-pay

## 2016-04-07 DIAGNOSIS — E119 Type 2 diabetes mellitus without complications: Secondary | ICD-10-CM

## 2016-04-07 DIAGNOSIS — Z794 Long term (current) use of insulin: Principal | ICD-10-CM

## 2016-04-08 LAB — URINALYSIS
BILIRUBIN UA: NEGATIVE
KETONES UA: NEGATIVE
Nitrite, UA: NEGATIVE
Protein, UA: NEGATIVE
RBC, UA: NEGATIVE
SPEC GRAV UA: 1.024 (ref 1.005–1.030)
Urobilinogen, Ur: 0.2 mg/dL (ref 0.2–1.0)
pH, UA: 5 (ref 5.0–7.5)

## 2016-04-16 ENCOUNTER — Ambulatory Visit: Payer: Self-pay | Admitting: Adult Health Nurse Practitioner

## 2016-04-16 VITALS — BP 145/83 | HR 89 | Temp 98.1°F | Wt 162.0 lb

## 2016-04-16 DIAGNOSIS — N183 Chronic kidney disease, stage 3 unspecified: Secondary | ICD-10-CM

## 2016-04-16 DIAGNOSIS — I10 Essential (primary) hypertension: Secondary | ICD-10-CM

## 2016-04-16 DIAGNOSIS — E782 Mixed hyperlipidemia: Secondary | ICD-10-CM

## 2016-04-16 DIAGNOSIS — E118 Type 2 diabetes mellitus with unspecified complications: Secondary | ICD-10-CM

## 2016-04-16 LAB — POCT GLUCOSE (DEVICE FOR HOME USE): GLUCOSE FASTING, POC: 89 mg/dL (ref 70–99)

## 2016-04-16 MED ORDER — METRONIDAZOLE 500 MG PO TABS: 500.0000 mg | ORAL_TABLET | Freq: Two times a day (BID) | ORAL | 0 refills | Status: DC

## 2016-04-16 MED ORDER — ATORVASTATIN CALCIUM 40 MG PO TABS: 40.0000 mg | ORAL_TABLET | Freq: Every day | ORAL | 1 refills | Status: DC

## 2016-04-16 NOTE — Progress Notes (Signed)
Patient: Kelly Doyle Female    DOB: 12-Apr-1956   60 y.o.   MRN: [id number] Visit Date: 04/16/2016  Today's Provider: Staci Acosta, NP   Chief Complaint  Patient presents with  . Follow-up    lab results   Subjective:    HPI   Here for lab results.   Pt states that she is just getting her BP medication today due to the refills being expired.  Last visit BP was 129/76.    Needs refill of her Lipitor.      A1C down to 8.2 from 9.1. Pt taking medications as directed.  Taking 12 units of levemir  BID.  Pt states that her fasting BS are running 160-200.  Pt states she does not check her sugars daily.    Pt states that she is not having vaginal discharge but notices a fishy order in her vagina.  She states that other people can smell it.  She states the smell gets worse when she gets excited or starts sweating.  Not sexually active for 3 months- prior to that same partner for 7 years.      No Known Allergies Previous Medications   AMLODIPINE (NORVASC) 5 MG TABLET    TAKE ONE TABLET BY MOUTH EVERY DAY   ATORVASTATIN (LIPITOR) 40 MG TABLET    Take 1 tablet (40 mg total) by mouth daily.   GLIPIZIDE (GLUCOTROL) 10 MG TABLET    Take 1 tablet (10 mg total) by mouth 2 (two) times daily before a meal.   HYDROCHLOROTHIAZIDE (HYDRODIURIL) 25 MG TABLET    Take 1 tablet (25 mg total) by mouth daily.   INSULIN DETEMIR (LEVEMIR) 100 UNIT/ML INJECTION    Inject 0.14 mLs (14 Units total) into the skin 2 (two) times daily. Each day that the morning blood sugar is greater than 200, add one unit to the evening insulin detemir  dose   LISINOPRIL (PRINIVIL,ZESTRIL) 20 MG TABLET    Take 1 tablet (20 mg total) by mouth daily.   OMEPRAZOLE PO    Take by mouth.    Review of Systems  All other systems reviewed and are negative.   Social History  Substance Use Topics  . Smoking status: Never Smoker  . Smokeless tobacco: Never Used  . Alcohol use No   Objective:   BP (!) 145/83  (BP Location: Left Arm, Patient Position: Sitting, Cuff Size: Normal)   Pulse 89   Temp 98.1 F (36.7 C) (Oral)   Wt 162 lb (73.5 kg)   BMI 29.63 kg/m   Physical Exam  Constitutional: She is oriented to person, place, and time. She appears well-developed and well-nourished.  HENT:  Head: Normocephalic and atraumatic.  Eyes: Pupils are equal, round, and reactive to light.  Neck: Normal range of motion. Neck supple.  Cardiovascular: Normal rate, regular rhythm and normal heart sounds.   Pulmonary/Chest: Effort normal and breath sounds normal.  Abdominal: Soft. Bowel sounds are normal.  Neurological: She is alert and oriented to person, place, and time.  Skin: Skin is warm and dry.  Psychiatric: She has a normal mood and affect.  Vitals reviewed.       Assessment & Plan:       DM:  Not controlled.  Encourage diabetic diet and exercise.  Continue current medication regimen and increase levemir to 14 units BID.  Monitor CBGs daily- fasting, record and bring log to next OV.    HTN:  Borderline today.  Continue current regimen.  Take  medications as directed.  Monitor salt intake.   HLD:   Continue current regimen. Refilled Lipitor.  Encourage low cholesterol, low fat diet and exercise.   CKD: BUN/Crt still elevated.  Monitor at next ov.   Will treat for BV.  Discussed SE of flagyl.  Pt referred to health department for STD screening.  Also let her know about the STD clinic.      FU in 3 months.    Staci Acosta, NP   [facility] [place]

## 2016-05-12 ENCOUNTER — Other Ambulatory Visit: Payer: Self-pay

## 2016-05-12 NOTE — Telephone Encounter (Signed)
Received PAP application from [facility] for Levemir FlexPen placed for provider to sign.

## 2016-05-13 ENCOUNTER — Telehealth: Payer: Self-pay

## 2016-05-13 NOTE — Telephone Encounter (Signed)
Contacted patient on 05/13/16 to make aware that her 05/14/16 eye appointment at 4pm needed to be rescheduled due to no provider.  Rescheduled for 05/28/16 at 4pm.

## 2016-05-14 ENCOUNTER — Ambulatory Visit: Payer: Self-pay | Admitting: Ophthalmology

## 2016-05-18 ENCOUNTER — Telehealth: Payer: Self-pay | Admitting: Pharmacist

## 2016-05-18 NOTE — Telephone Encounter (Signed)
Novo Nordisk PAP refill submitted to manufacturer today. Levemir Flexpen insulin, Inject 12 units into the skin two times a day, if BS>200 add 1 unit to evening dose. Max daily dose 25 units.

## 2016-05-28 ENCOUNTER — Ambulatory Visit: Payer: Self-pay | Admitting: Ophthalmology

## 2016-05-29 ENCOUNTER — Other Ambulatory Visit: Payer: Self-pay

## 2016-05-29 DIAGNOSIS — I10 Essential (primary) hypertension: Principal | ICD-10-CM

## 2016-05-29 DIAGNOSIS — E1159 Type 2 diabetes mellitus with other circulatory complications: Secondary | ICD-10-CM

## 2016-05-29 MED ORDER — AMLODIPINE BESYLATE 5 MG PO TABS: 5.0000 mg | ORAL_TABLET | Freq: Every day | ORAL | 0 refills | Status: DC

## 2016-05-29 MED ORDER — LISINOPRIL 20 MG PO TABS: 20.0000 mg | ORAL_TABLET | Freq: Every day | ORAL | 2 refills | Status: DC

## 2016-06-30 ENCOUNTER — Other Ambulatory Visit: Payer: Self-pay

## 2016-06-30 DIAGNOSIS — E119 Type 2 diabetes mellitus without complications: Secondary | ICD-10-CM

## 2016-06-30 MED ORDER — GLIPIZIDE 10 MG PO TABS: 10.0000 mg | ORAL_TABLET | Freq: Two times a day (BID) | ORAL | 0 refills | Status: DC

## 2016-07-09 ENCOUNTER — Other Ambulatory Visit: Payer: Self-pay

## 2016-07-09 DIAGNOSIS — E118 Type 2 diabetes mellitus with unspecified complications: Secondary | ICD-10-CM

## 2016-07-09 DIAGNOSIS — E782 Mixed hyperlipidemia: Secondary | ICD-10-CM

## 2016-07-10 ENCOUNTER — Other Ambulatory Visit: Payer: Self-pay

## 2016-07-10 DIAGNOSIS — E119 Type 2 diabetes mellitus without complications: Secondary | ICD-10-CM

## 2016-07-10 LAB — COMPREHENSIVE METABOLIC PANEL
ALK PHOS: 76 IU/L (ref 39–117)
ALT: 25 IU/L (ref 0–32)
AST: 13 IU/L (ref 0–40)
Albumin/Globulin Ratio: 1.6 (ref 1.2–2.2)
Albumin: 4.4 g/dL (ref 3.6–4.8)
BUN / CREAT RATIO: 19 (ref 12–28)
BUN: 33 mg/dL — AB (ref 8–27)
Bilirubin Total: 0.2 mg/dL (ref 0.0–1.2)
CHLORIDE: 97 mmol/L (ref 96–106)
CO2: 27 mmol/L (ref 18–29)
CREATININE: 1.74 mg/dL — AB (ref 0.57–1.00)
Calcium: 10.1 mg/dL (ref 8.7–10.3)
GFR calc Af Amer: 36 mL/min/{1.73_m2} — ABNORMAL LOW (ref >59)
GFR calc non Af Amer: 31 mL/min/{1.73_m2} — ABNORMAL LOW (ref >59)
GLUCOSE: 357 mg/dL — AB (ref 65–99)
Globulin, Total: 2.8 g/dL (ref 1.5–4.5)
Potassium: 4.8 mmol/L (ref 3.5–5.2)
Sodium: 139 mmol/L (ref 134–144)
Total Protein: 7.2 g/dL (ref 6.0–8.5)

## 2016-07-10 LAB — LIPID PANEL
CHOL/HDL RATIO: 2.7 ratio (ref 0.0–4.4)
CHOLESTEROL TOTAL: 155 mg/dL (ref 100–199)
HDL: 58 mg/dL (ref >39)
LDL Calculated: 73 mg/dL (ref 0–99)
Triglycerides: 121 mg/dL (ref 0–149)
VLDL Cholesterol Cal: 24 mg/dL (ref 5–40)

## 2016-07-10 LAB — HEMOGLOBIN A1C
ESTIMATED AVERAGE GLUCOSE: 243 mg/dL
HEMOGLOBIN A1C: 10.1 % — AB (ref 4.8–5.6)

## 2016-07-10 MED ORDER — GLIPIZIDE 10 MG PO TABS: 10.0000 mg | ORAL_TABLET | Freq: Two times a day (BID) | ORAL | 0 refills | Status: DC

## 2016-07-10 NOTE — Telephone Encounter (Signed)
Refill sent to pharmacy.   

## 2016-07-16 ENCOUNTER — Ambulatory Visit: Payer: Self-pay | Admitting: Adult Health Nurse Practitioner

## 2016-07-16 VITALS — BP 102/66 | HR 92 | Temp 97.8°F | Wt 159.6 lb

## 2016-07-16 DIAGNOSIS — I10 Essential (primary) hypertension: Secondary | ICD-10-CM

## 2016-07-16 DIAGNOSIS — E119 Type 2 diabetes mellitus without complications: Secondary | ICD-10-CM

## 2016-07-16 LAB — GLUCOSE, POCT (MANUAL RESULT ENTRY): POC Glucose: 358 mg/dl — AB (ref 70–99)

## 2016-07-16 MED ORDER — GLIPIZIDE 10 MG PO TABS: 10.0000 mg | ORAL_TABLET | Freq: Two times a day (BID) | ORAL | 1 refills | Status: DC

## 2016-07-16 MED ORDER — ATORVASTATIN CALCIUM 40 MG PO TABS: 40.0000 mg | ORAL_TABLET | Freq: Every day | ORAL | 1 refills | Status: AC

## 2016-07-16 MED ORDER — HYDROCHLOROTHIAZIDE 25 MG PO TABS: 25.0000 mg | ORAL_TABLET | Freq: Every day | ORAL | 2 refills | Status: DC

## 2016-07-16 MED ORDER — INSULIN DETEMIR 100 UNIT/ML ~~LOC~~ SOLN: 30.0000 [IU] | Freq: Every day | SUBCUTANEOUS | 11 refills | Status: DC

## 2016-07-16 NOTE — Progress Notes (Signed)
  Patient: Kelly Doyle Female    DOB: [birth year]   61 y.o.   MRN: [id number] Visit Date: 07/16/2016  Today's Provider: Staci Acosta, NP   Chief Complaint  Patient presents with  . Diabetes    prescription refill   Subjective:    HPI  HTN/HLD:   Taking medications as directed.    DM:  Taking oral medications as directed.  Pt states she is out of glipizide x 2 weeks.  She states that she is not taking her insulin like she should.  Levemir: 14 units BID.  Last A1C 10.1 up from 8.2.  Somewhat monitoring diet.  CBGs running 300s.       No Known Allergies Previous Medications   AMLODIPINE (NORVASC) 5 MG TABLET    Take 1 tablet (5 mg total) by mouth daily.   ATORVASTATIN (LIPITOR) 40 MG TABLET    Take 1 tablet (40 mg total) by mouth daily.   GLIPIZIDE (GLUCOTROL) 10 MG TABLET    Take 1 tablet (10 mg total) by mouth 2 (two) times daily before a meal.   HYDROCHLOROTHIAZIDE (HYDRODIURIL) 25 MG TABLET    Take 1 tablet (25 mg total) by mouth daily.   INSULIN DETEMIR (LEVEMIR) 100 UNIT/ML INJECTION    Inject 0.14 mLs (14 Units total) into the skin 2 (two) times daily. Each day that the morning blood sugar is greater than 200, add one unit to the evening insulin detemir  dose   LISINOPRIL (PRINIVIL,ZESTRIL) 20 MG TABLET    Take 1 tablet (20 mg total) by mouth daily.   METRONIDAZOLE (FLAGYL) 500 MG TABLET    Take 1 tablet (500 mg total) by mouth 2 (two) times daily.   OMEPRAZOLE PO    Take by mouth.    Review of Systems  All other systems reviewed and are negative.   Social History  Substance Use Topics  . Smoking status: Never Smoker  . Smokeless tobacco: Never Used  . Alcohol use No   Objective:   BP 102/66   Pulse 92   Temp 97.8 F (36.6 C)   Wt 159 lb 9.6 oz (72.4 kg)   BMI 29.19 kg/m   Physical Exam  Constitutional: She is oriented to person, place, and time. She appears well-developed and well-nourished.  HENT:  Head: Normocephalic and atraumatic.   Cardiovascular: Normal rate, regular rhythm and normal heart sounds.   Pulmonary/Chest: Effort normal and breath sounds normal.  Abdominal: Soft. Bowel sounds are normal.  Neurological: She is alert and oriented to person, place, and time.  Skin: Skin is warm and dry.  Vitals reviewed.       Assessment & Plan:         HTN:  Controlled.  Goal BP <140/80.  Continue current medication regimen.  Encourage low salt diet and exercise.   DM:   Not controlled.  Encourage diabetic diet and exercise.  Continue current medication regimen.  Will change Levemir to 30 units once daily.  Encouraged medication compliance.  Discussed feet care.  Continue CBG  HLD:  Controlled.  Continue current regimen.  Encourage low cholesterol, low fat diet and exercise.         Staci Acosta, NP   [facility] [place]

## 2016-07-30 ENCOUNTER — Ambulatory Visit: Payer: Self-pay | Admitting: Urology

## 2016-07-30 VITALS — BP 117/72 | HR 98 | Temp 97.8°F | Wt 170.7 lb

## 2016-07-30 DIAGNOSIS — N760 Acute vaginitis: Secondary | ICD-10-CM

## 2016-07-30 DIAGNOSIS — E119 Type 2 diabetes mellitus without complications: Secondary | ICD-10-CM

## 2016-07-30 LAB — GLUCOSE, POCT (MANUAL RESULT ENTRY): POC Glucose: 301 mg/dl — AB (ref 70–99)

## 2016-07-30 MED ORDER — METRONIDAZOLE 500 MG PO TABS: 500.0000 mg | ORAL_TABLET | Freq: Three times a day (TID) | ORAL | 0 refills | Status: DC

## 2016-07-30 NOTE — Progress Notes (Signed)
  Patient: Kelly Doyle Female    DOB: May 09, 1955   61 y.o.   MRN: [id number] Visit Date: 07/30/2016  Today's Provider: Zara Council, PA-C   Chief Complaint  Patient presents with  . Medication Refill    for infection  . Diabetes    8 years   Subjective:    HPI   patient with c/o vaginal discharge that is itching and smells like fish.    No Known Allergies Previous Medications   AMLODIPINE (NORVASC) 5 MG TABLET    Take 1 tablet (5 mg total) by mouth daily.   ATORVASTATIN (LIPITOR) 40 MG TABLET    Take 1 tablet (40 mg total) by mouth daily.   GLIPIZIDE (GLUCOTROL) 10 MG TABLET    Take 1 tablet (10 mg total) by mouth 2 (two) times daily before a meal.   HYDROCHLOROTHIAZIDE (HYDRODIURIL) 25 MG TABLET    Take 1 tablet (25 mg total) by mouth daily.   INSULIN DETEMIR (LEVEMIR) 100 UNIT/ML INJECTION    Inject 0.3 mLs (30 Units total) into the skin daily. Each day that the morning blood sugar is greater than 200, add one unit to the evening insulin detemir  dose   LISINOPRIL (PRINIVIL,ZESTRIL) 20 MG TABLET    Take 1 tablet (20 mg total) by mouth daily.   OMEPRAZOLE PO    Take by mouth.    Review of Systems  Social History  Substance Use Topics  . Smoking status: Never Smoker  . Smokeless tobacco: Never Used  . Alcohol use No   Objective:   BP 117/72   Pulse 98   Temp 97.8 F (36.6 C)   Wt 170 lb 11.2 oz (77.4 kg)   BMI 31.22 kg/m   Physical Exam      Assessment & Plan:     1. Bacterial vaginosis  - start Flagyl      Zara Council, PA-C   [facility]

## 2016-08-30 ENCOUNTER — Encounter: Payer: Self-pay | Admitting: *Deleted

## 2016-08-30 ENCOUNTER — Emergency Department
Admission: EM | Admit: 2016-08-30 | Discharge: 2016-08-30 | Disposition: A | Discharge status: Home / Self Care | Payer: Medicaid Other | Attending: Emergency Medicine | Admitting: Emergency Medicine

## 2016-08-30 DIAGNOSIS — I129 Hypertensive chronic kidney disease with stage 1 through stage 4 chronic kidney disease, or unspecified chronic kidney disease: Secondary | ICD-10-CM | POA: Diagnosis not present

## 2016-08-30 DIAGNOSIS — N183 Chronic kidney disease, stage 3 (moderate): Secondary | ICD-10-CM | POA: Insufficient documentation

## 2016-08-30 DIAGNOSIS — L03031 Cellulitis of right toe: Secondary | ICD-10-CM | POA: Insufficient documentation

## 2016-08-30 DIAGNOSIS — Z7984 Long term (current) use of oral hypoglycemic drugs: Secondary | ICD-10-CM | POA: Insufficient documentation

## 2016-08-30 DIAGNOSIS — E1122 Type 2 diabetes mellitus with diabetic chronic kidney disease: Secondary | ICD-10-CM | POA: Diagnosis not present

## 2016-08-30 DIAGNOSIS — M79674 Pain in right toe(s): Secondary | ICD-10-CM | POA: Diagnosis present

## 2016-08-30 MED ORDER — HYDROCODONE-ACETAMINOPHEN 5-325 MG PO TABS: 1.0000 | ORAL_TABLET | ORAL | 0 refills | Status: DC | PRN

## 2016-08-30 MED ORDER — OXYCODONE-ACETAMINOPHEN 5-325 MG PO TABS
1.0000 | ORAL_TABLET | Freq: Once | ORAL | Status: AC
  Administered 2016-08-30: 1 via ORAL
  Filled 2016-08-30: qty 1

## 2016-08-30 MED ORDER — CEPHALEXIN 500 MG PO CAPS: 500.0000 mg | ORAL_CAPSULE | Freq: Four times a day (QID) | ORAL | 0 refills | Status: DC

## 2016-08-30 NOTE — Discharge Instructions (Signed)
Soak right foot in warm water at least twice today. Begin taking antibiotics today as directed 4 times a day for the next 7 days. Keep area clean and dry. You may experience moderate amount of drainage today. Did not use any instruments or trim area. Follow-up with your primary care doctor at [facility] if any continued problems.

## 2016-08-30 NOTE — ED Notes (Signed)
See triage note  States right great toe swollen and tender   Denies any injury

## 2016-08-30 NOTE — ED Triage Notes (Signed)
States an ingrown toenail on his right great toe, states no relief from soaking it with epsom salt, ambulatory

## 2016-08-30 NOTE — ED Provider Notes (Signed)
[facility] Emergency Department Provider Note   ____________________________________________   First MD Initiated Contact with Patient 08/30/16 1238     (approximate)  I have reviewed the triage vital signs and the nursing notes.   HISTORY  Chief Complaint Nail Problem    HPI Kelly Doyle is a 61 y.o. female is here with complaint of right great toe pain. Patient states that she trimmed her toe nails last week and so began developing some pain with her right great toenail. Patient has been using soaks with Epsom salt with minimal improvement. She states that each time she soaks her foot she sees pus coming out of it. Patient is a diabetic. She denies any fever or chills. She has been taking over-the-counter medication for her pain with some improvement. Currently she rates her pain as 9/10.   Past Medical History:  Diagnosis Date  . Anxiety   . Diabetes mellitus without complication ([place])   . Hypertension     Patient Active Problem List   Diagnosis Date Noted  . Insomnia 02/13/2016  . CKD (chronic kidney disease) stage 3, GFR 30-59 ml/min 02/13/2016  . Hyperlipidemia 10/03/2015  . Iron deficiency anemia 10/03/2015  . Major depressive disorder, single episode, severe without psychotic features ([place]) 10/03/2014  . 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 Diabetes ([place]) 10/03/2014  . Hypertension 10/03/2014    Past Surgical History:  Procedure Laterality Date  . DILATION AND CURETTAGE OF UTERUS  04/20/1973  . NO PAST SURGERIES      Prior to Admission medications   Medication Sig Start Date End Date Taking? Authorizing Provider  amLODipine (NORVASC) 5 MG tablet Take 1 tablet (5 mg total) by mouth daily. 05/29/16   Doles-Johnson, Teah, NP  atorvastatin (LIPITOR) 40 MG tablet Take 1 tablet (40 mg total) by mouth daily. 07/16/16   Doles-Johnson, Teah, NP  cephALEXin (KEFLEX) 500 MG capsule Take 1 capsule (500 mg total) by mouth 4 (four) times daily.  08/30/16   Johnn Hai, PA-C  glipiZIDE (GLUCOTROL) 10 MG tablet Take 1 tablet (10 mg total) by mouth 2 (two) times daily before a meal. 07/16/16   Doles-Johnson, Teah, NP  hydrochlorothiazide (HYDRODIURIL) 25 MG tablet Take 1 tablet (25 mg total) by mouth daily. 07/16/16   Doles-Johnson, Teah, NP  HYDROcodone-acetaminophen (NORCO/VICODIN) 5-325 MG tablet Take 1 tablet by mouth every 4 (four) hours as needed for moderate pain. 08/30/16   Johnn Hai, PA-C  insulin detemir (LEVEMIR) 100 UNIT/ML injection Inject 0.3 mLs (30 Units total) into the skin daily. Each day that the morning blood sugar is greater than 200, add one unit to the evening insulin detemir  dose 07/16/16   Doles-Johnson, Teah, NP  lisinopril (PRINIVIL,ZESTRIL) 20 MG tablet Take 1 tablet (20 mg total) by mouth daily. 05/29/16   Doles-Johnson, Teah, NP  OMEPRAZOLE PO Take by mouth.    [provider]    Allergies Patient has no known allergies.  Family History  Problem Relation Age of Onset  . Alzheimer's disease Mother   . Hypertension Mother   . Diabetes Sister   . Gout Brother     Social History Social History  Substance Use Topics  . Smoking status: Never Smoker  . Smokeless tobacco: Never Used  . Alcohol use No    Review of Systems Constitutional: No fever/chills Cardiovascular: Denies chest pain. Respiratory: Denies shortness of breath. Gastrointestinal:  No nausea, no vomiting. Musculoskeletal: Right great toe pain. Skin: Positive for infection great toe.  Neurological: Negative for  focal weakness or numbness.   ____________________________________________   PHYSICAL EXAM:  VITAL SIGNS: ED Triage Vitals  Enc Vitals Group     BP 08/30/16 1229 139/70     Pulse Rate 08/30/16 1229 (!) 104     Resp 08/30/16 1229 16     Temp 08/30/16 1229 98.2 F (36.8 C)     Temp Source 08/30/16 1229 Oral     SpO2 08/30/16 1229 95 %     Weight 08/30/16 1223 165 lb (74.8 kg)     Height 08/30/16 1223  5\' 2"  (1.575 m)     Head Circumference --      Peak Flow --      Pain Score 08/30/16 1220 9     Pain Loc --      Pain Edu? --      Excl. in [place]? --     Constitutional: Alert and oriented. Well appearing and in no acute distress. Eyes: Conjunctivae are normal. PERRL. EOMI. Neck: No stridor.   Cardiovascular: Normal rate, regular rhythm. Grossly normal heart sounds.  Good peripheral circulation. Respiratory: Normal respiratory effort.  No retractions. Lungs CTAB. Musculoskeletal: No lower extremity tenderness nor edema.  No joint effusions. Neurologic:  Normal speech and language. No gross focal neurologic deficits are appreciated. No gait instability. Skin:  Right great toe with paronychia infection. There is tenderness and small amount of edema at the base of the nail. No evidence of ingrown toenail at this time. Skin is intact and there is no drainage present. Psychiatric: Mood and affect are normal. Speech and behavior are normal.  ____________________________________________   LABS (all labs ordered are listed, but only abnormal results are displayed)  Labs Reviewed - No data to display _ PROCEDURES  Procedure(s) performed: Incision was made with an 18-gauge needle at the base of the right great toenail. Moderate amount of purulent drainage was noted. Patient tolerated procedure well.  Procedures  Critical Care performed: No  ____________________________________________   INITIAL IMPRESSION / ASSESSMENT AND PLAN / ED COURSE  Pertinent labs & imaging results that were available during my care of the patient were reviewed by me and considered in my medical decision making (see chart for details).  Patient was given one Percocet while in the department. She tolerated the procedure for an I&D of her paronychia infection well. She is instructed to do at least 2 warm water soaks today with a without Epsom salts. She is given a prescription for Keflex 500 mg 4 times a day for 7  days. [facility] any [facility] problems or concerns. Patient was also discharged with up her scheduled for Norco if needed for pain.    ___________________________________________   FINAL CLINICAL IMPRESSION(S) / ED DIAGNOSES  Final diagnoses:  Paronychia of great toe, right      NEW MEDICATIONS STARTED DURING THIS VISIT:  Discharge Medication List as of 08/30/2016  2:03 PM    START taking these medications   Details  cephALEXin (KEFLEX) 500 MG capsule Take 1 capsule (500 mg total) by mouth 4 (four) times daily., Starting Sun 08/30/2016, Print    HYDROcodone-acetaminophen (NORCO/VICODIN) 5-325 MG tablet Take 1 tablet by mouth every 4 (four) hours as needed for moderate pain., Starting Sun 08/30/2016, Print         Note:  This document was prepared using Dragon voice recognition software and may include unintentional dictation errors.    Johnn Hai, PA-C 08/30/16 1601  Lisa Roca, MD 09/02/16 1210

## 2016-09-07 ENCOUNTER — Other Ambulatory Visit: Payer: Self-pay | Admitting: Urology

## 2016-09-07 DIAGNOSIS — E1159 Type 2 diabetes mellitus with other circulatory complications: Secondary | ICD-10-CM

## 2016-09-07 DIAGNOSIS — I10 Essential (primary) hypertension: Principal | ICD-10-CM

## 2016-09-07 DIAGNOSIS — I152 Hypertension secondary to endocrine disorders: Secondary | ICD-10-CM

## 2016-09-13 ENCOUNTER — Emergency Department
Admission: EM | Admit: 2016-09-13 | Discharge: 2016-09-13 | Disposition: A | Discharge status: Home / Self Care | Payer: Medicaid Other | Attending: Emergency Medicine | Admitting: Emergency Medicine

## 2016-09-13 ENCOUNTER — Encounter: Payer: Self-pay | Admitting: Emergency Medicine

## 2016-09-13 DIAGNOSIS — E1169 Type 2 diabetes mellitus with other specified complication: Secondary | ICD-10-CM | POA: Diagnosis not present

## 2016-09-13 DIAGNOSIS — N183 Chronic kidney disease, stage 3 (moderate): Secondary | ICD-10-CM | POA: Insufficient documentation

## 2016-09-13 DIAGNOSIS — J301 Allergic rhinitis due to pollen: Secondary | ICD-10-CM | POA: Diagnosis not present

## 2016-09-13 DIAGNOSIS — Z794 Long term (current) use of insulin: Secondary | ICD-10-CM | POA: Diagnosis not present

## 2016-09-13 DIAGNOSIS — R0981 Nasal congestion: Secondary | ICD-10-CM | POA: Insufficient documentation

## 2016-09-13 DIAGNOSIS — Z79899 Other long term (current) drug therapy: Secondary | ICD-10-CM | POA: Diagnosis not present

## 2016-09-13 DIAGNOSIS — Z7984 Long term (current) use of oral hypoglycemic drugs: Secondary | ICD-10-CM | POA: Insufficient documentation

## 2016-09-13 DIAGNOSIS — J3089 Other allergic rhinitis: Secondary | ICD-10-CM

## 2016-09-13 DIAGNOSIS — I129 Hypertensive chronic kidney disease with stage 1 through stage 4 chronic kidney disease, or unspecified chronic kidney disease: Secondary | ICD-10-CM | POA: Diagnosis not present

## 2016-09-13 MED ORDER — CETIRIZINE HCL 10 MG PO TABS: 10.0000 mg | ORAL_TABLET | Freq: Every day | ORAL | 0 refills | Status: DC

## 2016-09-13 MED ORDER — FLUTICASONE PROPIONATE 50 MCG/ACT NA SUSP: 2.0000 | Freq: Every day | NASAL | 2 refills | Status: DC

## 2016-09-13 NOTE — ED Notes (Signed)
FIRST NURSE NOTE: C/o neck pain, ear pain and nasal congestion for 3 days. Has tried coricidin OTC.

## 2016-09-13 NOTE — ED Notes (Signed)
Nasal congestion, bilateral ear fullness. Denies cough.

## 2016-09-13 NOTE — ED Triage Notes (Signed)
C/o sinus pain and neck pain for 3 days, states right ear pain congestion. Tried Coricidin with no relief

## 2016-09-13 NOTE — Discharge Instructions (Signed)
Please take medications as prescribed. Use nasal spray to help alleviate sinus pressure. If any fevers, chest pain or shortness of breath, return to the emergency department. Follow-up with PCP or walk-in clinic if no improvement in 3-4 days.

## 2016-09-13 NOTE — ED Provider Notes (Signed)
[facility] Provider Note   CSN: [id number] Arrival date & time: 09/13/16  1028     History   Chief Complaint Chief Complaint  Patient presents with  . Facial Pain  . Otalgia  . Nasal Congestion    HPI Kelly Doyle is a 61 y.o. female presents to the emergency department for evaluation of nasal congestion, sinus pressure, eye puffiness. Symptoms have been present for 3 days. She is taking over-the-counter Coricidin HBP with no improvement. She denies any cough congestion runny nose. She is not using any other medications. She denies any fevers, chest pain or shortness of breath. HPI  Past Medical History:  Diagnosis Date  . Anxiety   . Diabetes mellitus without complication ([place])   . Hypertension     Patient Active Problem List   Diagnosis Date Noted  . Insomnia 02/13/2016  . CKD (chronic kidney disease) stage 3, GFR 30-59 ml/min 02/13/2016  . Hyperlipidemia 10/03/2015  . Iron deficiency anemia 10/03/2015  . Major depressive disorder, single episode, severe without psychotic features ([place]) 10/03/2014  . 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 Diabetes ([place]) 10/03/2014  . Hypertension 10/03/2014    Past Surgical History:  Procedure Laterality Date  . DILATION AND CURETTAGE OF UTERUS  04/20/1973  . NO PAST SURGERIES      OB History    No data available       Home Medications    Prior to Admission medications   Medication Sig Start Date End Date Taking? Authorizing Provider  amLODipine (NORVASC) 5 MG tablet Take 1 tablet (5 mg total) by mouth daily. 05/29/16   Doles-Johnson, Teah, NP  atorvastatin (LIPITOR) 40 MG tablet Take 1 tablet (40 mg total) by mouth daily. 07/16/16   Doles-Johnson, Teah, NP  cephALEXin (KEFLEX) 500 MG capsule Take 1 capsule (500 mg total) by mouth 4 (four) times daily. 08/30/16   Johnn Hai, PA-C  cetirizine (ZYRTEC ALLERGY) 10 MG tablet Take 1 tablet (10 mg total) by mouth daily. 09/13/16   Duanne Guess, PA-C    fluticasone (FLONASE) 50 MCG/ACT nasal spray Place 2 sprays into both nostrils daily. 09/13/16 09/13/17  Duanne Guess, PA-C  glipiZIDE (GLUCOTROL) 10 MG tablet Take 1 tablet (10 mg total) by mouth 2 (two) times daily before a meal. 07/16/16   Doles-Johnson, Teah, NP  hydrochlorothiazide (HYDRODIURIL) 25 MG tablet Take 1 tablet (25 mg total) by mouth daily. 07/16/16   Doles-Johnson, Teah, NP  HYDROcodone-acetaminophen (NORCO/VICODIN) 5-325 MG tablet Take 1 tablet by mouth every 4 (four) hours as needed for moderate pain. 08/30/16   Johnn Hai, PA-C  insulin detemir (LEVEMIR) 100 UNIT/ML injection Inject 0.3 mLs (30 Units total) into the skin daily. Each day that the morning blood sugar is greater than 200, add one unit to the evening insulin detemir  dose 07/16/16   Doles-Johnson, Teah, NP  lisinopril (PRINIVIL,ZESTRIL) 20 MG tablet Take 1 tablet (20 mg total) by mouth daily. 05/29/16   Doles-Johnson, Teah, NP  OMEPRAZOLE PO Take by mouth.    [provider]    Family History Family History  Problem Relation Age of Onset  . Alzheimer's disease Mother   . Hypertension Mother   . Diabetes Sister   . Gout Brother     Social History Social History  Substance Use Topics  . Smoking status: Never Smoker  . Smokeless tobacco: Never Used  . Alcohol use No     Allergies   Patient has no known allergies.  Review of Systems Review of Systems  Constitutional: Negative for activity change, chills, fatigue and fever.  HENT: Positive for congestion and sinus pressure. Negative for rhinorrhea, sore throat and trouble swallowing.   Eyes: Negative for visual disturbance.  Respiratory: Negative for cough, chest tightness and shortness of breath.   Cardiovascular: Negative for chest pain and leg swelling.  Gastrointestinal: Negative for abdominal pain, diarrhea, nausea and vomiting.  Genitourinary: Negative for dysuria.  Musculoskeletal: Negative for arthralgias and gait problem.   Skin: Negative for rash.  Neurological: Negative for weakness, numbness and headaches.  Hematological: Negative for adenopathy.  Psychiatric/Behavioral: Negative for agitation, behavioral problems and confusion.     Physical Exam Updated Vital Signs There were no vitals taken for this visit.  Physical Exam  Constitutional: She is oriented to person, place, and time. She appears well-developed and well-nourished. No distress.  HENT:  Head: Normocephalic and atraumatic.  Mouth/Throat: Oropharynx is clear and moist. No oropharyngeal exudate.  No pharyngeal erythema or exudates. Uvula midline. No sign of abscess. Mild bilateral maxillary sinus tenderness.  Eyes: Conjunctivae and EOM are normal. Pupils are equal, round, and reactive to light. Right eye exhibits no discharge. Left eye exhibits no discharge.  No conjunctival erythema or periorbital erythema or swelling.  Neck: Normal range of motion. Neck supple.  Cardiovascular: Normal rate, regular rhythm and intact distal pulses.   Pulmonary/Chest: Effort normal and breath sounds normal. No respiratory distress. She exhibits no tenderness.  Abdominal: Soft. She exhibits no distension. There is no tenderness.  Musculoskeletal: Normal range of motion. She exhibits no edema.  Lymphadenopathy:    She has no cervical adenopathy.  Neurological: She is alert and oriented to person, place, and time. She has normal reflexes.  Skin: Skin is warm and dry.  Psychiatric: She has a normal mood and affect. Her behavior is normal. Thought content normal.     ED Treatments / Results  Labs (all labs ordered are listed, but only abnormal results are displayed) Labs Reviewed - No data to display  EKG  EKG Interpretation None       Radiology No results found.  Procedures Procedures (including critical care time)  Medications Ordered in ED Medications - No data to display   Initial Impression / Assessment and Plan / ED Course  I have  reviewed the triage vital signs and the nursing notes.  Pertinent labs & imaging results that were available during my care of the patient were reviewed by me and considered in my medical decision making (see chart for details).   61 year old female with nasal congestion, allergies. She will start Flonase and Zyrtec. She will take Tylenol for pain. She is educated on signs and symptoms return to the ED for.  Final Clinical Impressions(s) / ED Diagnoses   Final diagnoses:  Nasal congestion  Seasonal allergic rhinitis due to other allergic trigger    New Prescriptions Discharge Medication List as of 09/13/2016 11:51 AM    START taking these medications   Details  cetirizine (ZYRTEC ALLERGY) 10 MG tablet Take 1 tablet (10 mg total) by mouth daily., Starting Sun 09/13/2016, Print    fluticasone (FLONASE) 50 MCG/ACT nasal spray Place 2 sprays into both nostrils daily., Starting Sun 09/13/2016, Until Mon 09/13/2017, Print         Duanne Guess, PA-C 09/13/16 1208    Nance Pear, MD 09/13/16 [phone]

## 2016-09-29 ENCOUNTER — Emergency Department: Payer: Medicare Other

## 2016-09-29 ENCOUNTER — Emergency Department
Admission: EM | Admit: 2016-09-29 | Discharge: 2016-09-29 | Disposition: A | Discharge status: Home / Self Care | Payer: Medicare Other | Attending: Emergency Medicine | Admitting: Emergency Medicine

## 2016-09-29 ENCOUNTER — Encounter: Payer: Self-pay | Admitting: Emergency Medicine

## 2016-09-29 DIAGNOSIS — M436 Torticollis: Secondary | ICD-10-CM

## 2016-09-29 DIAGNOSIS — Z794 Long term (current) use of insulin: Secondary | ICD-10-CM | POA: Insufficient documentation

## 2016-09-29 DIAGNOSIS — I129 Hypertensive chronic kidney disease with stage 1 through stage 4 chronic kidney disease, or unspecified chronic kidney disease: Secondary | ICD-10-CM | POA: Diagnosis not present

## 2016-09-29 DIAGNOSIS — N183 Chronic kidney disease, stage 3 (moderate): Secondary | ICD-10-CM | POA: Insufficient documentation

## 2016-09-29 DIAGNOSIS — Z79899 Other long term (current) drug therapy: Secondary | ICD-10-CM | POA: Diagnosis not present

## 2016-09-29 DIAGNOSIS — E1122 Type 2 diabetes mellitus with diabetic chronic kidney disease: Secondary | ICD-10-CM | POA: Insufficient documentation

## 2016-09-29 DIAGNOSIS — M542 Cervicalgia: Secondary | ICD-10-CM | POA: Diagnosis present

## 2016-09-29 MED ORDER — CYCLOBENZAPRINE HCL 10 MG PO TABS: 10.0000 mg | ORAL_TABLET | Freq: Three times a day (TID) | ORAL | 0 refills | Status: DC | PRN

## 2016-09-29 MED ORDER — NAPROXEN 500 MG PO TABS: 500.0000 mg | ORAL_TABLET | Freq: Two times a day (BID) | ORAL | Status: DC

## 2016-09-29 MED ORDER — TRAMADOL HCL 50 MG PO TABS: 50.0000 mg | ORAL_TABLET | Freq: Four times a day (QID) | ORAL | 0 refills | Status: AC | PRN

## 2016-09-29 NOTE — ED Provider Notes (Signed)
[facility] Emergency Department Provider Note   ____________________________________________   First MD Initiated Contact with Patient 09/29/16 [phone]     (approximate)  I have reviewed the triage vital signs and the nursing notes.   HISTORY  Chief Complaint Torticollis    HPI Kelly Doyle is a 61 y.o. female patient complain of stiff neck 2 weeks. Patient denies any provocative incident for this complaint. Patient state her neck pain increases at night.Patient rates pain as a 7/10. Patient described a pain as "achy". No palliative measures for her complaint.   Past Medical History:  Diagnosis Date  . Anxiety   . Diabetes mellitus without complication ([place])   . Hypertension     Patient Active Problem List   Diagnosis Date Noted  . Insomnia 02/13/2016  . CKD (chronic kidney disease) stage 3, GFR 30-59 ml/min 02/13/2016  . Hyperlipidemia 10/03/2015  . Iron deficiency anemia 10/03/2015  . Major depressive disorder, single episode, severe without psychotic features ([place]) 10/03/2014  . 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 Diabetes ([place]) 10/03/2014  . Hypertension 10/03/2014    Past Surgical History:  Procedure Laterality Date  . DILATION AND CURETTAGE OF UTERUS  04/20/1973  . NO PAST SURGERIES      Prior to Admission medications   Medication Sig Start Date End Date Taking? Authorizing Provider  amLODipine (NORVASC) 5 MG tablet TAKE ONE TABLET BY MOUTH EVERY DAY 09/14/16   McGowan, Larene Beach A, PA-C  atorvastatin (LIPITOR) 40 MG tablet Take 1 tablet (40 mg total) by mouth daily. 07/16/16   Doles-Johnson, Teah, NP  cephALEXin (KEFLEX) 500 MG capsule Take 1 capsule (500 mg total) by mouth 4 (four) times daily. 08/30/16   Johnn Hai, PA-C  cetirizine (ZYRTEC ALLERGY) 10 MG tablet Take 1 tablet (10 mg total) by mouth daily. 09/13/16   Duanne Guess, PA-C  cyclobenzaprine (FLEXERIL) 10 MG tablet Take 1 tablet (10 mg total) by mouth 3 (three)  times daily as needed. 09/29/16   Sable Feil, PA-C  fluticasone (FLONASE) 50 MCG/ACT nasal spray Place 2 sprays into both nostrils daily. 09/13/16 09/13/17  Duanne Guess, PA-C  glipiZIDE (GLUCOTROL) 10 MG tablet Take 1 tablet (10 mg total) by mouth 2 (two) times daily before a meal. 07/16/16   Doles-Johnson, Teah, NP  hydrochlorothiazide (HYDRODIURIL) 25 MG tablet Take 1 tablet (25 mg total) by mouth daily. 07/16/16   Doles-Johnson, Teah, NP  HYDROcodone-acetaminophen (NORCO/VICODIN) 5-325 MG tablet Take 1 tablet by mouth every 4 (four) hours as needed for moderate pain. 08/30/16   Johnn Hai, PA-C  insulin detemir (LEVEMIR) 100 UNIT/ML injection Inject 0.3 mLs (30 Units total) into the skin daily. Each day that the morning blood sugar is greater than 200, add one unit to the evening insulin detemir  dose 07/16/16   Doles-Johnson, Teah, NP  lisinopril (PRINIVIL,ZESTRIL) 20 MG tablet TAKE ONE TABLET BY MOUTH EVERY DAY 09/14/16   McGowan, Larene Beach A, PA-C  naproxen (NAPROSYN) 500 MG tablet Take 1 tablet (500 mg total) by mouth 2 (two) times daily with a meal. 09/29/16   Sable Feil, PA-C  OMEPRAZOLE PO Take by mouth.    [provider]  traMADol (ULTRAM) 50 MG tablet Take 1 tablet (50 mg total) by mouth every 6 (six) hours as needed. 09/29/16 09/29/17  Sable Feil, PA-C    Allergies Patient has no known allergies.  Family History  Problem Relation Age of Onset  . Alzheimer's disease Mother   .  Hypertension Mother   . Diabetes Sister   . Gout Brother     Social History Social History  Substance Use Topics  . Smoking status: Never Smoker  . Smokeless tobacco: Never Used  . Alcohol use No    Review of Systems  Constitutional: No fever/chills Eyes: No visual changes. ENT: No sore throat. Cardiovascular: Denies chest pain. Respiratory: Denies shortness of breath. Gastrointestinal: No abdominal pain.  No nausea, no vomiting.  No diarrhea.  No  constipation. Genitourinary: Negative for dysuria. Musculoskeletal: Negative for back pain. Skin: Negative for rash. Neurological: Negative for headaches, focal weakness or numbness. Psychiatric:Anxiety Endocrine:Diabetes, hyperlipidemia, and hypertension. ____________________________________________   PHYSICAL EXAM:  VITAL SIGNS: ED Triage Vitals  Enc Vitals Group     BP 09/29/16 0931 127/71     Pulse Rate 09/29/16 0931 83     Resp 09/29/16 0931 16     Temp 09/29/16 0931 97.8 F (36.6 C)     Temp Source 09/29/16 0931 Oral     SpO2 09/29/16 0931 98 %     Weight 09/29/16 0929 164 lb (74.4 kg)     Height 09/29/16 0929 5\' 2"  (1.575 m)     Head Circumference --      Peak Flow --      Pain Score --      Pain Loc --      Pain Edu? --      Excl. in [place]? --     Constitutional: Alert and oriented. Well appearing and in no acute distress. Eyes: Conjunctivae are normal. PERRL. EOMI. Head: Atraumatic. Nose: No congestion/rhinnorhea. Mouth/Throat: Mucous membranes are moist.  Oropharynx non-erythematous. Neck: No stridor.  No cervical spine tenderness to palpation. Decreased right lateral movements of the neck Cardiovascular: Normal rate, regular rhythm. Grossly normal heart sounds.  Good peripheral circulation. Respiratory: Normal respiratory effort.  No retractions. Lungs CTAB. Gastrointestinal: Soft and nontender. No distention. No abdominal bruits. No CVA tenderness. Musculoskeletal: No lower extremity tenderness nor edema.  No joint effusions. Neurologic:  Normal speech and language. No gross focal neurologic deficits are appreciated. No gait instability. Skin:  Skin is warm, dry and intact. No rash noted. Psychiatric: Mood and affect are normal. Speech and behavior are normal.  ____________________________________________   LABS (all labs ordered are listed, but only abnormal results are displayed)  Labs Reviewed - No data to  display ____________________________________________  EKG   ____________________________________________  RADIOLOGY  Dg Cervical Spine Complete  Result Date: 09/29/2016 CLINICAL DATA:  Stiff neck and left neck discomfort for the past 2 weeks. EXAM: CERVICAL SPINE - COMPLETE 4+ VIEW COMPARISON:  None in PACs FINDINGS: The cervical vertebral bodies are preserved in height. There is loss of the normal cervical lordosis. There is moderate disc space narrowing and endplate osteophyte formation at C4-5, C5-6, and C6-7. There is no perched facet. The positioning of the oblique views is limited due to the patient's neck stiffness. The odontoid is intact. The prevertebral soft tissue spaces are normal. 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 There is no compression fracture. Evaluation of the neural foramina is limited by the patient's symptoms. If the patient's discomfort persists or if there are radicular symptoms, MRI of the cervical spine would be a useful next imaging step. Electronically Signed   By: David  Martinique M.D.   On: 09/29/2016 10:34    _Multilevel degenerative changes in the cervical spine.  ___________________________________________   PROCEDURES  Procedure(s) performed: None  Procedures  Critical Care performed: No  ____________________________________________   INITIAL IMPRESSION / ASSESSMENT AND PLAN / ED COURSE  Pertinent labs & imaging results that were available during my care of the patient were reviewed by me and considered in my medical decision making (see chart for details).  Torticollis. Discussed x-ray finding with patient. Patient given discharge care instructions. Patient advised to follow with PCP for continual care.      ____________________________________________   FINAL CLINICAL IMPRESSION(S) / ED DIAGNOSES  Final diagnoses:  Torticollis      NEW MEDICATIONS STARTED DURING THIS VISIT:  New  Prescriptions   CYCLOBENZAPRINE (FLEXERIL) 10 MG TABLET    Take 1 tablet (10 mg total) by mouth 3 (three) times daily as needed.   NAPROXEN (NAPROSYN) 500 MG TABLET    Take 1 tablet (500 mg total) by mouth 2 (two) times daily with a meal.   TRAMADOL (ULTRAM) 50 MG TABLET    Take 1 tablet (50 mg total) by mouth every 6 (six) hours as needed.     Note:  This document was prepared using Dragon voice recognition software and may include unintentional dictation errors.    Sable Feil, PA-C 09/29/16 1044    Lavonia Drafts, MD 09/29/16 [phone]

## 2016-09-29 NOTE — ED Triage Notes (Signed)
C/O stiff neck to left neck x 2 weeks.  Worse at night time.

## 2016-09-29 NOTE — ED Notes (Signed)
See triage note  States she is having some discomfort in neck  Thinks she may have slept wrong

## 2016-10-08 ENCOUNTER — Other Ambulatory Visit: Payer: Self-pay

## 2016-10-15 ENCOUNTER — Ambulatory Visit: Payer: Self-pay

## 2017-06-23 ENCOUNTER — Encounter
Admission: RE | Disposition: A | Discharge status: Home / Self Care | Payer: Self-pay | Source: Ambulatory Visit | Attending: Internal Medicine

## 2017-06-23 ENCOUNTER — Ambulatory Visit
Admission: RE | Admit: 2017-06-23 | Discharge: 2017-06-23 | Disposition: A | Discharge status: Home / Self Care | Payer: Medicare Other | Source: Ambulatory Visit | Attending: Internal Medicine | Admitting: Internal Medicine

## 2017-06-23 ENCOUNTER — Ambulatory Visit: Payer: Medicare Other | Admitting: Certified Registered Nurse Anesthetist

## 2017-06-23 ENCOUNTER — Other Ambulatory Visit: Payer: Self-pay

## 2017-06-23 ENCOUNTER — Encounter: Payer: Self-pay | Admitting: Certified Registered Nurse Anesthetist

## 2017-06-23 DIAGNOSIS — Z1211 Encounter for screening for malignant neoplasm of colon: Secondary | ICD-10-CM | POA: Insufficient documentation

## 2017-06-23 DIAGNOSIS — I10 Essential (primary) hypertension: Secondary | ICD-10-CM | POA: Diagnosis not present

## 2017-06-23 DIAGNOSIS — D123 Benign neoplasm of transverse colon: Secondary | ICD-10-CM | POA: Insufficient documentation

## 2017-06-23 DIAGNOSIS — Z79899 Other long term (current) drug therapy: Secondary | ICD-10-CM | POA: Diagnosis not present

## 2017-06-23 DIAGNOSIS — K621 Rectal polyp: Secondary | ICD-10-CM | POA: Insufficient documentation

## 2017-06-23 DIAGNOSIS — K219 Gastro-esophageal reflux disease without esophagitis: Secondary | ICD-10-CM | POA: Insufficient documentation

## 2017-06-23 DIAGNOSIS — Z794 Long term (current) use of insulin: Secondary | ICD-10-CM | POA: Insufficient documentation

## 2017-06-23 DIAGNOSIS — D120 Benign neoplasm of cecum: Secondary | ICD-10-CM | POA: Diagnosis not present

## 2017-06-23 DIAGNOSIS — K640 First degree hemorrhoids: Secondary | ICD-10-CM | POA: Insufficient documentation

## 2017-06-23 DIAGNOSIS — D125 Benign neoplasm of sigmoid colon: Secondary | ICD-10-CM | POA: Insufficient documentation

## 2017-06-23 DIAGNOSIS — F419 Anxiety disorder, unspecified: Secondary | ICD-10-CM | POA: Insufficient documentation

## 2017-06-23 DIAGNOSIS — E119 Type 2 diabetes mellitus without complications: Secondary | ICD-10-CM | POA: Insufficient documentation

## 2017-06-23 HISTORY — DX: Unspecified osteoarthritis, unspecified site: M19.90

## 2017-06-23 HISTORY — DX: Gastro-esophageal reflux disease without esophagitis: K21.9

## 2017-06-23 HISTORY — PX: COLONOSCOPY WITH PROPOFOL: SHX5780

## 2017-06-23 LAB — GLUCOSE, CAPILLARY: GLUCOSE-CAPILLARY: 258 mg/dL — AB (ref 65–99)

## 2017-06-23 SURGERY — COLONOSCOPY WITH PROPOFOL
Anesthesia: General

## 2017-06-23 MED ORDER — SODIUM CHLORIDE 0.9 % IV SOLN
INTRAVENOUS | Status: DC
  Administered 2017-06-23: 08:00:00 via INTRAVENOUS

## 2017-06-23 MED ORDER — PROPOFOL 10 MG/ML IV BOLUS
INTRAVENOUS | Status: DC | PRN
  Administered 2017-06-23: 50 mg via INTRAVENOUS

## 2017-06-23 MED ORDER — METHYLENE BLUE 0.5 % INJ SOLN
INTRAVENOUS | Status: DC | PRN
  Administered 2017-06-23: 1 mL via SUBMUCOSAL

## 2017-06-23 MED ORDER — METHYLENE BLUE 1 % INJ SOLN
INTRAMUSCULAR | Status: AC
  Filled 2017-06-23: qty 10

## 2017-06-23 MED ORDER — PHENYLEPHRINE HCL 10 MG/ML IJ SOLN
INTRAMUSCULAR | Status: AC
  Filled 2017-06-23: qty 1

## 2017-06-23 MED ORDER — PROPOFOL 500 MG/50ML IV EMUL
INTRAVENOUS | Status: DC | PRN
  Administered 2017-06-23: 185 ug/kg/min via INTRAVENOUS

## 2017-06-23 MED ORDER — PROPOFOL 500 MG/50ML IV EMUL
INTRAVENOUS | Status: AC
  Filled 2017-06-23: qty 50

## 2017-06-23 MED ORDER — EPHEDRINE SULFATE 50 MG/ML IJ SOLN
INTRAMUSCULAR | Status: AC
  Filled 2017-06-23: qty 1

## 2017-06-23 MED ORDER — PROPOFOL 10 MG/ML IV BOLUS
INTRAVENOUS | Status: AC
  Filled 2017-06-23: qty 20

## 2017-06-23 MED ORDER — LIDOCAINE HCL (CARDIAC) 20 MG/ML IV SOLN
INTRAVENOUS | Status: DC | PRN
  Administered 2017-06-23: 50 mg via INTRAVENOUS

## 2017-06-23 MED ORDER — LIDOCAINE HCL (PF) 2 % IJ SOLN
INTRAMUSCULAR | Status: AC
  Filled 2017-06-23: qty 10

## 2017-06-23 NOTE — H&P (Signed)
Outpatient short stay form Pre-procedure 06/23/2017 8:02 AM [place] Sees K. Alice Reichert, M.D.  Primary Physician: Dr. Gwynneth Aliment  Reason for visit:  Colon cancer screening.  History of present illness:  Patient presents for colonoscopy for screening/surveillance. The patient denies complaints of abdominal pain, significant change in bowel habits, or rectal bleeding.     Current Facility-Administered Medications:  .  0.9 %  sodium chloride infusion, , Intravenous, Continuous, [place], Benay Pike, MD, Last Rate: 20 mL/hr at 06/23/17 0745  Medications Prior to Admission  Medication Sig Dispense Refill Last Dose  . amLODipine (NORVASC) 5 MG tablet TAKE ONE TABLET BY MOUTH EVERY DAY 90 tablet 0 06/21/2017 at Unknown time  . atorvastatin (LIPITOR) 40 MG tablet Take 1 tablet (40 mg total) by mouth daily. 90 tablet 1 06/21/2017 at Unknown time  . cetirizine (ZYRTEC ALLERGY) 10 MG tablet Take 1 tablet (10 mg total) by mouth daily. 20 tablet 0 06/21/2017 at Unknown time  . glipiZIDE (GLUCOTROL) 10 MG tablet Take 1 tablet (10 mg total) by mouth 2 (two) times daily before a meal. 180 tablet 1 Past Week at Unknown time  . hydrochlorothiazide (HYDRODIURIL) 25 MG tablet Take 1 tablet (25 mg total) by mouth daily. 90 tablet 2 06/21/2017 at Unknown time  . HYDROcodone-acetaminophen (NORCO/VICODIN) 5-325 MG tablet Take 1 tablet by mouth every 4 (four) hours as needed for moderate pain. 15 tablet 0 06/21/2017 at Unknown time  . insulin detemir (LEVEMIR) 100 UNIT/ML injection Inject 0.3 mLs (30 Units total) into the skin daily. Each day that the morning blood sugar is greater than 200, add one unit to the evening insulin detemir  dose 10 mL 11 06/21/2017 at Unknown time  . lisinopril (PRINIVIL,ZESTRIL) 20 MG tablet TAKE ONE TABLET BY MOUTH EVERY DAY 90 tablet 0 Past Week at Unknown time  . naproxen (NAPROSYN) 500 MG tablet Take 1 tablet (500 mg total) by mouth 2 (two) times daily with a meal. 20 tablet 00 06/21/2017 at Unknown time  .  OMEPRAZOLE PO Take by mouth.   06/21/2017 at Unknown time  . traMADol (ULTRAM) 50 MG tablet Take 1 tablet (50 mg total) by mouth every 6 (six) hours as needed. 20 tablet 0 06/21/2017 at Unknown time  . cephALEXin (KEFLEX) 500 MG capsule Take 1 capsule (500 mg total) by mouth 4 (four) times daily. (Patient not taking: Reported on 06/23/2017) 28 capsule 0 Not Taking at Unknown time  . cyclobenzaprine (FLEXERIL) 10 MG tablet Take 1 tablet (10 mg total) by mouth 3 (three) times daily as needed. (Patient not taking: Reported on 06/23/2017) 15 tablet 0 Completed Course at Unknown time  . fluticasone (FLONASE) 50 MCG/ACT nasal spray Place 2 sprays into both nostrils daily. 1 g 2 06/21/2017     No Known Allergies   Past Medical History:  Diagnosis Date  . Anxiety   . Arthritis   . Diabetes mellitus without complication ([place])   . GERD (gastroesophageal reflux disease)   . Hypertension     Review of systems:   Negative.   Physical Exam  Gen: Alert, oriented. Appears stated age.  HEENT: [place]/AT. PERRLA. Lungs: CTA, no wheezes. CV: RR nl S1, S2. Abd: soft, benign, no masses. BS+ Ext: No edema. Pulses 2+    Planned procedures: Colonoscopy. The patient understands the nature of the planned procedure, indications, risks, alternatives and potential complications including but not limited to bleeding, infection, perforation, damage to internal organs and possible oversedation/side effects from anesthesia. The patient agrees and gives consent  to proceed.  Please refer to procedure notes for findings, recommendations and patient disposition/instructions.    Miliana Gangwer K. Alice Reichert, M.D. Gastroenterology 06/23/2017  8:02 AM

## 2017-06-23 NOTE — Op Note (Signed)
[facility] Gastroenterology Patient Name: Kelly Doyle Procedure Date: 06/23/2017 7:30 AM MRN: [id number] Account #: [id number] Date of Birth: 1956/03/08 Admit Type: Outpatient Age: 62 Room: [facility] ENDO ROOM 4 Gender: Female Note Status: Finalized Procedure:            Colonoscopy Indications:          Screening for colorectal malignant neoplasm Providers:            Benay Pike. Tracker Mance MD, MD Medicines:            Propofol per Anesthesia Complications:        No immediate complications. Procedure:            Pre-Anesthesia Assessment:                       - The risks and benefits of the procedure and the                        sedation options and risks were discussed with the                        patient. All questions were answered and informed                        consent was obtained.                       - Patient identification and proposed procedure were                        verified prior to the procedure by the nurse. The                        procedure was verified in the procedure room.                       After obtaining informed consent, the colonoscope was                        passed under direct vision. Throughout the procedure,                        the patient's blood pressure, pulse, and oxygen                        saturations were monitored continuously. The                        Colonoscope was introduced through the anus and                        advanced to the the cecum, identified by appendiceal                        orifice and ileocecal valve. The colonoscopy was                        performed without difficulty. The patient tolerated the                        procedure well. The quality of the  bowel preparation                        was good. The ileocecal valve, appendiceal orifice, and                        rectum were photographed. Findings:      The perianal and digital rectal examinations were normal.  Pertinent       negatives include normal sphincter tone and no palpable rectal lesions.      Three sessile polyps were found in the rectum, sigmoid colon and       transverse colon. The polyps were 2 to 3 mm in size. These polyps were       removed with a jumbo cold forceps. Resection and retrieval were complete.      A 5 mm polyp was found in the transverse colon. The polyp was       semi-pedunculated. The polyp was removed with a hot snare. Resection and       retrieval were complete.      A 10 mm polyp was found in the cecum. The polyp was sessile. The polyp       was removed with a saline injection-lift technique using a hot snare at       20 watts. Resection and retrieval were complete using a suction (via the       working channel). Two hemostatic clips were successfully placed.      Non-bleeding internal hemorrhoids were found during retroflexion. The       hemorrhoids were Grade I (internal hemorrhoids that do not prolapse).      The exam was otherwise without abnormality. Impression:           - Three 2 to 3 mm polyps in the rectum, in the sigmoid                        colon and in the transverse colon, removed with a jumbo                        cold forceps. Resected and retrieved.                       - One 5 mm polyp in the transverse colon, removed with                        a hot snare. Resected and retrieved.                       - One 10 mm polyp in the cecum, removed using                        injection-lift and a hot snare. Resected and retrieved.                        Clips were placed.                       - Non-bleeding internal hemorrhoids.                       - The examination was otherwise normal. Recommendation:       - Patient has a contact  number available for                        emergencies. The signs and symptoms of potential                        delayed complications were discussed with the patient.                        Return to normal  activities tomorrow. Written discharge                        instructions were provided to the patient.                       - Resume previous diet.                       - Continue present medications.                       - Await pathology results.                       - Repeat colonoscopy is recommended for surveillance                        after piecemeal polypectomy. The colonoscopy date will                        be determined after pathology results from today's exam                        become available for review.                       - Return to GI office PRN. Procedure Code(s):    --- Professional ---                       [phone], Colonoscopy, flexible; with removal of tumor(s),                        polyp(s), or other lesion(s) by snare technique                       45380, 59, Colonoscopy, flexible; with biopsy, single                        or multiple                       45381, Colonoscopy, flexible; with directed submucosal                        injection(s), any substance Diagnosis Code(s):    --- Professional ---                       K64.0, First degree hemorrhoids                       D12.3, Benign neoplasm of transverse colon (hepatic  flexure or splenic flexure)                       D12.0, Benign neoplasm of cecum                       D12.5, Benign neoplasm of sigmoid colon                       K62.1, Rectal polyp                       Z12.11, Encounter for screening for malignant neoplasm                        of colon CPT copyright 2016 American Medical Association. All rights reserved. The codes documented in this report are preliminary and upon coder review may  be revised to meet current compliance requirements. Efrain Sella MD, MD 06/23/2017 8:45:50 AM This report has been signed electronically. Number of Addenda: 0 Note Initiated On: 06/23/2017 7:30 AM Scope Withdrawal Time: 0 hours 12 minutes 49 seconds  Total  Procedure Duration: 0 hours 28 minutes 11 seconds       [facility]

## 2017-06-23 NOTE — Transfer of Care (Signed)
Immediate Anesthesia Transfer of Care Note  Patient: Kelly Doyle  Procedure(s) Performed: COLONOSCOPY WITH PROPOFOL (N/A )  Patient Location: PACU  Anesthesia Type:General  Level of Consciousness: sedated  Airway & Oxygen Therapy: Patient Spontanous Breathing  Post-op Assessment: Report given to RN and Post -op Vital signs reviewed and stable  Post vital signs: Reviewed and stable  Last Vitals:  Vitals:   06/23/17 0845 06/23/17 0846  BP:  106/74  Pulse: 96 96  Resp: 12 13  Temp: (!) 36.1 C (!) 36.1 C  SpO2: 98% 97%    Last Pain:  Vitals:   06/23/17 0846  TempSrc: Tympanic      Patients Stated Pain Goal: 6 (83/43/[id number])  Complications: No apparent anesthesia complications

## 2017-06-23 NOTE — Anesthesia Preprocedure Evaluation (Signed)
Anesthesia Evaluation  Patient identified by MRN, date of birth, ID band Patient awake    Reviewed: Allergy & Precautions, H&P , NPO status , Patient's Chart, lab work & pertinent test results  History of Anesthesia Complications Negative for: history of anesthetic complications  Airway Mallampati: III  TM Distance: <3 FB Neck ROM: limited    Dental  (+) Chipped, Poor Dentition, Missing   Pulmonary neg pulmonary ROS, neg shortness of breath,           Cardiovascular Exercise Tolerance: Good hypertension, (-) angina(-) Past MI and (-) DOE      Neuro/Psych PSYCHIATRIC DISORDERS Anxiety Depression negative neurological ROS     GI/Hepatic Neg liver ROS, GERD  Medicated and Controlled,  Endo/Other  diabetes, Type 2, Insulin Dependent  Renal/GU Renal disease  negative genitourinary   Musculoskeletal  (+) Arthritis ,   Abdominal   Peds  Hematology negative hematology ROS (+)   Anesthesia Other Findings Past Medical History: No date: Anxiety No date: Arthritis No date: Diabetes mellitus without complication (HCC) No date: GERD (gastroesophageal reflux disease) No date: Hypertension  Past Surgical History: 04/20/1973: DILATION AND CURETTAGE OF UTERUS No date: NO PAST SURGERIES No date: TUBAL LIGATION  BMI    Body Mass Index:  32.01 kg/m      Reproductive/Obstetrics negative OB ROS                             Anesthesia Physical Anesthesia Plan  ASA: III  Anesthesia Plan: General   Post-op Pain Management:    Induction: Intravenous  PONV Risk Score and Plan: Propofol infusion  Airway Management Planned: Natural Airway and Nasal Cannula  Additional Equipment:   Intra-op Plan:   Post-operative Plan:   Informed Consent: I have reviewed the patients History and Physical, chart, labs and discussed the procedure including the risks, benefits and alternatives for the proposed  anesthesia with the patient or authorized representative who has indicated his/her understanding and acceptance.   Dental Advisory Given  Plan Discussed with: Anesthesiologist, CRNA and Surgeon  Anesthesia Plan Comments: (Patient consented for risks of anesthesia including but not limited to:  - adverse reactions to medications - risk of intubation if required - damage to teeth, lips or other oral mucosa - sore throat or hoarseness - Damage to heart, brain, lungs or loss of life  Patient voiced understanding.)        Anesthesia Quick Evaluation

## 2017-06-23 NOTE — Anesthesia Procedure Notes (Signed)
Date/Time: 06/23/2017 8:07 AM Performed by: Johnna Acosta, CRNA Pre-anesthesia Checklist: Patient identified, Emergency Drugs available, Suction available, Patient being monitored and Timeout performed Patient Re-evaluated:Patient Re-evaluated prior to induction Oxygen Delivery Method: Nasal cannula Preoxygenation: Pre-oxygenation with 100% oxygen

## 2017-06-23 NOTE — Anesthesia Postprocedure Evaluation (Signed)
Anesthesia Post Note  Patient: Kelly Doyle  Procedure(s) Performed: COLONOSCOPY WITH PROPOFOL (N/A )  Patient location during evaluation: Endoscopy Anesthesia Type: General Level of consciousness: awake and alert Pain management: pain level controlled Vital Signs Assessment: post-procedure vital signs reviewed and stable Respiratory status: spontaneous breathing, nonlabored ventilation, respiratory function stable and patient connected to nasal cannula oxygen Cardiovascular status: blood pressure returned to baseline and stable Postop Assessment: no apparent nausea or vomiting Anesthetic complications: no     Last Vitals:  Vitals:   06/23/17 0903 06/23/17 0907  BP:  139/86  Pulse: 90 85  Resp: 13 13  Temp:    SpO2: 100% 100%    Last Pain:  Vitals:   06/23/17 0846  TempSrc: Tympanic                 Precious Haws Mykah Bellomo

## 2017-06-23 NOTE — Interval H&P Note (Signed)
History and Physical Interval Note:  06/23/2017 8:09 AM  Kelly Doyle  has presented today for surgery, with the diagnosis of SCREENING  The various methods of treatment have been discussed with the patient and family. After consideration of risks, benefits and other options for treatment, the patient has consented to  Procedure(s): COLONOSCOPY WITH PROPOFOL (N/A) as a surgical intervention .  The patient's history has been reviewed, patient examined, no change in status, stable for surgery.  I have reviewed the patient's chart and labs.  Questions were answered to the patient's satisfaction.     [place], [place]

## 2017-06-23 NOTE — Anesthesia Post-op Follow-up Note (Signed)
Anesthesia QCDR form completed.        

## 2017-06-24 ENCOUNTER — Encounter: Payer: Self-pay | Admitting: Internal Medicine

## 2017-06-24 LAB — SURGICAL PATHOLOGY

## 2017-08-05 ENCOUNTER — Telehealth: Payer: Self-pay | Admitting: *Deleted

## 2017-08-05 NOTE — Telephone Encounter (Signed)
error 

## 2017-09-08 ENCOUNTER — Emergency Department
Admission: EM | Admit: 2017-09-08 | Discharge: 2017-09-08 | Disposition: A | Discharge status: Home / Self Care | Payer: Medicare Other | Attending: Emergency Medicine | Admitting: Emergency Medicine

## 2017-09-08 ENCOUNTER — Encounter: Payer: Self-pay | Admitting: Emergency Medicine

## 2017-09-08 ENCOUNTER — Other Ambulatory Visit: Payer: Self-pay

## 2017-09-08 ENCOUNTER — Emergency Department: Payer: Medicare Other

## 2017-09-08 DIAGNOSIS — R1032 Left lower quadrant pain: Secondary | ICD-10-CM | POA: Insufficient documentation

## 2017-09-08 DIAGNOSIS — Z79899 Other long term (current) drug therapy: Secondary | ICD-10-CM | POA: Insufficient documentation

## 2017-09-08 DIAGNOSIS — K5900 Constipation, unspecified: Secondary | ICD-10-CM | POA: Insufficient documentation

## 2017-09-08 DIAGNOSIS — E1122 Type 2 diabetes mellitus with diabetic chronic kidney disease: Secondary | ICD-10-CM | POA: Insufficient documentation

## 2017-09-08 DIAGNOSIS — M545 Low back pain, unspecified: Secondary | ICD-10-CM

## 2017-09-08 DIAGNOSIS — I129 Hypertensive chronic kidney disease with stage 1 through stage 4 chronic kidney disease, or unspecified chronic kidney disease: Secondary | ICD-10-CM | POA: Diagnosis not present

## 2017-09-08 DIAGNOSIS — Z794 Long term (current) use of insulin: Secondary | ICD-10-CM | POA: Diagnosis not present

## 2017-09-08 DIAGNOSIS — N183 Chronic kidney disease, stage 3 (moderate): Secondary | ICD-10-CM | POA: Diagnosis not present

## 2017-09-08 LAB — URINALYSIS, COMPLETE (UACMP) WITH MICROSCOPIC
BACTERIA UA: NONE SEEN
BILIRUBIN URINE: NEGATIVE
Glucose, UA: >=500 mg/dL — AB
Hgb urine dipstick: NEGATIVE
Ketones, ur: NEGATIVE mg/dL
Nitrite: NEGATIVE
PROTEIN: NEGATIVE mg/dL
SPECIFIC GRAVITY, URINE: 1.011 (ref 1.005–1.030)
pH: 6 (ref 5.0–8.0)

## 2017-09-08 MED ORDER — POLYETHYLENE GLYCOL 3350 17 G PO PACK
17.0000 g | PACK | Freq: Every day | ORAL | Status: DC
  Administered 2017-09-08: 17 g via ORAL
  Filled 2017-09-08: qty 1

## 2017-09-08 MED ORDER — CYCLOBENZAPRINE HCL 10 MG PO TABS: 10.0000 mg | ORAL_TABLET | Freq: Three times a day (TID) | ORAL | 0 refills | Status: DC | PRN

## 2017-09-08 MED ORDER — CYCLOBENZAPRINE HCL 10 MG PO TABS
10.0000 mg | ORAL_TABLET | Freq: Once | ORAL | Status: AC
  Administered 2017-09-08: 10 mg via ORAL
  Filled 2017-09-08: qty 1

## 2017-09-08 NOTE — ED Triage Notes (Signed)
Patient ambulatory to triage with steady gait, without difficulty or distress noted; pt reports left lower back pain since yesterday, nonradiating with no accomp symptoms; denies any known injury; denies hx of same

## 2017-09-10 NOTE — ED Provider Notes (Signed)
[facility] Emergency Department Provider Note   First MD Initiated Contact with Patient 09/08/17 [phone]     (approximate)  I have reviewed the triage vital signs and the nursing notes.   HISTORY  Chief Complaint Back Pain    HPI MARCIANNA DAILY is a 62 y.o. female presents to the emergency department with atraumatic nonradiating left flank pain which began yesterday.  Patient denies any urinary symptoms.  No hematuria dysuria frequency or urgency.  Patient denies any nausea vomiting.  Patient does admit to constipation x5 days.  Patient denies any fever.  Patient denies any aggravating or alleviating factors   Past Medical History:  Diagnosis Date  . Anxiety   . Arthritis   . Diabetes mellitus without complication ([place])   . GERD (gastroesophageal reflux disease)   . Hypertension     Patient Active Problem List   Diagnosis Date Noted  . Insomnia 02/13/2016  . CKD (chronic kidney disease) stage 3, GFR 30-59 ml/min (HCC) 02/13/2016  . Hyperlipidemia 10/03/2015  . Iron deficiency anemia 10/03/2015  . Major depressive disorder, single episode, severe without psychotic features ([place]) 10/03/2014  . 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 Diabetes ([place]) 10/03/2014  . Hypertension 10/03/2014    Past Surgical History:  Procedure Laterality Date  . COLONOSCOPY WITH PROPOFOL N/A 06/23/2017   Procedure: COLONOSCOPY WITH PROPOFOL;  Surgeon: [place], Benay Pike, MD;  Location: [facility] ENDOSCOPY;  Service: Gastroenterology;  Laterality: N/A;  . DILATION AND CURETTAGE OF UTERUS  04/20/1973  . NO PAST SURGERIES    . TUBAL LIGATION      Prior to Admission medications   Medication Sig Start Date End Date Taking? Authorizing Provider  amLODipine (NORVASC) 5 MG tablet TAKE ONE TABLET BY MOUTH EVERY DAY 09/14/16   Mc[place], Larene Beach A, PA-C  atorvastatin (LIPITOR) 40 MG tablet Take 1 tablet (40 mg total) by mouth daily. 07/16/16   Doles-Johnson, Teah, NP  cephALEXin  (KEFLEX) 500 MG capsule Take 1 capsule (500 mg total) by mouth 4 (four) times daily. Patient not taking: Reported on 06/23/2017 08/30/16   Johnn Hai, PA-C  cetirizine (ZYRTEC ALLERGY) 10 MG tablet Take 1 tablet (10 mg total) by mouth daily. 09/13/16   Duanne Guess, PA-C  cyclobenzaprine (FLEXERIL) 10 MG tablet Take 1 tablet (10 mg total) by mouth 3 (three) times daily as needed. Patient not taking: Reported on 06/23/2017 09/29/16   Sable Feil, PA-C  cyclobenzaprine (FLEXERIL) 10 MG tablet Take 1 tablet (10 mg total) by mouth 3 (three) times daily as needed. 09/08/17   Gregor Hams, MD  fluticasone (FLONASE) 50 MCG/ACT nasal spray Place 2 sprays into both nostrils daily. 09/13/16 09/13/17  Duanne Guess, PA-C  glipiZIDE (GLUCOTROL) 10 MG tablet Take 1 tablet (10 mg total) by mouth 2 (two) times daily before a meal. 07/16/16   Doles-Johnson, Teah, NP  hydrochlorothiazide (HYDRODIURIL) 25 MG tablet Take 1 tablet (25 mg total) by mouth daily. 07/16/16   Doles-Johnson, Teah, NP  HYDROcodone-acetaminophen (NORCO/VICODIN) 5-325 MG tablet Take 1 tablet by mouth every 4 (four) hours as needed for moderate pain. 08/30/16   Johnn Hai, PA-C  insulin detemir (LEVEMIR) 100 UNIT/ML injection Inject 0.3 mLs (30 Units total) into the skin daily. Each day that the morning blood sugar is greater than 200, add one unit to the evening insulin detemir  dose 07/16/16   Doles-Johnson, Teah, NP  lisinopril (PRINIVIL,ZESTRIL) 20 MG tablet TAKE ONE TABLET BY MOUTH EVERY DAY  09/14/16   Zara Council A, PA-C  naproxen (NAPROSYN) 500 MG tablet Take 1 tablet (500 mg total) by mouth 2 (two) times daily with a meal. 09/29/16   Sable Feil, PA-C  OMEPRAZOLE PO Take by mouth.    [provider]  traMADol (ULTRAM) 50 MG tablet Take 1 tablet (50 mg total) by mouth every 6 (six) hours as needed. 09/29/16 09/29/17  Sable Feil, PA-C    Allergies No known drug allergies  Family History  Problem  Relation Age of Onset  . Alzheimer's disease Mother   . Hypertension Mother   . Diabetes Sister   . Gout Brother     Social History Social History   Tobacco Use  . Smoking status: Never Smoker  . Smokeless tobacco: Never Used  Substance Use Topics  . Alcohol use: No  . Drug use: No    Review of Systems Constitutional: No fever/chills Eyes: No visual changes. ENT: No sore throat. Cardiovascular: Denies chest pain. Respiratory: Denies shortness of breath. Gastrointestinal: No abdominal pain.  No nausea, no vomiting.  No diarrhea.  No constipation. Genitourinary: Negative for dysuria. Musculoskeletal: Negative for neck pain.  Positive for back pain. Integumentary: Negative for rash. Neurological: Negative for headaches, focal weakness or numbness.  ____________________________________________   PHYSICAL EXAM:  VITAL SIGNS: ED Triage Vitals  Enc Vitals Group     BP 09/08/17 0454 (!) 168/87     Pulse Rate 09/08/17 0454 92     Resp 09/08/17 0454 18     Temp 09/08/17 0454 (!) 97.5 F (36.4 C)     Temp Source 09/08/17 0454 Oral     SpO2 09/08/17 0454 96 %     Weight 09/08/17 0449 76.7 kg (169 lb)     Height 09/08/17 0449 1.575 m (5\' 2" )     Head Circumference --      Peak Flow --      Pain Score 09/08/17 0449 6     Pain Loc --      Pain Edu? --      Excl. in [place]? --     Constitutional: Alert and oriented. Well appearing and in no acute distress. Eyes: Conjunctivae are normal.  Head: Atraumatic. Mouth/Throat: Mucous membranes are moist.  Oropharynx non-erythematous. Neck: No stridor.   Cardiovascular: Normal rate, regular rhythm. Good peripheral circulation. Grossly normal heart sounds. Respiratory: Normal respiratory effort.  No retractions. Lungs CTAB. Gastrointestinal: Soft and nontender. No distention.  Musculoskeletal: No lower extremity tenderness nor edema. No gross deformities of extremities. Neurologic:  Normal speech and language. No gross focal  neurologic deficits are appreciated.  Skin:  Skin is warm, dry and intact. No rash noted. Psychiatric: Mood and affect are normal. Speech and behavior are normal.  ____________________________________________   LABS (all labs ordered are listed, but only abnormal results are displayed)  Labs Reviewed  URINALYSIS, COMPLETE (UACMP) WITH MICROSCOPIC - Abnormal; Notable for the following components:      Result Value   Color, Urine STRAW (*)    APPearance CLEAR (*)    Glucose, UA >=500 (*)    Leukocytes, UA SMALL (*)    All other components within normal limits         Procedures   ____________________________________________   INITIAL IMPRESSION / ASSESSMENT AND PLAN / ED COURSE  As part of my medical decision making, I reviewed the following data within the electronic MEDICAL RECORD NUMBER  62 year old female presented with above-stated history and physical exam with concern  for possible kidney stone versus musculoskeletal etiology CT scan performed which revealed no evidence of nephrolithiasis.  Also consider possibly pyelonephritis however urinalysis unremarkable ____________________________________________  FINAL CLINICAL IMPRESSION(S) / ED DIAGNOSES  Final diagnoses:  Acute left-sided low back pain without sciatica  Constipation, unspecified constipation type     MEDICATIONS GIVEN DURING THIS VISIT:  Medications  cyclobenzaprine (FLEXERIL) tablet 10 mg (10 mg Oral Given 09/08/17 0808)     ED Discharge Orders        Ordered    cyclobenzaprine (FLEXERIL) 10 MG tablet  3 times daily PRN     09/08/17 0743       Note:  This document was prepared using Dragon voice recognition software and may include unintentional dictation errors.    Gregor Hams, MD 09/10/17 [phone]

## 2018-05-25 ENCOUNTER — Other Ambulatory Visit: Payer: Self-pay | Admitting: Internal Medicine

## 2018-05-25 DIAGNOSIS — Z1231 Encounter for screening mammogram for malignant neoplasm of breast: Secondary | ICD-10-CM

## 2019-02-17 ENCOUNTER — Other Ambulatory Visit: Payer: Self-pay | Admitting: Physician Assistant

## 2019-02-17 DIAGNOSIS — Z1231 Encounter for screening mammogram for malignant neoplasm of breast: Secondary | ICD-10-CM

## 2019-03-08 ENCOUNTER — Other Ambulatory Visit: Payer: Self-pay | Admitting: Ophthalmology

## 2019-08-24 ENCOUNTER — Emergency Department
Admission: EM | Admit: 2019-08-24 | Discharge: 2019-08-24 | Disposition: A | Discharge status: Home / Self Care | Payer: Medicare HMO | Attending: Emergency Medicine | Admitting: Emergency Medicine

## 2019-08-24 ENCOUNTER — Other Ambulatory Visit: Payer: Self-pay

## 2019-08-24 ENCOUNTER — Encounter: Payer: Self-pay | Admitting: Emergency Medicine

## 2019-08-24 DIAGNOSIS — Z794 Long term (current) use of insulin: Secondary | ICD-10-CM | POA: Diagnosis not present

## 2019-08-24 DIAGNOSIS — R55 Syncope and collapse: Secondary | ICD-10-CM | POA: Diagnosis present

## 2019-08-24 DIAGNOSIS — U071 COVID-19: Secondary | ICD-10-CM | POA: Insufficient documentation

## 2019-08-24 DIAGNOSIS — A084 Viral intestinal infection, unspecified: Secondary | ICD-10-CM

## 2019-08-24 DIAGNOSIS — E1122 Type 2 diabetes mellitus with diabetic chronic kidney disease: Secondary | ICD-10-CM | POA: Diagnosis not present

## 2019-08-24 DIAGNOSIS — N183 Chronic kidney disease, stage 3 unspecified: Secondary | ICD-10-CM | POA: Diagnosis not present

## 2019-08-24 DIAGNOSIS — I129 Hypertensive chronic kidney disease with stage 1 through stage 4 chronic kidney disease, or unspecified chronic kidney disease: Secondary | ICD-10-CM | POA: Insufficient documentation

## 2019-08-24 DIAGNOSIS — E860 Dehydration: Secondary | ICD-10-CM | POA: Insufficient documentation

## 2019-08-24 DIAGNOSIS — Z79899 Other long term (current) drug therapy: Secondary | ICD-10-CM | POA: Insufficient documentation

## 2019-08-24 LAB — CBC
HCT: 35.6 % — ABNORMAL LOW (ref 36.0–46.0)
Hemoglobin: 11.3 g/dL — ABNORMAL LOW (ref 12.0–15.0)
MCH: 28.7 pg (ref 26.0–34.0)
MCHC: 31.7 g/dL (ref 30.0–36.0)
MCV: 90.4 fL (ref 80.0–100.0)
Platelets: 175 10*3/uL (ref 150–400)
RBC: 3.94 MIL/uL (ref 3.87–5.11)
RDW: 12.1 % (ref 11.5–15.5)
WBC: 8.8 10*3/uL (ref 4.0–10.5)
nRBC: 0 % (ref 0.0–0.2)

## 2019-08-24 LAB — URINALYSIS, COMPLETE (UACMP) WITH MICROSCOPIC
Bacteria, UA: NONE SEEN
Bilirubin Urine: NEGATIVE
Glucose, UA: >=500 mg/dL — AB
Hgb urine dipstick: NEGATIVE
Ketones, ur: 5 mg/dL — AB
Leukocytes,Ua: NEGATIVE
Nitrite: NEGATIVE
Protein, ur: 30 mg/dL — AB
Specific Gravity, Urine: 1.028 (ref 1.005–1.030)
pH: 6 (ref 5.0–8.0)

## 2019-08-24 LAB — COMPREHENSIVE METABOLIC PANEL
ALT: 19 U/L (ref 0–44)
AST: 23 U/L (ref 15–41)
Albumin: 3.7 g/dL (ref 3.5–5.0)
Alkaline Phosphatase: 54 U/L (ref 38–126)
Anion gap: 10 (ref 5–15)
BUN: 25 mg/dL — ABNORMAL HIGH (ref 8–23)
CO2: 21 mmol/L — ABNORMAL LOW (ref 22–32)
Calcium: 8.8 mg/dL — ABNORMAL LOW (ref 8.9–10.3)
Chloride: 107 mmol/L (ref 98–111)
Creatinine, Ser: 1.17 mg/dL — ABNORMAL HIGH (ref 0.44–1.00)
GFR calc Af Amer: 57 mL/min — ABNORMAL LOW (ref >60)
GFR calc non Af Amer: 49 mL/min — ABNORMAL LOW (ref >60)
Glucose, Bld: 174 mg/dL — ABNORMAL HIGH (ref 70–99)
Potassium: 3.5 mmol/L (ref 3.5–5.1)
Sodium: 138 mmol/L (ref 135–145)
Total Bilirubin: 0.6 mg/dL (ref 0.3–1.2)
Total Protein: 7.2 g/dL (ref 6.5–8.1)

## 2019-08-24 LAB — POC SARS CORONAVIRUS 2 AG: SARS Coronavirus 2 Ag: POSITIVE — AB

## 2019-08-24 LAB — GLUCOSE, CAPILLARY: Glucose-Capillary: 150 mg/dL — ABNORMAL HIGH (ref 70–99)

## 2019-08-24 MED ORDER — FAMOTIDINE 20 MG PO TABS
20.0000 mg | ORAL_TABLET | Freq: Two times a day (BID) | ORAL | 0 refills | Status: DC
Start: 2019-08-24 — End: 2019-12-15

## 2019-08-24 MED ORDER — ONDANSETRON HCL 4 MG/2ML IJ SOLN
4.0000 mg | Freq: Once | INTRAMUSCULAR | Status: AC
  Administered 2019-08-24: 4 mg via INTRAVENOUS
  Filled 2019-08-24: qty 2

## 2019-08-24 MED ORDER — LOPERAMIDE HCL 2 MG PO TABS
4.0000 mg | ORAL_TABLET | Freq: Four times a day (QID) | ORAL | 0 refills | Status: DC | PRN
Start: 2019-08-24 — End: 2021-04-29

## 2019-08-24 MED ORDER — ONDANSETRON 4 MG PO TBDP
4.0000 mg | ORAL_TABLET | Freq: Three times a day (TID) | ORAL | 0 refills | Status: DC | PRN
Start: 2019-08-24 — End: 2020-01-03

## 2019-08-24 MED ORDER — SODIUM CHLORIDE 0.9 % IV BOLUS
1000.0000 mL | Freq: Once | INTRAVENOUS | Status: AC
  Administered 2019-08-24: 19:00:00 1000 mL via INTRAVENOUS

## 2019-08-24 MED ORDER — SODIUM CHLORIDE 0.9% FLUSH
3.0000 mL | Freq: Once | INTRAVENOUS | Status: AC
  Administered 2019-08-24: 3 mL via INTRAVENOUS

## 2019-08-24 NOTE — Discharge Instructions (Signed)
Follow a bland diet and take medications as needed to control symptoms to maintain hydration while your Covid infection runs its course.  Return to the emergency room if you develop high fever, inability to eat or drink, or concerning shortness of breath.  You should quarantine for 2 weeks to avoid spreading illness to others.  You can take ibuprofen and Tylenol to control pain and fever.

## 2019-08-24 NOTE — ED Provider Notes (Signed)
[facility] Emergency Department Provider Note  ____________________________________________  Time seen: Approximately 7:35 PM  I have reviewed the triage vital signs and the nursing notes.   HISTORY  Chief Complaint Loss of Consciousness    HPI Kelly Doyle is a 64 y.o. female with a history of diabetes hypertension and GERD who comes ED complaining of syncope at the grocery.  She reports that for the past 3 weeks she has had decreased oral intake, diarrhea, generalized body aches, fatigue, lightheadedness with standing.  She had gone to the store in pursuit of Covid testing, feeling lightheaded with walking around until she passed out.  Denies any preceding chest pain shortness of breath palpitations abdominal pain or back pain.  No thunderclap headache or vision changes.  At rest she is feeling okay.  Denies any sick contacts.  No Covid vaccines yet.      Past Medical History:  Diagnosis Date  . Anxiety   . Arthritis   . Diabetes mellitus without complication ([place])   . GERD (gastroesophageal reflux disease)   . Hypertension      Patient Active Problem List   Diagnosis Date Noted  . Insomnia 02/13/2016  . CKD (chronic kidney disease) stage 3, GFR 30-59 ml/min 02/13/2016  . Hyperlipidemia 10/03/2015  . Iron deficiency anemia 10/03/2015  . Major depressive disorder, single episode, severe without psychotic features ([place]) 10/03/2014  . 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 Diabetes ([place]) 10/03/2014  . Hypertension 10/03/2014     Past Surgical History:  Procedure Laterality Date  . COLONOSCOPY WITH PROPOFOL N/A 06/23/2017   Procedure: COLONOSCOPY WITH PROPOFOL;  Surgeon: Toledo, Benay Pike, MD;  Location: [facility] ENDOSCOPY;  Service: Gastroenterology;  Laterality: N/A;  . DILATION AND CURETTAGE OF UTERUS  04/20/1973  . NO PAST SURGERIES    . TUBAL LIGATION       Prior to Admission medications   Medication Sig Start Date End Date Taking?  Authorizing Provider  amLODipine (NORVASC) 5 MG tablet TAKE ONE TABLET BY MOUTH EVERY DAY 09/14/16   McGowan, Larene Beach A, PA-C  atorvastatin (LIPITOR) 40 MG tablet Take 1 tablet (40 mg total) by mouth daily. 07/16/16   Doles-Johnson, Teah, NP  cephALEXin (KEFLEX) 500 MG capsule Take 1 capsule (500 mg total) by mouth 4 (four) times daily. Patient not taking: Reported on 06/23/2017 08/30/16   Johnn Hai, PA-C  cetirizine (ZYRTEC ALLERGY) 10 MG tablet Take 1 tablet (10 mg total) by mouth daily. 09/13/16   Duanne Guess, PA-C  cyclobenzaprine (FLEXERIL) 10 MG tablet Take 1 tablet (10 mg total) by mouth 3 (three) times daily as needed. Patient not taking: Reported on 06/23/2017 09/29/16   Sable Feil, PA-C  cyclobenzaprine (FLEXERIL) 10 MG tablet Take 1 tablet (10 mg total) by mouth 3 (three) times daily as needed. 09/08/17   Gregor Hams, MD  famotidine (PEPCID) 20 MG tablet Take 1 tablet (20 mg total) by mouth 2 (two) times daily. 08/24/19   Carrie Mew, MD  fluticasone [facility]) 50 MCG/ACT nasal spray Place 2 sprays into both nostrils daily. 09/13/16 09/13/17  Duanne Guess, PA-C  glipiZIDE (GLUCOTROL) 10 MG tablet Take 1 tablet (10 mg total) by mouth 2 (two) times daily before a meal. 07/16/16   Doles-Johnson, Teah, NP  hydrochlorothiazide (HYDRODIURIL) 25 MG tablet Take 1 tablet (25 mg total) by mouth daily. 07/16/16   Doles-Johnson, Teah, NP  HYDROcodone-acetaminophen (NORCO/VICODIN) 5-325 MG tablet Take 1 tablet by mouth every 4 (four) hours as  needed for moderate pain. 08/30/16   Johnn Hai, PA-C  insulin detemir (LEVEMIR) 100 UNIT/ML injection Inject 0.3 mLs (30 Units total) into the skin daily. Each day that the morning blood sugar is greater than 200, add one unit to the evening insulin detemir  dose 07/16/16   Doles-Johnson, Teah, NP  lisinopril (PRINIVIL,ZESTRIL) 20 MG tablet TAKE ONE TABLET BY MOUTH EVERY DAY 09/14/16   McGowan, Larene Beach A, PA-C  loperamide (IMODIUM A-D) 2 MG  tablet Take 2 tablets (4 mg total) by mouth 4 (four) times daily as needed for diarrhea or loose stools. 08/24/19   Carrie Mew, MD  OMEPRAZOLE PO Take by mouth.    [provider]  ondansetron (ZOFRAN ODT) 4 MG disintegrating tablet Take 1 tablet (4 mg total) by mouth every 8 (eight) hours as needed for nausea or vomiting. 08/24/19   Carrie Mew, MD     Allergies Patient has no known allergies.   Family History  Problem Relation Age of Onset  . Alzheimer's disease Mother   . Hypertension Mother   . Diabetes Sister   . Gout Brother     Social History Social History   Tobacco Use  . Smoking status: Never Smoker  . Smokeless tobacco: Never Used  Substance Use Topics  . Alcohol use: No  . Drug use: No    Review of Systems  Constitutional:   No fever positive chills.  ENT:   No sore throat. No rhinorrhea. Cardiovascular:   No chest pain or syncope. Respiratory:   No dyspnea or cough. Gastrointestinal:   Negative for abdominal pain and vomiting.  Positive diarrhea.  Musculoskeletal:   Negative for focal pain or swelling All other systems reviewed and are negative except as documented above in ROS and HPI.  ____________________________________________   PHYSICAL EXAM:  VITAL SIGNS: ED Triage Vitals  Enc Vitals Group     BP 08/24/19 1850 131/65     Pulse Rate 08/24/19 1850 100     Resp 08/24/19 1850 (!) 23     Temp 08/24/19 1850 98.5 F (36.9 C)     Temp Source 08/24/19 1850 Oral     SpO2 08/24/19 1850 95 %     Weight 08/24/19 1841 170 lb (77.1 kg)     Height 08/24/19 1841 5\' 2"  (1.575 m)     Head Circumference --      Peak Flow --      Pain Score 08/24/19 1841 0     Pain Loc --      Pain Edu? --      Excl. in [place]? --     Vital signs reviewed, nursing assessments reviewed.   Constitutional:   Alert and oriented. Non-toxic appearance. Eyes:   Conjunctivae are normal. EOMI. PERRL. ENT      Head:   Normocephalic and atraumatic.      Nose:    Wearing a mask.      Mouth/Throat:   Wearing a mask.      Neck:   No meningismus. Full ROM. Hematological/Lymphatic/Immunilogical:   No cervical lymphadenopathy. Cardiovascular:   RRR. Symmetric bilateral radial and DP pulses.  No murmurs. Cap refill less than 2 seconds. Respiratory:   Normal respiratory effort without tachypnea/retractions. Breath sounds are clear and equal bilaterally. No wheezes/rales/rhonchi. Gastrointestinal:   Soft without focal tenderness. Non distended. There is no CVA tenderness.  No rebound, rigidity, or guarding. Musculoskeletal:   Normal range of motion in all extremities. No joint effusions.  No lower  extremity tenderness.  No edema. Neurologic:   Normal speech and language.  Motor grossly intact. No acute focal neurologic deficits are appreciated.  Skin:    Skin is warm, dry and intact. No rash noted.  No petechiae, purpura, or bullae.  ____________________________________________    LABS (pertinent positives/negatives) (all labs ordered are listed, but only abnormal results are displayed) Labs Reviewed  GLUCOSE, CAPILLARY - Abnormal; Notable for the following components:      Result Value   Glucose-Capillary 150 (*)    All other components within normal limits  CBC - Abnormal; Notable for the following components:   Hemoglobin 11.3 (*)    HCT 35.6 (*)    All other components within normal limits  URINALYSIS, COMPLETE (UACMP) WITH MICROSCOPIC - Abnormal; Notable for the following components:   Color, Urine YELLOW (*)    APPearance CLEAR (*)    Glucose, UA >=500 (*)    Ketones, ur 5 (*)    Protein, ur 30 (*)    All other components within normal limits  COMPREHENSIVE METABOLIC PANEL - Abnormal; Notable for the following components:   CO2 21 (*)    Glucose, Bld 174 (*)    BUN 25 (*)    Creatinine, Ser 1.17 (*)    Calcium 8.8 (*)    GFR calc non Af Amer 49 (*)    GFR calc Af Amer 57 (*)    All other components within normal limits  POC SARS  CORONAVIRUS 2 AG - Abnormal; Notable for the following components:   SARS Coronavirus 2 Ag POSITIVE (*)    All other components within normal limits  CBG MONITORING, ED  POC SARS CORONAVIRUS 2 AG -  ED   ____________________________________________   EKG  Interpreted by me Sinus rhythm rate of 97, normal axis and intervals.  Normal QRS ST segments and T waves.  No ischemic changes or evidence of underlying dysrhythmia  ____________________________________________    RADIOLOGY  No results found.  ____________________________________________   PROCEDURES Procedures  ____________________________________________  DIFFERENTIAL DIAGNOSIS   Covid, influenza-like illness, dehydration, electrolyte abnormality  CLINICAL IMPRESSION / ASSESSMENT AND PLAN / ED COURSE  Medications ordered in the ED: Medications  sodium chloride flush (NS) 0.9 % injection 3 mL (3 mLs Intravenous Given 08/24/19 1921)  sodium chloride 0.9 % bolus 1,000 mL (0 mLs Intravenous Stopped 08/24/19 2108)  ondansetron (ZOFRAN) injection 4 mg (4 mg Intravenous Given 08/24/19 1921)    Pertinent labs & imaging results that were available during my care of the patient were reviewed by me and considered in my medical decision making (see chart for details).  Manessa Witbeck Tullos was evaluated in Emergency Department on 08/24/2019 for the symptoms described in the history of present illness. She was evaluated in the context of the global COVID-19 pandemic, which necessitated consideration that the patient might be at risk for infection with the SARS-CoV-2 virus that causes COVID-19. Institutional protocols and algorithms that pertain to the evaluation of patients at risk for COVID-19 are in a state of rapid change based on information released by regulatory bodies including the CDC and federal and state organizations. These policies and algorithms were followed during the patient's care in the ED.   Patient presents with multiple  symptoms suggestive of influenza-like illness and dehydration as the cause of orthostatic lightheadedness and syncope. Will perform IV fluids, Zofran, Covid testing.  ----------------------------------------- 9:13 PM on 08/24/2019 -----------------------------------------  Patient feeling better.  Tolerating oral intake.  Covid test positive, labs reassuring.  Stable for discharge home.  Counseled on quarantine, symptom control, hydration, follow-up.      ____________________________________________   FINAL CLINICAL IMPRESSION(S) / ED DIAGNOSES    Final diagnoses:  [id number] virus infection  Dehydration  Viral gastroenteritis     ED Discharge Orders         Ordered    famotidine (PEPCID) 20 MG tablet  2 times daily     08/24/19 2111    ondansetron (ZOFRAN ODT) 4 MG disintegrating tablet  Every 8 hours PRN     08/24/19 2111    loperamide (IMODIUM A-D) 2 MG tablet  4 times daily PRN     08/24/19 2111          Portions of this note were generated with dragon dictation software. Dictation errors may occur despite best attempts at proofreading.   Carrie Mew, MD 08/24/19 2114

## 2019-08-24 NOTE — ED Triage Notes (Signed)
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 Per EMS pt initially reported feeling like her blood sugar was low, CBG was 208. EMS reports pt reports a lot of stress at home.  137/92 100NSR 98% RA CBG 208

## 2019-08-25 ENCOUNTER — Telehealth: Payer: Self-pay | Admitting: Nurse Practitioner

## 2019-08-25 ENCOUNTER — Telehealth: Payer: Self-pay | Admitting: Unknown Physician Specialty

## 2019-08-25 NOTE — Telephone Encounter (Signed)
Called to discuss with patient about Covid symptoms and the use of bamlanivimab, a monoclonal antibody infusion for those with mild to moderate Covid symptoms and at a high risk of hospitalization.  Pt is qualified for this infusion at the [facility] infusion center due to Age > 2 and diabetes  Message left to call back

## 2019-08-25 NOTE — Telephone Encounter (Signed)
Called to discuss with Kelly Doyle about Covid symptoms and the use of bamlanivimab combo, a monoclonal antibody infusion for those with mild to moderate Covid symptoms and at a high risk of hospitalization.     Pt is qualified for this infusion at the [facility] infusion center due to co-morbid conditions (hypertension & diabetes) and/or a member of an at-risk group (age 65).   Unable to reach. Voicemail left.    Patient Active Problem List   Diagnosis Date Noted  . Insomnia 02/13/2016  . CKD (chronic kidney disease) stage 3, GFR 30-59 ml/min 02/13/2016  . Hyperlipidemia 10/03/2015  . Iron deficiency anemia 10/03/2015  . Major depressive disorder, single episode, severe without psychotic features ([place]) 10/03/2014  . 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 Diabetes ([place]) 10/03/2014  . Hypertension 10/03/2014    Alda Lea, AGPCNP-BC Pager: [phone] Amion: Bjorn Pippin

## 2019-10-03 ENCOUNTER — Other Ambulatory Visit: Payer: Self-pay | Admitting: Physician Assistant

## 2019-10-03 DIAGNOSIS — Z0000 Encounter for general adult medical examination without abnormal findings: Secondary | ICD-10-CM

## 2019-11-01 ENCOUNTER — Ambulatory Visit
Admission: RE | Admit: 2019-11-01 | Discharge: 2019-11-01 | Disposition: A | Discharge status: Home / Self Care | Payer: Medicare HMO | Source: Ambulatory Visit | Attending: Physician Assistant | Admitting: Physician Assistant

## 2019-11-01 DIAGNOSIS — Z1231 Encounter for screening mammogram for malignant neoplasm of breast: Secondary | ICD-10-CM | POA: Insufficient documentation

## 2019-11-06 ENCOUNTER — Other Ambulatory Visit: Payer: Self-pay

## 2019-11-06 ENCOUNTER — Ambulatory Visit (INDEPENDENT_AMBULATORY_CARE_PROVIDER_SITE_OTHER): Payer: Medicare HMO | Admitting: Cardiology

## 2019-11-06 ENCOUNTER — Encounter: Payer: Self-pay | Admitting: Cardiology

## 2019-11-06 ENCOUNTER — Ambulatory Visit (INDEPENDENT_AMBULATORY_CARE_PROVIDER_SITE_OTHER): Payer: Medicare HMO

## 2019-11-06 VITALS — BP 140/80 | HR 90 | Ht 62.0 in | Wt 159.0 lb

## 2019-11-06 DIAGNOSIS — E7800 Pure hypercholesterolemia, unspecified: Secondary | ICD-10-CM

## 2019-11-06 DIAGNOSIS — R002 Palpitations: Secondary | ICD-10-CM | POA: Diagnosis not present

## 2019-11-06 DIAGNOSIS — R0600 Dyspnea, unspecified: Secondary | ICD-10-CM

## 2019-11-06 DIAGNOSIS — R0609 Other forms of dyspnea: Secondary | ICD-10-CM

## 2019-11-06 DIAGNOSIS — I10 Essential (primary) hypertension: Secondary | ICD-10-CM

## 2019-11-06 NOTE — Progress Notes (Signed)
Cardiology Office Note:    Date:  11/06/2019   ID:  Kelly Doyle, DOB Sep 13, 1955, MRN [id number]  PCP:  [facility] [place] Cardiologist:  Kate Sable, MD  [place] Electrophysiologist:  None   Referring MD: Crissie Figures, PA-C   Chief Complaint  Patient presents with  . New Patient (Initial Visit)    Ref by Crissie Figures, PA for arrhythmia. Pt. went to her PCP with Covid 19 virus, she was found to have an abnormal EKG on her exam.     History of Present Illness:    Kelly Doyle is a 64 y.o. female with a hx of anxiety, hypertension, diabetes who presents due to irregular heartbeat/abnormal EKG. patient was diagnosed with COVID-19 2 months ago on 08/2019.  She had symptoms of generalized malaise, weakness and syncope.  She got tested and diagnosed.  She quarantine herself for 14 days.  After that incident, she got Covid vaccine a second shot was last week.  During the incident when she was diagnosed with Covid, EKG showed skipped heartbeats.  She denies any history of heart disease.  She however states having palpitations about 2 times weekly over the past couple of months.  Symptoms of fast heartbeats lasts about 10 to 15 minutes.  Also endorses shortness of breath with exertion over the past year.  Symptoms have stayed roughly the same.  Denies chest pain.  Past Medical History:  Diagnosis Date  . Anxiety   . Arthritis   . Diabetes mellitus without complication ([place])   . GERD (gastroesophageal reflux disease)   . Hypertension     Past Surgical History:  Procedure Laterality Date  . COLONOSCOPY WITH PROPOFOL N/A 06/23/2017   Procedure: COLONOSCOPY WITH PROPOFOL;  Surgeon: Tol[place], Benay Pike, MD;  Location: [facility] ENDOSCOPY;  Service: Gastroenterology;  Laterality: N/A;  . DILATION AND CURETTAGE OF UTERUS  04/20/1973  . NO PAST SURGERIES    . TUBAL LIGATION      Current Medications: Current Meds  Medication Sig  . acetaminophen  (TYLENOL) 325 MG tablet Take 650 mg by mouth every 4 (four) hours as needed.   Marland Kitchen amLODipine (NORVASC) 10 MG tablet Take 10 mg by mouth daily.  Marland Kitchen amLODipine (NORVASC) 5 MG tablet TAKE ONE TABLET BY MOUTH EVERY DAY  . atorvastatin (LIPITOR) 40 MG tablet Take 1 tablet (40 mg total) by mouth daily.  . cephALEXin (KEFLEX) 500 MG capsule Take 1 capsule (500 mg total) by mouth 4 (four) times daily.  . cetirizine (ZYRTEC ALLERGY) 10 MG tablet Take 1 tablet (10 mg total) by mouth daily.  . cyclobenzaprine (FLEXERIL) 10 MG tablet Take 1 tablet (10 mg total) by mouth 3 (three) times daily as needed.  . cyclobenzaprine (FLEXERIL) 10 MG tablet Take 1 tablet (10 mg total) by mouth 3 (three) times daily as needed.  . famotidine (PEPCID) 20 MG tablet Take 1 tablet (20 mg total) by mouth 2 (two) times daily.  Marland Kitchen FLUoxetine (PROZAC) 40 MG capsule Take 40 mg by mouth daily.  . fluticasone (FLONASE) 50 MCG/ACT nasal spray Place 2 sprays into both nostrils daily.  Marland Kitchen glipiZIDE (GLUCOTROL) 10 MG tablet Take 1 tablet (10 mg total) by mouth 2 (two) times daily before a meal.  . hydrochlorothiazide (HYDRODIURIL) 25 MG tablet Take 1 tablet (25 mg total) by mouth daily.  Marland Kitchen HYDROcodone-acetaminophen (NORCO/VICODIN) 5-325 MG tablet Take 1 tablet by mouth every 4 (four) hours as needed for moderate pain.  Marland Kitchen insulin detemir (  LEVEMIR) 100 UNIT/ML injection Inject 0.3 mLs (30 Units total) into the skin daily. Each day that the morning blood sugar is greater than 200, add one unit to the evening insulin detemir  dose  . JARDIANCE 25 MG TABS tablet 25 mg daily.   Marland Kitchen lisinopril (PRINIVIL,ZESTRIL) 20 MG tablet TAKE ONE TABLET BY MOUTH EVERY DAY  . loperamide (IMODIUM A-D) 2 MG tablet Take 2 tablets (4 mg total) by mouth 4 (four) times daily as needed for diarrhea or loose stools.  Marland Kitchen omeprazole (PRILOSEC) 20 MG capsule Take 20 mg by mouth 2 (two) times daily before a meal.   . OMEPRAZOLE PO Take by mouth.  . ondansetron (ZOFRAN ODT) 4  MG disintegrating tablet Take 1 tablet (4 mg total) by mouth every 8 (eight) hours as needed for nausea or vomiting.  Marland Kitchen VICTOZA 18 MG/3ML SOPN Inject into the skin.     Allergies:   Patient has no known allergies.   Social History   Socioeconomic History  . Marital status: Single    Spouse name: Not on file  . Number of children: Not on file  . Years of education: Not on file  . Highest education level: Not on file  Occupational History  . Not on file  Tobacco Use  . Smoking status: Never Smoker  . Smokeless tobacco: Never Used  Vaping Use  . Vaping Use: Never used  Substance and Sexual Activity  . Alcohol use: No  . Drug use: No  . Sexual activity: Yes    Birth control/protection: None  Other Topics Concern  . Not on file  Social History Narrative  . Not on file   Social Determinants of Health   Financial Resource Strain:   . Difficulty of Paying Living Expenses:   Food Insecurity:   . Worried About Charity fundraiser in the Last Year:   . Arboriculturist in the Last Year:   Transportation Needs:   . Film/video editor (Medical):   Marland Kitchen Lack of Transportation (Non-Medical):   Physical Activity:   . Days of Exercise per Week:   . Minutes of Exercise per Session:   Stress:   . Feeling of Stress :   Social Connections:   . Frequency of Communication with Friends and Family:   . Frequency of Social Gatherings with Friends and Family:   . Attends Religious Services:   . Active Member of Clubs or Organizations:   . Attends Archivist Meetings:   Marland Kitchen Marital Status:      Family History: The patient's family history includes Alzheimer's disease in her mother; Diabetes in her sister; Gout in her brother; Hypertension in her mother.  ROS:   Please see the history of present illness.     All other systems reviewed and are negative.  EKGs/Labs/Other Studies Reviewed:    The following studies were reviewed today:   EKG:  EKG is  ordered today.  The ekg  ordered today demonstrates normal sinus rhythm,  Recent Labs: 08/24/2019: ALT 19; BUN 25; Creatinine, Ser 1.17; Hemoglobin 11.3; Platelets 175; Potassium 3.5; Sodium 138  Recent Lipid Panel    Component Value Date/Time   CHOL 155 07/09/2016 1859   TRIG 121 07/09/2016 1859   HDL 58 07/09/2016 1859   CHOLHDL 2.7 07/09/2016 1859   LDLCALC 73 07/09/2016 1859    Physical Exam:    VS:  BP 140/80 (BP Location: Left Arm, Patient Position: Sitting, Cuff Size: Normal)   Pulse  90   Ht 5\' 2"  (1.575 m)   Wt 159 lb (72.1 kg)   SpO2 97%   BMI 29.08 kg/m     Wt Readings from Last 3 Encounters:  11/06/19 159 lb (72.1 kg)  08/24/19 170 lb (77.1 kg)  09/08/17 169 lb (76.7 kg)     GEN:  Well nourished, well developed in no acute distress HEENT: Normal NECK: No JVD; No carotid bruits LYMPHATICS: No lymphadenopathy CARDIAC: RRR, no murmurs, rubs, gallops RESPIRATORY:  Clear to auscultation without rales, wheezing or rhonchi  ABDOMEN: Soft, non-tender, non-distended MUSCULOSKELETAL:  No edema; No deformity  SKIN: Warm and dry NEUROLOGIC:  Alert and oriented x 3 PSYCHIATRIC:  Normal affect   ASSESSMENT:    1. Dyspnea on exertion   2. Palpitations   3. Essential hypertension   4. Pure hypercholesterolemia    PLAN:    In order of problems listed above:  1. Patient with dyspnea on exertion over the past year.  Recent COVID-19 infection.  Risk factors of hypertension, diabetes, hyperlipidemia.  Get echocardiogram to evaluate cardiac function.  Will consider stress testing at follow-up if symptoms persist after echo. 2. History of palpitations, get cardiac monitor x2 weeks to evaluate any significant arrhythmias. 3. History of hypertension, continue current BP meds. 4. History of hyperlipidemia, continue statin.  This note was generated in part or whole with voice recognition software. Voice recognition is usually quite accurate but there are transcription errors that can and very often do  occur. I apologize for any typographical errors that were not detected and corrected.  Medication Adjustments/Labs and Tests Ordered: Current medicines are reviewed at length with the patient today.  Concerns regarding medicines are outlined above.  Orders Placed This Encounter  Procedures  . LONG TERM MONITOR (3-14 DAYS)  . EKG 12-Lead  . ECHOCARDIOGRAM COMPLETE   No orders of the defined types were placed in this encounter.   Patient Instructions  Medication Instructions:   Your physician recommends that you continue on your current medications as directed. Please refer to the Current Medication list given to you today.   *If you need a refill on your cardiac medications before your next appointment, please call your pharmacy*   Lab Work: None Ordered If you have labs (blood work) drawn today and your tests are completely normal, you will receive your results only by: Marland Kitchen MyChart Message (if you have MyChart) OR . A paper copy in the mail If you have any lab test that is abnormal or we need to change your treatment, we will call you to review the results.   Testing/Procedures:  1.  Your physician has requested that you have an echocardiogram. Echocardiography is a painless test that uses sound waves to create images of your heart. It provides your doctor with information about the size and shape of your heart and how well your heart's chambers and valves are working. This procedure takes approximately one hour. There are no restrictions for this procedure.  2.  Your physician has recommended that you wear a Zio monitor. This monitor is a medical device that records the heart's electrical activity. Doctors most often use these monitors to diagnose arrhythmias. Arrhythmias are problems with the speed or rhythm of the heartbeat. The monitor is a small device applied to your chest. You can wear one while you do your normal daily activities. While wearing this monitor if you have any  symptoms to push the button and record what you felt. Once you have  worn this monitor for the period of time provider prescribed (Usually 14 days), you will return the monitor device in the postage paid box. Once it is returned they will download the data collected and provide [place] with a report which the provider will then review and we will call you with those results. Important tips:  1. Avoid showering during the first 24 hours of wearing the monitor. 2. Avoid excessive sweating to help maximize wear time. 3. Do not submerge the device, no hot tubs, and no swimming pools. 4. Keep any lotions or oils away from the patch. 5. After 24 hours you may shower with the patch on. Take brief showers with your back facing the shower head.  6. Do not remove patch once it has been placed because that will interrupt data and decrease adhesive wear time. 7. Push the button when you have any symptoms and write down what you were feeling. 8. Once you have completed wearing your monitor, remove and place into box which has postage paid and place in your outgoing mailbox.  9. If for some reason you have misplaced your box then call our office and we can provide another box and/or mail it off for you.         Follow-Up: At [facility], you and your health needs are our priority.  As part of our continuing mission to provide you with exceptional heart care, we have created designated Provider Care Teams.  These Care Teams include your primary Cardiologist (physician) and Advanced Practice Providers (APPs -  Physician Assistants and Nurse Practitioners) who all work together to provide you with the care you need, when you need it.  We recommend signing up for the patient portal called "MyChart".  Sign up information is provided on this After Visit Summary.  MyChart is used to connect with patients for Virtual Visits (Telemedicine).  Patients are able to view lab/test results, encounter notes, upcoming  appointments, etc.  Non-urgent messages can be sent to your provider as well.   To learn more about what you can do with MyChart, go to [email or web address].    Your next appointment:   5 week(s)  The format for your next appointment:   In Person  Provider:   Kate Sable, MD   Other Instructions   Echocardiogram An echocardiogram is a procedure that uses painless sound waves (ultrasound) to produce an image of the heart. Images from an echocardiogram can provide important information about:  Signs of coronary artery disease (CAD).  Aneurysm detection. An aneurysm is a weak or damaged part of an artery wall that bulges out from the normal force of blood pumping through the body.  Heart size and shape. Changes in the size or shape of the heart can be associated with certain conditions, including heart failure, aneurysm, and CAD.  Heart muscle function.  Heart valve function.  Signs of a past heart attack.  Fluid buildup around the heart.  Thickening of the heart muscle.  A tumor or infectious growth around the heart valves. Tell a health care provider about:  Any allergies you have.  All medicines you are taking, including vitamins, herbs, eye drops, creams, and over-the-counter medicines.  Any blood disorders you have.  Any surgeries you have had.  Any medical conditions you have.  Whether you are pregnant or may be pregnant. What are the risks? Generally, this is a safe procedure. However, problems may occur, including:  Allergic reaction to dye (contrast) that may be used during  the procedure. What happens before the procedure? No specific preparation is needed. You may eat and drink normally. What happens during the procedure?   An IV tube may be inserted into one of your veins.  You may receive contrast through this tube. A contrast is an injection that improves the quality of the pictures from your heart.  A gel will be applied to your  chest.  A wand-like tool (transducer) will be moved over your chest. The gel will help to transmit the sound waves from the transducer.  The sound waves will harmlessly bounce off of your heart to allow the heart images to be captured in real-time motion. The images will be recorded on a computer. The procedure may vary among health care providers and hospitals. What happens after the procedure?  You may return to your normal, everyday life, including diet, activities, and medicines, unless your health care provider tells you not to do that. Summary  An echocardiogram is a procedure that uses painless sound waves (ultrasound) to produce an image of the heart.  Images from an echocardiogram can provide important information about the size and shape of your heart, heart muscle function, heart valve function, and fluid buildup around your heart.  You do not need to do anything to prepare before this procedure. You may eat and drink normally.  After the echocardiogram is completed, you may return to your normal, everyday life, unless your health care provider tells you not to do that. This information is not intended to replace advice given to you by your health care provider. Make sure you discuss any questions you have with your health care provider. Document Revised: 07/28/2018 Document Reviewed: 05/09/2016 Elsevier Patient Education  2020 [place], Kate Sable, MD  11/06/2019 12:23 PM    [place]

## 2019-11-06 NOTE — Patient Instructions (Signed)
Medication Instructions:   Your physician recommends that you continue on your current medications as directed. Please refer to the Current Medication list given to you today.   *If you need a refill on your cardiac medications before your next appointment, please call your pharmacy*   Lab Work: None Ordered If you have labs (blood work) drawn today and your tests are completely normal, you will receive your results only by: Marland Kitchen MyChart Message (if you have MyChart) OR . A paper copy in the mail If you have any lab test that is abnormal or we need to change your treatment, we will call you to review the results.   Testing/Procedures:  1.  Your physician has requested that you have an echocardiogram. Echocardiography is a painless test that uses sound waves to create images of your heart. It provides your doctor with information about the size and shape of your heart and how well your heart's chambers and valves are working. This procedure takes approximately one hour. There are no restrictions for this procedure.  2.  Your physician has recommended that you wear a Zio monitor. This monitor is a medical device that records the heart's electrical activity. Doctors most often use these monitors to diagnose arrhythmias. Arrhythmias are problems with the speed or rhythm of the heartbeat. The monitor is a small device applied to your chest. You can wear one while you do your normal daily activities. While wearing this monitor if you have any symptoms to push the button and record what you felt. Once you have worn this monitor for the period of time provider prescribed (Usually 14 days), you will return the monitor device in the postage paid box. Once it is returned they will download the data collected and provide [place] with a report which the provider will then review and we will call you with those results. Important tips:  1. Avoid showering during the first 24 hours of wearing the monitor. 2. Avoid  excessive sweating to help maximize wear time. 3. Do not submerge the device, no hot tubs, and no swimming pools. 4. Keep any lotions or oils away from the patch. 5. After 24 hours you may shower with the patch on. Take brief showers with your back facing the shower head.  6. Do not remove patch once it has been placed because that will interrupt data and decrease adhesive wear time. 7. Push the button when you have any symptoms and write down what you were feeling. 8. Once you have completed wearing your monitor, remove and place into box which has postage paid and place in your outgoing mailbox.  9. If for some reason you have misplaced your box then call our office and we can provide another box and/or mail it off for you.         Follow-Up: At [facility], you and your health needs are our priority.  As part of our continuing mission to provide you with exceptional heart care, we have created designated Provider Care Teams.  These Care Teams include your primary Cardiologist (physician) and Advanced Practice Providers (APPs -  Physician Assistants and Nurse Practitioners) who all work together to provide you with the care you need, when you need it.  We recommend signing up for the patient portal called "MyChart".  Sign up information is provided on this After Visit Summary.  MyChart is used to connect with patients for Virtual Visits (Telemedicine).  Patients are able to view lab/test results, encounter notes, upcoming appointments,  etc.  Non-urgent messages can be sent to your provider as well.   To learn more about what you can do with MyChart, go to [email or web address].    Your next appointment:   5 week(s)  The format for your next appointment:   In Person  Provider:   Kate Sable, MD   Other Instructions   Echocardiogram An echocardiogram is a procedure that uses painless sound waves (ultrasound) to produce an image of the heart. Images from an  echocardiogram can provide important information about:  Signs of coronary artery disease (CAD).  Aneurysm detection. An aneurysm is a weak or damaged part of an artery wall that bulges out from the normal force of blood pumping through the body.  Heart size and shape. Changes in the size or shape of the heart can be associated with certain conditions, including heart failure, aneurysm, and CAD.  Heart muscle function.  Heart valve function.  Signs of a past heart attack.  Fluid buildup around the heart.  Thickening of the heart muscle.  A tumor or infectious growth around the heart valves. Tell a health care provider about:  Any allergies you have.  All medicines you are taking, including vitamins, herbs, eye drops, creams, and over-the-counter medicines.  Any blood disorders you have.  Any surgeries you have had.  Any medical conditions you have.  Whether you are pregnant or may be pregnant. What are the risks? Generally, this is a safe procedure. However, problems may occur, including:  Allergic reaction to dye (contrast) that may be used during the procedure. What happens before the procedure? No specific preparation is needed. You may eat and drink normally. What happens during the procedure?   An IV tube may be inserted into one of your veins.  You may receive contrast through this tube. A contrast is an injection that improves the quality of the pictures from your heart.  A gel will be applied to your chest.  A wand-like tool (transducer) will be moved over your chest. The gel will help to transmit the sound waves from the transducer.  The sound waves will harmlessly bounce off of your heart to allow the heart images to be captured in real-time motion. The images will be recorded on a computer. The procedure may vary among health care providers and hospitals. What happens after the procedure?  You may return to your normal, everyday life, including diet,  activities, and medicines, unless your health care provider tells you not to do that. Summary  An echocardiogram is a procedure that uses painless sound waves (ultrasound) to produce an image of the heart.  Images from an echocardiogram can provide important information about the size and shape of your heart, heart muscle function, heart valve function, and fluid buildup around your heart.  You do not need to do anything to prepare before this procedure. You may eat and drink normally.  After the echocardiogram is completed, you may return to your normal, everyday life, unless your health care provider tells you not to do that. This information is not intended to replace advice given to you by your health care provider. Make sure you discuss any questions you have with your health care provider. Document Revised: 07/28/2018 Document Reviewed: 05/09/2016 Elsevier Patient Education  [place].

## 2019-11-10 ENCOUNTER — Telehealth: Payer: Self-pay | Admitting: Cardiology

## 2019-11-10 NOTE — Telephone Encounter (Signed)
Patient calling  States that her ZIO monitor keeps falling off Patient had it placed on the 19th and if came off before but she taped it herself but it fell off last night 7/22 and will not stay Please call to discuss

## 2019-11-10 NOTE — Telephone Encounter (Signed)
Returned call to patient to give her number for zio to help trouble shoot issues with device.   Advised pt to call for any further questions or concerns.

## 2019-11-23 ENCOUNTER — Other Ambulatory Visit: Payer: Self-pay | Admitting: Cardiology

## 2019-11-23 DIAGNOSIS — R0609 Other forms of dyspnea: Secondary | ICD-10-CM

## 2019-11-27 ENCOUNTER — Telehealth: Payer: Self-pay | Admitting: *Deleted

## 2019-11-27 NOTE — Telephone Encounter (Signed)
-----   Message from Kate Sable, MD sent at 11/24/2019  6:47 PM EDT ----- Monitor worn for roughly 3 days.  Benign cardiac monitor, no significant arrhythmias.

## 2019-11-27 NOTE — Telephone Encounter (Signed)
No answer. Left message to call back.   

## 2019-11-29 NOTE — Telephone Encounter (Signed)
[place] to give Zio results. 2nd call made.

## 2019-12-01 ENCOUNTER — Other Ambulatory Visit: Payer: Self-pay

## 2019-12-01 ENCOUNTER — Ambulatory Visit (INDEPENDENT_AMBULATORY_CARE_PROVIDER_SITE_OTHER): Payer: Medicare HMO

## 2019-12-01 DIAGNOSIS — R0600 Dyspnea, unspecified: Secondary | ICD-10-CM

## 2019-12-01 DIAGNOSIS — R0609 Other forms of dyspnea: Secondary | ICD-10-CM

## 2019-12-01 LAB — ECHOCARDIOGRAM COMPLETE
AR max vel: 1.83 cm2
AV Area VTI: 1.8 cm2
AV Area mean vel: 1.7 cm2
AV Mean grad: 3 mmHg
AV Peak grad: 6 mmHg
Ao pk vel: 1.22 m/s
Area-P 1/2: 4.41 cm2
Calc EF: 45.1 %
S' Lateral: 2.9 cm
Single Plane A2C EF: 46.6 %
Single Plane A4C EF: 44.7 %

## 2019-12-01 NOTE — Telephone Encounter (Signed)
Attempted to call the patient. No answer- I left a message to please call back (no DPR on file).   Echo result from today pending.

## 2019-12-04 NOTE — Telephone Encounter (Signed)
3 attempts made to call. Closing encounter.

## 2019-12-06 ENCOUNTER — Telehealth: Payer: Self-pay

## 2019-12-06 NOTE — Telephone Encounter (Signed)
-----   Message from Kate Sable, MD sent at 12/06/2019  3:06 PM EDT ----- Normal systolic function, impaired relaxation.

## 2019-12-06 NOTE — Telephone Encounter (Signed)
Attempted to call patient. [facility] 12/06/2019

## 2019-12-07 NOTE — Telephone Encounter (Signed)
Patient is returning your call.  

## 2019-12-07 NOTE — Telephone Encounter (Signed)
Call to patient to review echo results.    Pt verbalized understanding and has no further questions at this time.    Advised pt to call for any further questions or concerns.  No further orders.  Confirmed upcoming appt next week.

## 2019-12-15 ENCOUNTER — Encounter: Payer: Self-pay | Admitting: Cardiology

## 2019-12-15 ENCOUNTER — Ambulatory Visit (INDEPENDENT_AMBULATORY_CARE_PROVIDER_SITE_OTHER): Payer: Medicare HMO | Admitting: Cardiology

## 2019-12-15 ENCOUNTER — Other Ambulatory Visit: Payer: Self-pay

## 2019-12-15 VITALS — BP 154/92 | HR 87 | Ht 62.0 in | Wt 160.8 lb

## 2019-12-15 DIAGNOSIS — I10 Essential (primary) hypertension: Secondary | ICD-10-CM | POA: Diagnosis not present

## 2019-12-15 DIAGNOSIS — E7800 Pure hypercholesterolemia, unspecified: Secondary | ICD-10-CM

## 2019-12-15 DIAGNOSIS — R002 Palpitations: Secondary | ICD-10-CM

## 2019-12-15 DIAGNOSIS — R0600 Dyspnea, unspecified: Secondary | ICD-10-CM

## 2019-12-15 DIAGNOSIS — R0609 Other forms of dyspnea: Secondary | ICD-10-CM

## 2019-12-15 NOTE — Progress Notes (Signed)
Cardiology Office Note:    Date:  12/15/2019   ID:  Kelly Doyle, DOB Oct 13, 1955, MRN [id number]  PCP:  [facility] [place] Cardiologist:  Kate Sable, MD  [place] Electrophysiologist:  None   Referring MD: [facility] [place]   Chief Complaint  Patient presents with  . Follow-up    5 Week follow up and review ZIO results. Medications verbally reviewed with patient, however not sure of what she is taking    History of Present Illness:    Kelly Doyle is a 64 y.o. female with a hx of anxiety, hypertension, diabetes who presents for follow-up.  She was last seen due to irregular heartbeats and shortness of breath.  Cardiac monitor was placed to evaluate any significant arrhythmias.  Echocardiogram ordered to evaluate cardiac dysfunction.  Patient still states having shortness of breath with exertion, denies chest pain.  Would like to know results of testing.  Diagnosed with Covid on 08/2019, had symptoms of generalized malaise and weakness.  Shortness of breath ongoing for about a year.  No new concerns at this time.   Past Medical History:  Diagnosis Date  . Anxiety   . Arthritis   . Diabetes mellitus without complication ([place])   . GERD (gastroesophageal reflux disease)   . Hypertension     Past Surgical History:  Procedure Laterality Date  . COLONOSCOPY WITH PROPOFOL N/A 06/23/2017   Procedure: COLONOSCOPY WITH PROPOFOL;  Surgeon: Toledo, Benay Pike, MD;  Location: [facility] ENDOSCOPY;  Service: Gastroenterology;  Laterality: N/A;  . DILATION AND CURETTAGE OF UTERUS  04/20/1973  . NO PAST SURGERIES    . TUBAL LIGATION      Current Medications: Current Meds  Medication Sig  . acetaminophen (TYLENOL) 650 MG CR tablet SMARTSIG:1-2 Tablet(s) By Mouth Every 8 Hours PRN  . amLODipine (NORVASC) 10 MG tablet Take 10 mg by mouth daily.  Marland Kitchen amLODipine (NORVASC) 5 MG tablet TAKE ONE TABLET BY MOUTH EVERY DAY  . atorvastatin (LIPITOR)  40 MG tablet Take 1 tablet (40 mg total) by mouth daily.  . cephALEXin (KEFLEX) 500 MG capsule Take 1 capsule (500 mg total) by mouth 4 (four) times daily.  . cetirizine (ZYRTEC ALLERGY) 10 MG tablet Take 1 tablet (10 mg total) by mouth daily.  . cyclobenzaprine (FLEXERIL) 10 MG tablet Take 1 tablet (10 mg total) by mouth 3 (three) times daily as needed.  . cyclobenzaprine (FLEXERIL) 10 MG tablet Take 1 tablet (10 mg total) by mouth 3 (three) times daily as needed.  Marland Kitchen FLUoxetine (PROZAC) 40 MG capsule Take 40 mg by mouth daily.  Marland Kitchen glipiZIDE (GLUCOTROL) 10 MG tablet Take 1 tablet (10 mg total) by mouth 2 (two) times daily before a meal.  . hydrochlorothiazide (HYDRODIURIL) 25 MG tablet Take 1 tablet (25 mg total) by mouth daily.  Marland Kitchen HYDROcodone-acetaminophen (NORCO/VICODIN) 5-325 MG tablet Take 1 tablet by mouth every 4 (four) hours as needed for moderate pain.  Marland Kitchen insulin detemir (LEVEMIR) 100 UNIT/ML injection Inject 0.3 mLs (30 Units total) into the skin daily. Each day that the morning blood sugar is greater than 200, add one unit to the evening insulin detemir  dose  . JARDIANCE 25 MG TABS tablet 25 mg daily.   Marland Kitchen lisinopril (PRINIVIL,ZESTRIL) 20 MG tablet TAKE ONE TABLET BY MOUTH EVERY DAY  . loperamide (IMODIUM A-D) 2 MG tablet Take 2 tablets (4 mg total) by mouth 4 (four) times daily as needed for diarrhea or loose stools.  Marland Kitchen  omeprazole (PRILOSEC) 20 MG capsule Take 20 mg by mouth 2 (two) times daily before a meal.   . ondansetron (ZOFRAN ODT) 4 MG disintegrating tablet Take 1 tablet (4 mg total) by mouth every 8 (eight) hours as needed for nausea or vomiting.  Marland Kitchen VICTOZA 18 MG/3ML SOPN Inject into the skin.     Allergies:   Patient has no known allergies.   Social History   Socioeconomic History  . Marital status: Single    Spouse name: Not on file  . Number of children: Not on file  . Years of education: Not on file  . Highest education level: Not on file  Occupational History  .  Not on file  Tobacco Use  . Smoking status: Never Smoker  . Smokeless tobacco: Never Used  Vaping Use  . Vaping Use: Never used  Substance and Sexual Activity  . Alcohol use: No  . Drug use: No  . Sexual activity: Yes    Birth control/protection: None  Other Topics Concern  . Not on file  Social History Narrative  . Not on file   Social Determinants of Health   Financial Resource Strain:   . Difficulty of Paying Living Expenses: Not on file  Food Insecurity:   . Worried About Charity fundraiser in the Last Year: Not on file  . Ran Out of Food in the Last Year: Not on file  Transportation Needs:   . Lack of Transportation (Medical): Not on file  . Lack of Transportation (Non-Medical): Not on file  Physical Activity:   . Days of Exercise per Week: Not on file  . Minutes of Exercise per Session: Not on file  Stress:   . Feeling of Stress : Not on file  Social Connections:   . Frequency of Communication with Friends and Family: Not on file  . Frequency of Social Gatherings with Friends and Family: Not on file  . Attends Religious Services: Not on file  . Active Member of Clubs or Organizations: Not on file  . Attends Archivist Meetings: Not on file  . Marital Status: Not on file     Family History: The patient's family history includes Alzheimer's disease in her mother; Diabetes in her sister; Gout in her brother; Hypertension in her mother.  ROS:   Please see the history of present illness.     All other systems reviewed and are negative.  EKGs/Labs/Other Studies Reviewed:    The following studies were reviewed today:   EKG:  EKG is  ordered today.  The ekg ordered today demonstrates normal sinus rhythm, normal ECG.  Recent Labs: 08/24/2019: ALT 19; BUN 25; Creatinine, Ser 1.17; Hemoglobin 11.3; Platelets 175; Potassium 3.5; Sodium 138  Recent Lipid Panel    Component Value Date/Time   CHOL 155 07/09/2016 1859   TRIG 121 07/09/2016 1859   HDL 58  07/09/2016 1859   CHOLHDL 2.7 07/09/2016 1859   LDLCALC 73 07/09/2016 1859    Physical Exam:    VS:  BP (!) 154/92 (BP Location: Left Arm, Patient Position: Sitting, Cuff Size: Normal)   Pulse 87   Ht 5\' 2"  (1.575 m)   Wt 160 lb 12.8 oz (72.9 kg)   SpO2 97%   BMI 29.41 kg/m     Wt Readings from Last 3 Encounters:  12/15/19 160 lb 12.8 oz (72.9 kg)  11/06/19 159 lb (72.1 kg)  08/24/19 170 lb (77.1 kg)     GEN:  Well nourished, well  developed in no acute distress HEENT: Normal NECK: No JVD; No carotid bruits LYMPHATICS: No lymphadenopathy CARDIAC: RRR, no murmurs, rubs, gallops RESPIRATORY:  Clear to auscultation without rales, wheezing or rhonchi  ABDOMEN: Soft, non-tender, non-distended MUSCULOSKELETAL:  No edema; No deformity  SKIN: Warm and dry NEUROLOGIC:  Alert and oriented x 3 PSYCHIATRIC:  Normal affect   ASSESSMENT:    1. Dyspnea on exertion   2. Palpitations   3. Essential hypertension   4. Pure hypercholesterolemia    PLAN:    In order of problems listed above:  1. Patient with dyspnea on exertion over the past year.  COVID-19 infection 08/2019.  Risk factors of hypertension, diabetes, hyperlipidemia.  Echocardiogram showed normal systolic function, EF 50 to 55%, impaired relaxation, mild MR. Get a Lexiscan Myoview to evaluate for ischemia.  Graduated exercising advised as patient may be deconditioned. 2. History of palpitations, cardiac monitor worn for about 3 days, no significant arrhythmias noted. 3. History of hypertension, BP elevated today, usually more controlled.  Monitor for now, continue current BP meds.  Consider increasing BP meds if elevated at follow-up visit. 4. History of hyperlipidemia, continue statin.  Follow-up after stress testing.  Total encounter time 40 minutes  Greater than 50% was spent in counseling and coordination of care with the patient   This note was generated in part or whole with voice recognition software. Voice  recognition is usually quite accurate but there are transcription errors that can and very often do occur. I apologize for any typographical errors that were not detected and corrected.  Medication Adjustments/Labs and Tests Ordered: Current medicines are reviewed at length with the patient today.  Concerns regarding medicines are outlined above.  Orders Placed This Encounter  Procedures  . NM Myocar Multi W/Spect W/Wall Motion / EF  . EKG 12-Lead   No orders of the defined types were placed in this encounter.   Patient Instructions  Medication Instructions:  Your physician recommends that you continue on your current medications as directed. Please refer to the Current Medication list given to you today.  *If you need a refill on your cardiac medications before your next appointment, please call your pharmacy*   Lab Work: None Ordered If you have labs (blood work) drawn today and your tests are completely normal, you will receive your results only by: Marland Kitchen MyChart Message (if you have MyChart) OR . A paper copy in the mail If you have any lab test that is abnormal or we need to change your treatment, we will call you to review the results.   Testing/Procedures:  Your physician has requested that you have a lexiscan myoview.   [place]  Your caregiver has ordered a Stress Test with nuclear imaging. The purpose of this test is to evaluate the blood supply to your heart muscle. This procedure is referred to as a "Non-Invasive Stress Test." This is because other than having an IV started in your vein, nothing is inserted or "invades" your body. Cardiac stress tests are done to find areas of poor blood flow to the heart by determining the extent of coronary artery disease (CAD). Some patients exercise on a treadmill, which naturally increases the blood flow to your heart, while others who are  unable to walk on a treadmill due to physical limitations have a pharmacologic/chemical stress  agent called Lexiscan . This medicine will mimic walking on a treadmill by temporarily increasing your coronary blood flow.   **Please note: these test may take  anywhere between 2-4 hours to complete    PLEASE REPORT TO [facility] MEDICAL MALL ENTRANCE  THE VOLUNTEERS AT THE FIRST DESK WILL DIRECT YOU WHERE TO GO  Date of Procedure:_____________________________________  Arrival Time for Procedure:______________________________   PLEASE NOTIFY THE OFFICE AT LEAST 24 HOURS IN ADVANCE IF YOU ARE UNABLE TO KEEP YOUR APPOINTMENT.  [phone] AND  PLEASE NOTIFY NUCLEAR MEDICINE AT [facility] AT LEAST 24 HOURS IN ADVANCE IF YOU ARE UNABLE TO KEEP YOUR APPOINTMENT. [phone]    Instructions regarding medication:   _XX__ : Hold diabetes medication morning of procedure: glipiZIDE (GLUCOTROL) 10 MG tablet, insulin detemir (LEVEMIR) 100 UNIT/ML injection, JARDIANCE 25 MG TABS tablet, VICTOZA 18 MG/3ML SOPN    How to prepare for your Myoview test:  1. Do not eat or drink after midnight 2. No caffeine for 24 hours prior to test 3. No smoking 24 hours prior to test. 4. Your medication may be taken with water.  If your doctor stopped a medication because of this test, do not take that medication. 5. Ladies, please do not wear dresses.  Skirts or pants are appropriate. Please wear a short sleeve shirt. 6. No perfume, cologne or lotion. 7. Wear comfortable walking shoes. No heels!     Follow-Up: At [facility], you and your health needs are our priority.  As part of our continuing mission to provide you with exceptional heart care, we have created designated Provider Care Teams.  These Care Teams include your primary Cardiologist (physician) and Advanced Practice Providers (APPs -  Physician Assistants and Nurse Practitioners) who all work together to provide you with the care you need, when you need it.  We recommend signing up for the patient portal called "MyChart".  Sign up information is  provided on this After Visit Summary.  MyChart is used to connect with patients for Virtual Visits (Telemedicine).  Patients are able to view lab/test results, encounter notes, upcoming appointments, etc.  Non-urgent messages can be sent to your provider as well.   To learn more about what you can do with MyChart, go to [email or web address].    Your next appointment:   Follow up after Myoview   The format for your next appointment:   In Person  Provider:   Kate Sable, MD   Other Instructions      Signed, Kate Sable, MD  12/15/2019 11:52 AM    [place]

## 2019-12-15 NOTE — Patient Instructions (Signed)
Medication Instructions:  Your physician recommends that you continue on your current medications as directed. Please refer to the Current Medication list given to you today.  *If you need a refill on your cardiac medications before your next appointment, please call your pharmacy*   Lab Work: None Ordered If you have labs (blood work) drawn today and your tests are completely normal, you will receive your results only by: Marland Kitchen MyChart Message (if you have MyChart) OR . A paper copy in the mail If you have any lab test that is abnormal or we need to change your treatment, we will call you to review the results.   Testing/Procedures:  Your physician has requested that you have a lexiscan myoview.   [place]  Your caregiver has ordered a Stress Test with nuclear imaging. The purpose of this test is to evaluate the blood supply to your heart muscle. This procedure is referred to as a "Non-Invasive Stress Test." This is because other than having an IV started in your vein, nothing is inserted or "invades" your body. Cardiac stress tests are done to find areas of poor blood flow to the heart by determining the extent of coronary artery disease (CAD). Some patients exercise on a treadmill, which naturally increases the blood flow to your heart, while others who are  unable to walk on a treadmill due to physical limitations have a pharmacologic/chemical stress agent called Lexiscan . This medicine will mimic walking on a treadmill by temporarily increasing your coronary blood flow.   **Please note: these test may take anywhere between 2-4 hours to complete    PLEASE REPORT TO [facility] MEDICAL MALL ENTRANCE  THE VOLUNTEERS AT THE FIRST DESK WILL DIRECT YOU WHERE TO GO  Date of Procedure:_____________________________________  Arrival Time for Procedure:______________________________   PLEASE NOTIFY THE OFFICE AT LEAST 24 HOURS IN ADVANCE IF YOU ARE UNABLE TO KEEP YOUR APPOINTMENT.   [phone] AND  PLEASE NOTIFY NUCLEAR MEDICINE AT [facility] AT LEAST 24 HOURS IN ADVANCE IF YOU ARE UNABLE TO KEEP YOUR APPOINTMENT. [phone]    Instructions regarding medication:   _XX__ : Hold diabetes medication morning of procedure: glipiZIDE (GLUCOTROL) 10 MG tablet, insulin detemir (LEVEMIR) 100 UNIT/ML injection, JARDIANCE 25 MG TABS tablet, VICTOZA 18 MG/3ML SOPN    How to prepare for your Myoview test:  1. Do not eat or drink after midnight 2. No caffeine for 24 hours prior to test 3. No smoking 24 hours prior to test. 4. Your medication may be taken with water.  If your doctor stopped a medication because of this test, do not take that medication. 5. Ladies, please do not wear dresses.  Skirts or pants are appropriate. Please wear a short sleeve shirt. 6. No perfume, cologne or lotion. 7. Wear comfortable walking shoes. No heels!     Follow-Up: At [facility], you and your health needs are our priority.  As part of our continuing mission to provide you with exceptional heart care, we have created designated Provider Care Teams.  These Care Teams include your primary Cardiologist (physician) and Advanced Practice Providers (APPs -  Physician Assistants and Nurse Practitioners) who all work together to provide you with the care you need, when you need it.  We recommend signing up for the patient portal called "MyChart".  Sign up information is provided on this After Visit Summary.  MyChart is used to connect with patients for Virtual Visits (Telemedicine).  Patients are able to view lab/test results, encounter notes, upcoming appointments,  etc.  Non-urgent messages can be sent to your provider as well.   To learn more about what you can do with MyChart, go to [email or web address].    Your next appointment:   Follow up after Myoview   The format for your next appointment:   In Person  Provider:   Kate Sable, MD   Other Instructions

## 2019-12-26 ENCOUNTER — Encounter
Admission: RE | Admit: 2019-12-26 | Discharge: 2019-12-26 | Disposition: A | Discharge status: Home / Self Care | Payer: Medicare HMO | Source: Ambulatory Visit | Attending: Cardiology | Admitting: Cardiology

## 2019-12-26 ENCOUNTER — Other Ambulatory Visit: Payer: Self-pay

## 2019-12-26 DIAGNOSIS — R0600 Dyspnea, unspecified: Secondary | ICD-10-CM | POA: Diagnosis not present

## 2019-12-26 DIAGNOSIS — R0609 Other forms of dyspnea: Secondary | ICD-10-CM

## 2019-12-26 MED ORDER — TECHNETIUM TC 99M TETROFOSMIN IV KIT
30.0000 | PACK | Freq: Once | INTRAVENOUS | Status: AC | PRN
  Administered 2019-12-26: 32.351 via INTRAVENOUS

## 2019-12-26 MED ORDER — TECHNETIUM TC 99M TETROFOSMIN IV KIT
10.0000 | PACK | Freq: Once | INTRAVENOUS | Status: AC | PRN
  Administered 2019-12-26: 10.638 via INTRAVENOUS

## 2019-12-26 MED ORDER — REGADENOSON 0.4 MG/5ML IV SOLN
0.4000 mg | Freq: Once | INTRAVENOUS | Status: AC
  Administered 2019-12-26: 0.4 mg via INTRAVENOUS
  Filled 2019-12-26: qty 5

## 2019-12-27 LAB — NM MYOCAR MULTI W/SPECT W/WALL MOTION / EF
Estimated workload: 1 METS
Exercise duration (min): 0 min
Exercise duration (sec): 0 s
LV dias vol: 31 mL (ref 46–106)
LV sys vol: 12 mL
MPHR: 155 {beats}/min
Peak HR: 118 {beats}/min
Percent HR: 76 %
Rest HR: 75 {beats}/min
TID: 0.71

## 2020-01-03 ENCOUNTER — Other Ambulatory Visit: Payer: Self-pay

## 2020-01-03 ENCOUNTER — Ambulatory Visit (INDEPENDENT_AMBULATORY_CARE_PROVIDER_SITE_OTHER): Payer: Medicare HMO | Admitting: Family

## 2020-01-03 ENCOUNTER — Encounter: Payer: Self-pay | Admitting: Family

## 2020-01-03 VITALS — BP 144/80 | HR 96 | Ht 62.0 in | Wt 161.6 lb

## 2020-01-03 DIAGNOSIS — E782 Mixed hyperlipidemia: Secondary | ICD-10-CM

## 2020-01-03 DIAGNOSIS — E1159 Type 2 diabetes mellitus with other circulatory complications: Secondary | ICD-10-CM

## 2020-01-03 DIAGNOSIS — R002 Palpitations: Secondary | ICD-10-CM | POA: Diagnosis not present

## 2020-01-03 DIAGNOSIS — R0600 Dyspnea, unspecified: Secondary | ICD-10-CM

## 2020-01-03 DIAGNOSIS — R0609 Other forms of dyspnea: Secondary | ICD-10-CM

## 2020-01-03 DIAGNOSIS — I10 Essential (primary) hypertension: Secondary | ICD-10-CM

## 2020-01-03 MED ORDER — LISINOPRIL 30 MG PO TABS: 30.0000 mg | ORAL_TABLET | Freq: Every day | ORAL | 1 refills | Status: DC

## 2020-01-03 NOTE — Patient Instructions (Addendum)
Medication Instructions:  Your physician has recommended you make the following change in your medication:   CHANGE Lisinopril to 30mg  once daily  Recommend over the counter Cetirizine (Zyrtec) or Loratidine (Claritin) once per day. This will hopefully help reduce your phlegm in your throat.   *If you need a refill on your cardiac medications before your next appointment, please call your pharmacy*   Lab Work: Your physician recommends that you return for lab work in: 2 weeks at [employer] for the [facility]. You do not need to be fasting.   If you have labs (blood work) drawn today and your tests are completely normal, you will receive your results only by: Marland Kitchen MyChart Message (if you have MyChart) OR . A paper copy in the mail If you have any lab test that is abnormal or we need to change your treatment, we will call you to review the results.  Testing/Procedures: Your stress test looked great! No evidence of blockages or stress to your heart.  Follow-Up: At [facility], you and your health needs are our priority.  As part of our continuing mission to provide you with exceptional heart care, we have created designated Provider Care Teams.  These Care Teams include your primary Cardiologist (physician) and Advanced Practice Providers (APPs -  Physician Assistants and Nurse Practitioners) who all work together to provide you with the care you need, when you need it.  We recommend signing up for the patient portal called "MyChart".  Sign up information is provided on this After Visit Summary.  MyChart is used to connect with patients for Virtual Visits (Telemedicine).  Patients are able to view lab/test results, encounter notes, upcoming appointments, etc.  Non-urgent messages can be sent to your provider as well.   To learn more about what you can do with MyChart, go to [email or web address].    Your next appointment:   6 month(s)  The format for your next appointment:   In  Person  Provider:    You may see Kate Sable, MD or one of the following Advanced Practice Providers on your designated Care Team:    Murray Hodgkins, NP  Christell Faith, PA-C  Ritter Helsley, NP  Marrianne Mood, PA-C  Other Instructions   Palpitations Palpitations are feelings that your heartbeat is not normal. Your heartbeat may feel like it is:  Uneven.  Faster than normal.  Fluttering.  Skipping a beat. This is usually not a serious problem. In some cases, you may need tests to rule out any serious problems. Follow these instructions at home: Pay attention to any changes in your condition. Take these actions to help manage your symptoms: Eating and drinking  Avoid: ? Coffee, tea, soft drinks, and energy drinks. ? Chocolate. ? Alcohol. ? Diet pills. Lifestyle   Try to lower your stress. These things can help you relax: ? Yoga. ? Deep breathing and meditation. ? Exercise. ? Using words and images to create positive thoughts (guided imagery). ? Using your mind to control things in your body (biofeedback).  Do not use drugs.  Get plenty of rest and sleep. Keep a regular bed time. General instructions   Take over-the-counter and prescription medicines only as told by your doctor.  Do not use any products that contain nicotine or tobacco, such as cigarettes and e-cigarettes. If you need help quitting, ask your doctor.  Keep all follow-up visits as told by your doctor. This is important. You may need more tests if palpitations do not go  away or get worse. Contact a doctor if:  Your symptoms last more than 24 hours.  Your symptoms occur more often. Get help right away if you:  Have chest pain.  Feel short of breath.  Have a very bad headache.  Feel dizzy.  Pass out (faint). Summary  Palpitations are feelings that your heartbeat is uneven or faster than normal. It may feel like your heart is fluttering or skipping a beat.  Avoid food and  drinks that may cause palpitations. These include caffeine, chocolate, and alcohol.  Try to lower your stress. Do not smoke or use drugs.  Get help right away if you faint or have chest pain, shortness of breath, a severe headache, or dizziness. This information is not intended to replace advice given to you by your health care provider. Make sure you discuss any questions you have with your health care provider. Document Revised: 05/19/2017 Document Reviewed: 05/19/2017 Elsevier Patient Education  2020 [employer].

## 2020-01-03 NOTE — Progress Notes (Signed)
Office Visit    Patient Name: Kelly Doyle Date of Encounter: 01/03/2020  Primary Care Provider:  [facility] [place] Primary Cardiologist:  Kate Sable, MD Electrophysiologist:  None   Chief Complaint    Kelly Doyle is a 64 y.o. female with a hx of anxiety, HTN, DM2, DOE, palpitations presents today for follow up after [facility].    Past Medical History    Past Medical History:  Diagnosis Date  . Anxiety   . Arthritis   . Diabetes mellitus without complication ([place])   . GERD (gastroesophageal reflux disease)   . Hypertension    Past Surgical History:  Procedure Laterality Date  . COLONOSCOPY WITH PROPOFOL N/A 06/23/2017   Procedure: COLONOSCOPY WITH PROPOFOL;  Surgeon: Toledo, Benay Pike, MD;  Location: [facility] ENDOSCOPY;  Service: Gastroenterology;  Laterality: N/A;  . DILATION AND CURETTAGE OF UTERUS  04/20/1973  . NO PAST SURGERIES    . TUBAL LIGATION      Allergies  No Known Allergies  History of Present Illness    Kelly Doyle is a 64 y.o. female with a hx of anxiety, HTN, DM2, DOE, palpitations, COVID 19 08/2019 last seen 12/15/19.  She was seen in consultation by Dr. Garen Lah. She wore ZIO monitor for 3 days 11/2019 with no significant arrhythmia. Echocardiogram 12/01/19 with LVEF 50-55%, no RWMA, gr1DD, RV normal size and function, mild MR. Underwent Lexiscan Myoview 12/26/19 with no evidence of ischemia or scar, low risk study.   Presents today with her son.  We reviewed her cardiac testing in depth.  She was reassured by results of her stress test. Has enrolled in the [facility].  Is excited to start exercising.  Reports no chest pain, pressure, tightness.  Reports no shortness of breath at rest.  Endorses her dyspnea on exertion has improved.  She continues to report intermittent palpitations which are not bothersome.  We discussed her finding of PVC/PAC which were infrequent on ZIO monitor.  She drinks one cup of coffee in the morning.  Does well staying hydrated.  Encouraged her to continue to do so.  We discussed triggers of palpitations including dehydration, alcohol, stress.  EKGs/Labs/Other Studies Reviewed:   The following studies were reviewed today:  Lexiscan Myoview 12/26/19  Normal pharmacologic myocardial perfusion stress test without significant ischemia or scar.  The left ventricular ejection fraction is normal by visual estimation and Siemens calculation (61%). QGS calculation is likely artifactually low due to gating parameters (41%).  There is no significant coronary artery calcification.  This is a low risk study.   Echo 12/01/19  1. Left ventricular ejection fraction, by estimation, is 50 to 55%. The  left ventricle has low normal function. The left ventricle has no regional  wall motion abnormalities. Left ventricular diastolic parameters are  consistent with Grade I diastolic  dysfunction (impaired relaxation).   2. Right ventricular systolic function is normal. The right ventricular  size is normal. Tricuspid regurgitation signal is inadequate for assessing  PA pressure.   3. Mild mitral valve regurgitation.   EKG:  No EKG today.   Recent Labs: 08/24/2019: ALT 19; BUN 25; Creatinine, Ser 1.17; Hemoglobin 11.3; Platelets 175; Potassium 3.5; Sodium 138  Recent Lipid Panel    Component Value Date/Time   CHOL 155 07/09/2016 1859   TRIG 121 07/09/2016 1859   HDL 58 07/09/2016 1859   CHOLHDL 2.7 07/09/2016 1859   LDLCALC 73 07/09/2016 1859    Home Medications   Current Meds  Medication Sig  . acetaminophen (TYLENOL) 650 MG CR tablet SMARTSIG:1-2 Tablet(s) By Mouth Every 8 Hours PRN  . amLODipine (NORVASC) 10 MG tablet Take 10 mg by mouth daily.  Marland Kitchen atorvastatin (LIPITOR) 40 MG tablet Take 1 tablet (40 mg total) by mouth daily.  . cetirizine (ZYRTEC ALLERGY) 10 MG tablet Take 1 tablet (10 mg total) by mouth daily.  . cyclobenzaprine (FLEXERIL) 10 MG tablet Take 1 tablet (10 mg total) by mouth  3 (three) times daily as needed.  Marland Kitchen FLUoxetine (PROZAC) 40 MG capsule Take 40 mg by mouth daily.  . fluticasone (FLONASE) 50 MCG/ACT nasal spray Place 2 sprays into both nostrils daily.  . hydrochlorothiazide (HYDRODIURIL) 25 MG tablet Take 1 tablet (25 mg total) by mouth daily.  . insulin detemir (LEVEMIR) 100 UNIT/ML injection Inject 0.3 mLs (30 Units total) into the skin daily. Each day that the morning blood sugar is greater than 200, add one unit to the evening insulin detemir  dose  . JARDIANCE 25 MG TABS tablet 25 mg daily.   Marland Kitchen lisinopril (ZESTRIL) 30 MG tablet Take 1 tablet (30 mg total) by mouth daily.  Marland Kitchen loperamide (IMODIUM A-D) 2 MG tablet Take 2 tablets (4 mg total) by mouth 4 (four) times daily as needed for diarrhea or loose stools.  Marland Kitchen omeprazole (PRILOSEC) 20 MG capsule Take 20 mg by mouth 2 (two) times daily before a meal.   . VICTOZA 18 MG/3ML SOPN Inject into the skin.  . [DISCONTINUED] cyclobenzaprine (FLEXERIL) 10 MG tablet Take 1 tablet (10 mg total) by mouth 3 (three) times daily as needed.  . [DISCONTINUED] lisinopril (PRINIVIL,ZESTRIL) 20 MG tablet TAKE ONE TABLET BY MOUTH EVERY DAY    Review of Systems    Review of Systems  Constitutional: Negative for chills, fever and malaise/fatigue.  Cardiovascular: Positive for dyspnea on exertion and palpitations. Negative for chest pain, leg swelling, near-syncope, orthopnea and syncope.  Respiratory: Negative for cough, shortness of breath and wheezing.   Gastrointestinal: Negative for nausea and vomiting.  Neurological: Negative for dizziness, light-headedness and weakness.   All other systems reviewed and are otherwise negative except as noted above.  Physical Exam    VS:  BP (!) 144/80 (BP Location: Left Arm, Patient Position: Sitting, Cuff Size: Normal)   Pulse 96   Ht 5\' 2"  (1.575 m)   Wt 161 lb 9.6 oz (73.3 kg)   SpO2 97%   BMI 29.56 kg/m  , BMI Body mass index is 29.56 kg/m. GEN: Well nourished, overweight,  well developed, in no acute distress. HEENT: normal. Neck: Supple, no JVD, carotid bruits, or masses. Cardiac: RRR, no murmurs, rubs, or gallops. No clubbing, cyanosis, edema.  Radials/DP/PT 2+ and equal bilaterally.  Respiratory:  Respirations regular and unlabored, clear to auscultation bilaterally. GI: Soft, nontender, nondistended MS: No deformity or atrophy. Skin: Warm and dry, no rash. Neuro:  Strength and sensation are intact. Psych: Normal affect.   Assessment & Plan    1. DOE -Reports improvement in her dyspnea on exertion since last seen.  She has joined the [employer] and plans to participate in exercise.  Cardiac work-up includes Z monitor 11/20/2019 with no significant arrhythmia, echo 12/01/2019 with normal LVEF, grade 1 diastolic dysfunction, no significant valvular embolus.  She underwent Lexiscan Myoview 12/26/2019 which was low risk study with no evidence of ischemia or previous scar.  Reassurance provided that her dyspnea is noncardiac in origin.  Likely etiology deconditioning.  No further cardiac work-up at this time.  2. Sore throat -endorses phlegm and sore throat upon waking in the morning.  We discussed that this is likely due to allergies and postnasal drip.  She has not been taking allergy medicine and was encouraged to take over-the-counter Zyrtec or Claritin daily.  If does not improve within 1 to 2 weeks, recommend she talk with her primary care provider.  3. Palpitations -ZIO monitor 11/24/2019 with predominantly normal sinus rhythm and infrequent PVC/PAC.  PVC burden 3%.  Reports her palpitations are infrequent and overall not bothersome.  We discussed avoiding caffeine, staying well-hydrated, and effective coping mechanisms for stress.  No indication for beta-blocker or calcium channel blocker at this time.  4. HTN -BP elevated in clinic today.  Has been elevated on recent clinic visits as well.  She will continue amlodipine 10 mg daily, hydrochlorothiazide 25 mg daily.  We  will increase her lisinopril from 20 mg to 30 mg daily.  BMP for monitoring in 2 weeks time.  5. HLD - Continue atorvastatin 40 mg daily per PCP.  6. DM2 - Continue to follow with PCP.   Disposition: Follow up in 6 month(s) with Dr. Garen Lah or APP.    Loel Dubonnet, NP 01/03/2020, 11:50 AM

## 2020-04-10 ENCOUNTER — Encounter: Payer: Self-pay | Admitting: Emergency Medicine

## 2020-04-10 ENCOUNTER — Emergency Department
Admission: EM | Admit: 2020-04-10 | Discharge: 2020-04-10 | Disposition: A | Discharge status: Home / Self Care | Payer: Medicare HMO | Attending: Emergency Medicine | Admitting: Emergency Medicine

## 2020-04-10 ENCOUNTER — Other Ambulatory Visit: Payer: Self-pay

## 2020-04-10 DIAGNOSIS — E119 Type 2 diabetes mellitus without complications: Secondary | ICD-10-CM | POA: Insufficient documentation

## 2020-04-10 DIAGNOSIS — I129 Hypertensive chronic kidney disease with stage 1 through stage 4 chronic kidney disease, or unspecified chronic kidney disease: Secondary | ICD-10-CM | POA: Diagnosis not present

## 2020-04-10 DIAGNOSIS — N183 Chronic kidney disease, stage 3 unspecified: Secondary | ICD-10-CM | POA: Diagnosis not present

## 2020-04-10 DIAGNOSIS — E860 Dehydration: Secondary | ICD-10-CM

## 2020-04-10 DIAGNOSIS — Z79899 Other long term (current) drug therapy: Secondary | ICD-10-CM | POA: Insufficient documentation

## 2020-04-10 DIAGNOSIS — Z794 Long term (current) use of insulin: Secondary | ICD-10-CM | POA: Insufficient documentation

## 2020-04-10 DIAGNOSIS — R197 Diarrhea, unspecified: Secondary | ICD-10-CM | POA: Diagnosis not present

## 2020-04-10 LAB — CBC WITH DIFFERENTIAL/PLATELET
Abs Immature Granulocytes: 0.02 10*3/uL (ref 0.00–0.07)
Basophils Absolute: 0 10*3/uL (ref 0.0–0.1)
Basophils Relative: 0 %
Eosinophils Absolute: 0.1 10*3/uL (ref 0.0–0.5)
Eosinophils Relative: 1 %
HCT: 38.6 % (ref 36.0–46.0)
Hemoglobin: 12.3 g/dL (ref 12.0–15.0)
Immature Granulocytes: 0 %
Lymphocytes Relative: 39 %
Lymphs Abs: 3.1 10*3/uL (ref 0.7–4.0)
MCH: 29.3 pg (ref 26.0–34.0)
MCHC: 31.9 g/dL (ref 30.0–36.0)
MCV: 91.9 fL (ref 80.0–100.0)
Monocytes Absolute: 0.6 10*3/uL (ref 0.1–1.0)
Monocytes Relative: 7 %
Neutro Abs: 4.1 10*3/uL (ref 1.7–7.7)
Neutrophils Relative %: 53 %
Platelets: 268 10*3/uL (ref 150–400)
RBC: 4.2 MIL/uL (ref 3.87–5.11)
RDW: 12 % (ref 11.5–15.5)
WBC: 7.9 10*3/uL (ref 4.0–10.5)
nRBC: 0 % (ref 0.0–0.2)

## 2020-04-10 LAB — COMPREHENSIVE METABOLIC PANEL
ALT: 14 U/L (ref 0–44)
AST: 15 U/L (ref 15–41)
Albumin: 3.7 g/dL (ref 3.5–5.0)
Alkaline Phosphatase: 67 U/L (ref 38–126)
Anion gap: 11 (ref 5–15)
BUN: 26 mg/dL — ABNORMAL HIGH (ref 8–23)
CO2: 24 mmol/L (ref 22–32)
Calcium: 9.5 mg/dL (ref 8.9–10.3)
Chloride: 105 mmol/L (ref 98–111)
Creatinine, Ser: 1.42 mg/dL — ABNORMAL HIGH (ref 0.44–1.00)
GFR, Estimated: 41 mL/min — ABNORMAL LOW (ref >60)
Glucose, Bld: 183 mg/dL — ABNORMAL HIGH (ref 70–99)
Potassium: 4.1 mmol/L (ref 3.5–5.1)
Sodium: 140 mmol/L (ref 135–145)
Total Bilirubin: 0.9 mg/dL (ref 0.3–1.2)
Total Protein: 7.4 g/dL (ref 6.5–8.1)

## 2020-04-10 LAB — URINALYSIS, COMPLETE (UACMP) WITH MICROSCOPIC
Bilirubin Urine: NEGATIVE
Glucose, UA: >=500 mg/dL — AB
Ketones, ur: NEGATIVE mg/dL
Nitrite: NEGATIVE
Protein, ur: NEGATIVE mg/dL
Specific Gravity, Urine: 1.03 (ref 1.005–1.030)
WBC, UA: >50 WBC/hpf — ABNORMAL HIGH (ref 0–5)
pH: 5 (ref 5.0–8.0)

## 2020-04-10 LAB — LIPASE, BLOOD: Lipase: 27 U/L (ref 11–51)

## 2020-04-10 MED ORDER — LOPERAMIDE HCL 2 MG PO TABS: 2.0000 mg | ORAL_TABLET | Freq: Four times a day (QID) | ORAL | 0 refills | Status: DC | PRN

## 2020-04-10 NOTE — ED Notes (Signed)
MD at bedside. 

## 2020-04-10 NOTE — ED Triage Notes (Signed)
Patient ambulatory to triage with steady gait, without difficulty or distress noted; pt reports diarrhea x 2 days; denies any accomp symptoms, denies pain

## 2020-04-10 NOTE — ED Provider Notes (Signed)
[facility] Emergency Department Provider Note   ____________________________________________   Event Date/Time   First MD Initiated Contact with Patient 04/10/20 1047     (approximate)  I have reviewed the triage vital signs and the nursing notes.   HISTORY  Chief Complaint Diarrhea    HPI Kelly Doyle is a 64 y.o. female with a stated past medical history of GERD, type 2 diabetes, anxiety, and hypertension who presents for 2 days of watery diarrhea.  Patient denies any associated abdominal pain, vomiting, dysuria, or flank pain.  Patient does endorse recent sick contact of her cousins a baby who had similar symptoms and then she saw approximately 1 day prior to symptoms beginning.  Patient states that the frequency of her diarrhea has decreased at this time but she is concerned that she may be dehydrated.  Patient currently denies any vision changes, tinnitus, difficulty speaking, facial droop, sore throat, chest pain, shortness of breath, abdominal pain, nausea/vomiting, dysuria, or weakness/numbness/paresthesias in any extremity         Past Medical History:  Diagnosis Date  . Anxiety   . Arthritis   . Diabetes mellitus without complication ([place])   . GERD (gastroesophageal reflux disease)   . Hypertension     Patient Active Problem List   Diagnosis Date Noted  . Insomnia 02/13/2016  . CKD (chronic kidney disease) stage 3, GFR 30-59 ml/min ([facility]) 02/13/2016  . Hyperlipidemia 10/03/2015  . Iron deficiency anemia 10/03/2015  . Major depressive disorder, single episode, severe without psychotic features ([place]) 10/03/2014  . 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 Diabetes ([place]) 10/03/2014  . Hypertension 10/03/2014    Past Surgical History:  Procedure Laterality Date  . COLONOSCOPY WITH PROPOFOL N/A 06/23/2017   Procedure: COLONOSCOPY WITH PROPOFOL;  Surgeon: Toledo, Benay Pike, MD;  Location: [facility] ENDOSCOPY;  Service: Gastroenterology;   Laterality: N/A;  . DILATION AND CURETTAGE OF UTERUS  04/20/1973  . NO PAST SURGERIES    . TUBAL LIGATION      Prior to Admission medications   Medication Sig Start Date End Date Taking? Authorizing Provider  acetaminophen (TYLENOL) 650 MG CR tablet SMARTSIG:1-2 Tablet(s) By Mouth Every 8 Hours PRN 07/25/19   [provider]  amLODipine (NORVASC) 10 MG tablet Take 10 mg by mouth daily. 10/27/19   [provider]  atorvastatin (LIPITOR) 40 MG tablet Take 1 tablet (40 mg total) by mouth daily. 07/16/16   Doles-Johnson, Teah, NP  cetirizine (ZYRTEC ALLERGY) 10 MG tablet Take 1 tablet (10 mg total) by mouth daily. 09/13/16   Duanne Guess, PA-C  cyclobenzaprine (FLEXERIL) 10 MG tablet Take 1 tablet (10 mg total) by mouth 3 (three) times daily as needed. 09/29/16   Sable Feil, PA-C  FLUoxetine (PROZAC) 40 MG capsule Take 40 mg by mouth daily. 08/01/19   [provider]  fluticasone (FLONASE) 50 MCG/ACT nasal spray Place 2 sprays into both nostrils daily. 09/13/16 01/03/20  Duanne Guess, PA-C  hydrochlorothiazide (HYDRODIURIL) 25 MG tablet Take 1 tablet (25 mg total) by mouth daily. 07/16/16   Doles-Johnson, Teah, NP  insulin detemir (LEVEMIR) 100 UNIT/ML injection Inject 0.3 mLs (30 Units total) into the skin daily. Each day that the morning blood sugar is greater than 200, add one unit to the evening insulin detemir  dose 07/16/16   Doles-Johnson, Teah, NP  JARDIANCE 25 MG TABS tablet 25 mg daily.  09/24/19   [provider]  lisinopril (ZESTRIL) 30 MG tablet Take 1  tablet (30 mg total) by mouth daily. 01/03/20   Loel Dubonnet, NP  loperamide (IMODIUM A-D) 2 MG tablet Take 2 tablets (4 mg total) by mouth 4 (four) times daily as needed for diarrhea or loose stools. 08/24/19   Carrie Mew, MD  loperamide (IMODIUM A-D) 2 MG tablet Take 1 tablet (2 mg total) by mouth 4 (four) times daily as needed for diarrhea or loose stools. 04/10/20   Naaman Plummer, MD   omeprazole (PRILOSEC) 20 MG capsule Take 20 mg by mouth 2 (two) times daily before a meal.  09/24/19   [provider]  VICTOZA 18 MG/3ML SOPN Inject into the skin. 10/27/19   [provider]    Allergies Patient has no known allergies.  Family History  Problem Relation Age of Onset  . Alzheimer's disease Mother   . Hypertension Mother   . Diabetes Sister   . Gout Brother     Social History Social History   Tobacco Use  . Smoking status: Never Smoker  . Smokeless tobacco: Never Used  Vaping Use  . Vaping Use: Never used  Substance Use Topics  . Alcohol use: No  . Drug use: No    Review of Systems Constitutional: No fever/chills Eyes: No visual changes. ENT: No sore throat. Cardiovascular: Denies chest pain. Respiratory: Denies shortness of breath. Gastrointestinal: No abdominal pain.  No nausea, no vomiting.  Endorses diarrhea. Genitourinary: Negative for dysuria. Musculoskeletal: Negative for acute arthralgias Skin: Negative for rash. Neurological: Negative for headaches, weakness/numbness/paresthesias in any extremity Psychiatric: Negative for suicidal ideation/homicidal ideation   ____________________________________________   PHYSICAL EXAM:  VITAL SIGNS: ED Triage Vitals [04/10/20 0520]  Enc Vitals Group     BP 140/79     Pulse Rate 80     Resp 18     Temp 97.8 F (36.6 C)     Temp Source Oral     SpO2 97 %     Weight 160 lb (72.6 kg)     Height 5\' 2"  (1.575 m)     Head Circumference      Peak Flow      Pain Score 0     Pain Loc      Pain Edu?      Excl. in [place]?    Constitutional: Alert and oriented. Well appearing and in no acute distress. Eyes: Conjunctivae are normal. PERRL. Head: Atraumatic. Nose: No congestion/rhinnorhea. Mouth/Throat: Mucous membranes are moist. Neck: No stridor Cardiovascular: Grossly normal heart sounds.  Good peripheral circulation. Respiratory: Normal respiratory effort.  No  retractions. Gastrointestinal: Soft and nontender. No distention. Musculoskeletal: No obvious deformities Neurologic:  Normal speech and language. No gross focal neurologic deficits are appreciated. Skin:  Skin is warm and dry. No rash noted. Psychiatric: Mood and affect are normal. Speech and behavior are normal.  ____________________________________________   LABS (all labs ordered are listed, but only abnormal results are displayed)  Labs Reviewed  COMPREHENSIVE METABOLIC PANEL - Abnormal; Notable for the following components:      Result Value   Glucose, Bld 183 (*)    BUN 26 (*)    Creatinine, Ser 1.42 (*)    GFR, Estimated 41 (*)    All other components within normal limits  URINALYSIS, COMPLETE (UACMP) WITH MICROSCOPIC - Abnormal; Notable for the following components:   Color, Urine YELLOW (*)    APPearance CLOUDY (*)    Glucose, UA >=500 (*)    Hgb urine dipstick SMALL (*)    Leukocytes,Ua  LARGE (*)    WBC, UA >50 (*)    Bacteria, UA RARE (*)    All other components within normal limits  CBC WITH DIFFERENTIAL/PLATELET  LIPASE, BLOOD   PROCEDURES  Procedure(s) performed (including Critical Care):  .1-3 Lead EKG Interpretation Performed by: Naaman Plummer, MD Authorized by: Naaman Plummer, MD     Interpretation: normal     ECG rate:  89   ECG rate assessment: normal     Rhythm: sinus rhythm     Ectopy: none     Conduction: normal       ____________________________________________   INITIAL IMPRESSION / ASSESSMENT AND PLAN / ED COURSE  As part of my medical decision making, I reviewed the following data within the [place] notes reviewed and incorporated, Labs reviewed, Old chart reviewed, and Notes from prior ED visits reviewed and incorporated      The cause of the patient's symptoms is not clear, but the patient is overall well appearing and is suspected to have a transient course of illness.  Given History and Exam  there does not appear to be an emergent cause of the symptoms such as small bowel obstruction, coronary syndrome, bowel ischemia, DKA, pancreatitis, appendicitis, other acute abdomen or other emergent problem.  Reassessment: After treatment, the patient is feeling much better, tolerating PO fluids, and shows no signs of dehydration.   Disposition: Discharge home with prompt primary care physician follow up in the next 48 hours. Strict return precautions discussed.       ____________________________________________   FINAL CLINICAL IMPRESSION(S) / ED DIAGNOSES  Final diagnoses:  Diarrhea in adult patient  Dehydration     ED Discharge Orders         Ordered    loperamide (IMODIUM A-D) 2 MG tablet  4 times daily PRN        04/10/20 1055           Note:  This document was prepared using Dragon voice recognition software and may include unintentional dictation errors.   Naaman Plummer, MD 04/10/20 1106

## 2020-04-16 ENCOUNTER — Telehealth: Payer: Self-pay | Admitting: Family

## 2020-04-16 DIAGNOSIS — E1159 Type 2 diabetes mellitus with other circulatory complications: Secondary | ICD-10-CM

## 2020-04-16 MED ORDER — LISINOPRIL 30 MG PO TABS: 30.0000 mg | ORAL_TABLET | Freq: Every day | ORAL | 0 refills | Status: DC

## 2020-04-16 NOTE — Telephone Encounter (Signed)
*  STAT* If patient is at the pharmacy, call can be transferred to refill team.   1. Which medications need to be refilled? (please list name of each medication and dose if known) Lisinopril  2. Which pharmacy/location (including street and city if local pharmacy) is medication to be sent to?[facility]  3. Do they need a 30 day or 90 day supply? 90 days and refills

## 2020-04-16 NOTE — Telephone Encounter (Signed)
Requested Prescriptions   Signed Prescriptions Disp Refills   lisinopril (ZESTRIL) 30 MG tablet 90 tablet 0    Sig: Take 1 tablet (30 mg total) by mouth daily.    Authorizing Provider: Alver Sorrow    Ordering User: Thayer Headings, Payal Stanforth L

## 2020-06-27 ENCOUNTER — Encounter: Payer: Self-pay | Admitting: Certified Nurse Midwife

## 2020-07-08 ENCOUNTER — Ambulatory Visit: Payer: Medicare HMO | Admitting: Cardiology

## 2020-07-09 ENCOUNTER — Encounter: Payer: Self-pay | Admitting: Cardiology

## 2020-07-26 ENCOUNTER — Encounter: Payer: Self-pay | Admitting: Certified Nurse Midwife

## 2020-07-31 ENCOUNTER — Encounter: Payer: Self-pay | Admitting: Certified Nurse Midwife

## 2020-08-19 ENCOUNTER — Telehealth: Payer: Self-pay | Admitting: Cardiology

## 2020-08-19 NOTE — Telephone Encounter (Signed)
Received fax from [facility] ([place] notes). Pt no showed last appt on 07/08/20 with Dr. Garen Lah. Pt is overdue for 6 month follow-up, last seen 12/2019.   Please call pt to schedule appt with Dr. Garen Lah or APP.  Thank you!

## 2020-08-19 NOTE — Telephone Encounter (Signed)
Attempted to schedule.  No ans no vm  

## 2020-09-18 ENCOUNTER — Encounter: Payer: Medicare HMO | Admitting: Certified Nurse Midwife

## 2020-10-29 ENCOUNTER — Other Ambulatory Visit: Payer: Self-pay

## 2020-10-29 ENCOUNTER — Emergency Department
Admission: EM | Admit: 2020-10-29 | Discharge: 2020-10-29 | Disposition: A | Discharge status: Home / Self Care | Payer: Medicare HMO | Attending: Emergency Medicine | Admitting: Emergency Medicine

## 2020-10-29 ENCOUNTER — Encounter: Payer: Self-pay | Admitting: Emergency Medicine

## 2020-10-29 DIAGNOSIS — H9201 Otalgia, right ear: Secondary | ICD-10-CM | POA: Diagnosis present

## 2020-10-29 DIAGNOSIS — N183 Chronic kidney disease, stage 3 unspecified: Secondary | ICD-10-CM | POA: Diagnosis not present

## 2020-10-29 DIAGNOSIS — E119 Type 2 diabetes mellitus without complications: Secondary | ICD-10-CM | POA: Diagnosis not present

## 2020-10-29 DIAGNOSIS — Z794 Long term (current) use of insulin: Secondary | ICD-10-CM | POA: Insufficient documentation

## 2020-10-29 DIAGNOSIS — Z79899 Other long term (current) drug therapy: Secondary | ICD-10-CM | POA: Diagnosis not present

## 2020-10-29 DIAGNOSIS — I129 Hypertensive chronic kidney disease with stage 1 through stage 4 chronic kidney disease, or unspecified chronic kidney disease: Secondary | ICD-10-CM | POA: Diagnosis not present

## 2020-10-29 DIAGNOSIS — H66004 Acute suppurative otitis media without spontaneous rupture of ear drum, recurrent, right ear: Secondary | ICD-10-CM | POA: Insufficient documentation

## 2020-10-29 MED ORDER — AMOXICILLIN-POT CLAVULANATE 875-125 MG PO TABS: 1.0000 | ORAL_TABLET | Freq: Two times a day (BID) | ORAL | 0 refills | Status: AC

## 2020-10-29 NOTE — ED Triage Notes (Signed)
C/O right ear pain.  States has been treated with amoxicillin for ear infection, but pain persists.  AAOx3.  Skin warm and dry. NAD

## 2020-10-29 NOTE — Discharge Instructions (Addendum)
Her blood pressure presented quite elevated today.  This could be certainly chronic pain although I do recommend he have this rechecked in a couple days by her primary care doctor.

## 2020-10-29 NOTE — ED Notes (Signed)
See triage note  Presents with right ear pain  States she was seen and placed on amoxil by [facility]   conts' to have pain

## 2020-10-29 NOTE — ED Provider Notes (Signed)
[facility] Emergency Department Provider Note  ____________________________________________   Event Date/Time   First MD Initiated Contact with Patient 10/29/20 1353     (approximate)  I have reviewed the triage vital signs and the nursing notes.   HISTORY  Chief Complaint Ear Pain   HPI Kelly Doyle is a 65 y.o. female with past medical history metastatic arthritis, DM, GERD and HTN who presents for assessment approximate 1 day of some pain in the right ear.  Patient states he completed a course amoxicillin about 3 weeks ago and has been pain-free for the last week or so he start develop some right ear pressure again today.  She denies any fevers, left-sided ear discomfort, chest pain, cough, shortness of breath, back pain, neck pain, abdominal pain, nausea, vomiting, diarrhea, dysuria, rash or any trauma or injuries or foreign bodies into the ear.  No clear alleviating aggravating factors.  No other acute concerns at this time.         Past Medical History:  Diagnosis Date  . Anxiety   . Arthritis   . Diabetes mellitus without complication ([place])   . GERD (gastroesophageal reflux disease)   . Hypertension     Patient Active Problem List   Diagnosis Date Noted  . Insomnia 02/13/2016  . CKD (chronic kidney disease) stage 3, GFR 30-59 ml/min (HCC) 02/13/2016  . Hyperlipidemia 10/03/2015  . Iron deficiency anemia 10/03/2015  . Major depressive disorder, single episode, severe without psychotic features ([place]) 10/03/2014  . 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 Diabetes ([place]) 10/03/2014  . Hypertension 10/03/2014    Past Surgical History:  Procedure Laterality Date  . COLONOSCOPY WITH PROPOFOL N/A 06/23/2017   Procedure: COLONOSCOPY WITH PROPOFOL;  Surgeon: Toledo, Benay Pike, MD;  Location: [facility] ENDOSCOPY;  Service: Gastroenterology;  Laterality: N/A;  . DILATION AND CURETTAGE OF UTERUS  04/20/1973  . NO PAST SURGERIES    . TUBAL LIGATION       Prior to Admission medications   Medication Sig Start Date End Date Taking? Authorizing Provider  amoxicillin-clavulanate (AUGMENTIN) 875-125 MG tablet Take 1 tablet by mouth 2 (two) times daily for 7 days. 10/29/20 11/05/20 Yes Lucrezia Starch, MD  acetaminophen (TYLENOL) 650 MG CR tablet SMARTSIG:1-2 Tablet(s) By Mouth Every 8 Hours PRN 07/25/19   [provider]  amLODipine (NORVASC) 10 MG tablet Take 10 mg by mouth daily. 10/27/19   [provider]  atorvastatin (LIPITOR) 40 MG tablet Take 1 tablet (40 mg total) by mouth daily. 07/16/16   Doles-Johnson, Teah, NP  cetirizine (ZYRTEC ALLERGY) 10 MG tablet Take 1 tablet (10 mg total) by mouth daily. 09/13/16   Duanne Guess, PA-C  cyclobenzaprine (FLEXERIL) 10 MG tablet Take 1 tablet (10 mg total) by mouth 3 (three) times daily as needed. 09/29/16   Sable Feil, PA-C  FLUoxetine (PROZAC) 40 MG capsule Take 40 mg by mouth daily. 08/01/19   [provider]  fluticasone (FLONASE) 50 MCG/ACT nasal spray Place 2 sprays into both nostrils daily. 09/13/16 01/03/20  Duanne Guess, PA-C  hydrochlorothiazide (HYDRODIURIL) 25 MG tablet Take 1 tablet (25 mg total) by mouth daily. 07/16/16   Doles-Johnson, Teah, NP  insulin detemir (LEVEMIR) 100 UNIT/ML injection Inject 0.3 mLs (30 Units total) into the skin daily. Each day that the morning blood sugar is greater than 200, add one unit to the evening insulin detemir  dose 07/16/16   Doles-Johnson, Teah, NP  JARDIANCE 25 MG TABS tablet 25  mg daily.  09/24/19   [provider]  lisinopril (ZESTRIL) 30 MG tablet Take 1 tablet (30 mg total) by mouth daily. 04/16/20   Loel Dubonnet, NP  loperamide (IMODIUM A-D) 2 MG tablet Take 2 tablets (4 mg total) by mouth 4 (four) times daily as needed for diarrhea or loose stools. 08/24/19   Carrie Mew, MD  loperamide (IMODIUM A-D) 2 MG tablet Take 1 tablet (2 mg total) by mouth 4 (four) times daily as needed for diarrhea or loose  stools. 04/10/20   Naaman Plummer, MD  omeprazole (PRILOSEC) 20 MG capsule Take 20 mg by mouth 2 (two) times daily before a meal.  09/24/19   [provider]  VICTOZA 18 MG/3ML SOPN Inject into the skin. 10/27/19   [provider]    Allergies Patient has no known allergies.  Family History  Problem Relation Age of Onset  . Alzheimer's disease Mother   . Hypertension Mother   . Diabetes Sister   . Gout Brother     Social History Social History   Tobacco Use  . Smoking status: Never  . Smokeless tobacco: Never  Vaping Use  . Vaping Use: Never used  Substance Use Topics  . Alcohol use: No  . Drug use: No    Review of Systems  Review of Systems  Constitutional:  Negative for chills and fever.  HENT:  Positive for ear pain. Negative for sore throat.   Eyes:  Negative for pain.  Respiratory:  Negative for cough and stridor.   Cardiovascular:  Negative for chest pain.  Gastrointestinal:  Negative for vomiting.  Genitourinary:  Negative for dysuria.  Musculoskeletal:  Negative for myalgias.  Skin:  Negative for rash.  Neurological:  Negative for seizures, loss of consciousness and headaches.  Psychiatric/Behavioral:  Negative for suicidal ideas.   All other systems reviewed and are negative.    ____________________________________________   PHYSICAL EXAM:  VITAL SIGNS: ED Triage Vitals  Enc Vitals Group     BP 10/29/20 1256 (!) 178/113     Pulse Rate 10/29/20 1256 86     Resp 10/29/20 1256 18     Temp 10/29/20 1256 97.9 F (36.6 C)     Temp Source 10/29/20 1256 Oral     SpO2 10/29/20 1256 99 %     Weight 10/29/20 1252 160 lb 0.9 oz (72.6 kg)     Height 10/29/20 1252 5\' 2"  (1.575 m)     Head Circumference --      Peak Flow --      Pain Score 10/29/20 1252 7     Pain Loc --      Pain Edu? --      Excl. in [place]? --    Vitals:   10/29/20 1256  BP: (!) 178/113  Pulse: 86  Resp: 18  Temp: 97.9 F (36.6 C)  SpO2: 99%   Physical  Exam Vitals and nursing note reviewed.  Constitutional:      General: She is not in acute distress.    Appearance: She is well-developed.  HENT:     Head: Normocephalic and atraumatic.     Right Ear: External ear normal.     Left Ear: External ear normal.     Nose: Nose normal.  Eyes:     Conjunctiva/sclera: Conjunctivae normal.  Cardiovascular:     Rate and Rhythm: Normal rate and regular rhythm.     Heart sounds: No murmur heard. Pulmonary:     Effort:  Pulmonary effort is normal. No respiratory distress.     Breath sounds: Normal breath sounds.  Abdominal:     Palpations: Abdomen is soft.     Tenderness: There is no abdominal tenderness.  Musculoskeletal:     Cervical back: Neck supple.  Skin:    General: Skin is warm and dry.     Capillary Refill: Capillary refill takes less than 2 seconds.  Neurological:     Mental Status: She is alert and oriented to person, place, and time.  Psychiatric:        Mood and Affect: Mood normal.    Right TM has some injection erythema.  Left TM is unremarkable.  There is no erythema induration or tenderness over the right mastoid area.  PERRLA.  EOMI.  Patient is not meningitic.  Oropharynx is unremarkable. ____________________________________________   LABS (all labs ordered are listed, but only abnormal results are displayed)  Labs Reviewed - No data to display ____________________________________________  EKG  ____________________________________________  RADIOLOGY  ED MD interpretation:    Official radiology report(s): No results found.  ____________________________________________   PROCEDURES  Procedure(s) performed (including Critical Care):  Procedures   ____________________________________________   INITIAL IMPRESSION / ASSESSMENT AND PLAN / ED COURSE     Patient presents with above to history exam for recurrence of right ear pain after recent diagnosis of otitis media that initially improved with  amoxicillin but came back today.  On arrival she is hypertensive with a BP of 170/113 with otherwise stable vital signs on room air.  On exam she does have evidence of what appears to be recurrence of otitis media without evidence of rupture of TM.  She does not have evidence of mastoiditis or other deep space infection in the head or neck and Leave she is septic or meningitic.  We will give course of Augmentin and have her see PCP.  Discharged stable condition.  Strict return cautions advised and discussed.  Advised in writing to have her BP rechecked by her PCP.        ____________________________________________   FINAL CLINICAL IMPRESSION(S) / ED DIAGNOSES  Final diagnoses:  Recurrent acute suppurative otitis media of right ear without spontaneous rupture of tympanic membrane    Medications - No data to display   ED Discharge Orders          Ordered    amoxicillin-clavulanate (AUGMENTIN) 875-125 MG tablet  2 times daily        10/29/20 1424             Note:  This document was prepared using Dragon voice recognition software and may include unintentional dictation errors.    Lucrezia Starch, MD 10/29/20 1435

## 2020-10-30 ENCOUNTER — Other Ambulatory Visit: Payer: Self-pay

## 2020-10-30 MED ORDER — GOLYTELY 236 G PO SOLR: 4000.0000 mL | Freq: Once | ORAL | 0 refills | Status: AC

## 2020-10-31 ENCOUNTER — Other Ambulatory Visit: Payer: Self-pay

## 2020-11-12 ENCOUNTER — Encounter: Admission: RE | Payer: Self-pay | Source: Home / Self Care

## 2020-11-12 ENCOUNTER — Ambulatory Visit: Admission: RE | Admit: 2020-11-12 | Payer: Medicare HMO | Source: Home / Self Care | Admitting: Gastroenterology

## 2020-11-12 SURGERY — COLONOSCOPY
Anesthesia: General

## 2020-12-06 ENCOUNTER — Ambulatory Visit (INDEPENDENT_AMBULATORY_CARE_PROVIDER_SITE_OTHER): Payer: Medicare Other | Admitting: Cardiology

## 2020-12-06 ENCOUNTER — Encounter: Payer: Self-pay | Admitting: Cardiology

## 2020-12-06 ENCOUNTER — Other Ambulatory Visit: Payer: Self-pay

## 2020-12-06 VITALS — BP 130/64 | HR 94 | Ht 62.0 in | Wt 158.0 lb

## 2020-12-06 DIAGNOSIS — E7800 Pure hypercholesterolemia, unspecified: Secondary | ICD-10-CM

## 2020-12-06 DIAGNOSIS — R0609 Other forms of dyspnea: Secondary | ICD-10-CM

## 2020-12-06 DIAGNOSIS — I10 Essential (primary) hypertension: Secondary | ICD-10-CM | POA: Diagnosis not present

## 2020-12-06 DIAGNOSIS — R0600 Dyspnea, unspecified: Secondary | ICD-10-CM | POA: Diagnosis not present

## 2020-12-06 NOTE — Progress Notes (Signed)
Cardiology Office Note:    Date:  12/06/2020   ID:  Kelly Doyle, DOB 05/04/1955, MRN [id number]  PCP:  [facility], [place] Cardiologist:  Kate Sable, MD  [place] Electrophysiologist:  None   Referring MD: [facility], [place]   Chief Complaint  Patient presents with   Other    Past due follow up -- Meds reviewed verbally with patient.     History of Present Illness:    Kelly Doyle is a 65 y.o. female with a hx of anxiety, hypertension, diabetes who presents for follow-up.  Last seen for shortness of breath with exertion.  Previous echo had preserved ejection fraction.  Lexiscan Myoview was obtained but patient did not follow-up.  States shortness of breath overall is improved.  Currently dealing with some stresses living with her daughter but otherwise she feels well.  Her blood pressure is adequately controlled on current medications.  Has no concerns at this time.    Past Medical History:  Diagnosis Date   Anxiety    Arthritis    Diabetes mellitus without complication (HCC)    GERD (gastroesophageal reflux disease)    Hypertension     Past Surgical History:  Procedure Laterality Date   COLONOSCOPY WITH PROPOFOL N/A 06/23/2017   Procedure: COLONOSCOPY WITH PROPOFOL;  Surgeon: Toledo, Benay Pike, MD;  Location: [facility] ENDOSCOPY;  Service: Gastroenterology;  Laterality: N/A;   DILATION AND CURETTAGE OF UTERUS  04/20/1973   NO PAST SURGERIES     TUBAL LIGATION      Current Medications: Current Meds  Medication Sig   acetaminophen (TYLENOL) 650 MG CR tablet SMARTSIG:1-2 Tablet(s) By Mouth Every 8 Hours PRN   amLODipine (NORVASC) 10 MG tablet Take 10 mg by mouth daily.   atorvastatin (LIPITOR) 40 MG tablet Take 1 tablet (40 mg total) by mouth daily.   cetirizine (ZYRTEC ALLERGY) 10 MG tablet Take 1 tablet (10 mg total) by mouth daily.   fluticasone (FLONASE) 50 MCG/ACT nasal spray Place 2 sprays into both nostrils daily.    hydrochlorothiazide (HYDRODIURIL) 25 MG tablet Take 1 tablet (25 mg total) by mouth daily.   insulin detemir (LEVEMIR) 100 UNIT/ML injection Inject 0.3 mLs (30 Units total) into the skin daily. Each day that the morning blood sugar is greater than 200, add one unit to the evening insulin detemir  dose   lisinopril (ZESTRIL) 30 MG tablet Take 1 tablet (30 mg total) by mouth daily.   omeprazole (PRILOSEC) 20 MG capsule Take 20 mg by mouth 2 (two) times daily before a meal.      Allergies:   Patient has no known allergies.   Social History   Socioeconomic History   Marital status: Single    Spouse name: Not on file   Number of children: Not on file   Years of education: Not on file   Highest education level: Not on file  Occupational History   Not on file  Tobacco Use   Smoking status: Never   Smokeless tobacco: Never  Vaping Use   Vaping Use: Never used  Substance and Sexual Activity   Alcohol use: No   Drug use: No   Sexual activity: Yes    Birth control/protection: None  Other Topics Concern   Not on file  Social History Narrative   Not on file   Social Determinants of Health   Financial Resource Strain: Not on file  Food Insecurity: Not on file  Transportation Needs: Not on  file  Physical Activity: Not on file  Stress: Not on file  Social Connections: Not on file     Family History: The patient's family history includes Alzheimer's disease in her mother; Diabetes in her sister; Gout in her brother; Hypertension in her mother.  ROS:   Please see the history of present illness.     All other systems reviewed and are negative.  EKGs/Labs/Other Studies Reviewed:    The following studies were reviewed today:   EKG:  EKG is  ordered today.  EKG shows normal sinus rhythm, nonspecific T wave changes.  Recent Labs: 04/10/2020: ALT 14; BUN 26; Creatinine, Ser 1.42; Hemoglobin 12.3; Platelets 268; Potassium 4.1; Sodium 140  Recent Lipid Panel    Component Value  Date/Time   CHOL 155 07/09/2016 1859   TRIG 121 07/09/2016 1859   HDL 58 07/09/2016 1859   CHOLHDL 2.7 07/09/2016 1859   LDLCALC 73 07/09/2016 1859    Physical Exam:    VS:  BP 130/64 (BP Location: Left Arm, Patient Position: Sitting, Cuff Size: Normal)   Pulse 94   Ht '5\' 2"'$  (1.575 m)   Wt 158 lb (71.7 kg)   SpO2 97%   BMI 28.90 kg/m     Wt Readings from Last 3 Encounters:  12/06/20 158 lb (71.7 kg)  10/29/20 160 lb 0.9 oz (72.6 kg)  04/10/20 160 lb (72.6 kg)     GEN:  Well nourished, well developed in no acute distress HEENT: Normal NECK: No JVD; No carotid bruits LYMPHATICS: No lymphadenopathy CARDIAC: RRR, no murmurs, rubs, gallops RESPIRATORY:  Clear to auscultation without rales, wheezing or rhonchi  ABDOMEN: Soft, non-tender, non-distended MUSCULOSKELETAL:  No edema; No deformity  SKIN: Warm and dry NEUROLOGIC:  Alert and oriented x 3 PSYCHIATRIC:  Normal affect   ASSESSMENT:    1. Dyspnea on exertion   2. Primary hypertension   3. Pure hypercholesterolemia     PLAN:    In order of problems listed above:  Dyspnea on exertion, symptoms overall improved.  Prior echo with EF 50 to 55%, impaired relaxation, mild MR. Lexiscan Myoview with no evidence for ischemia.  Symptoms of shortness of breath likely from deconditioning. hypertension, BP controlled.  Continue lisinopril, amlodipine, HCTZ.   hyperlipidemia, cholesterol controlled, continue Lipitor.    Follow-up as needed   This note was generated in part or whole with voice recognition software. Voice recognition is usually quite accurate but there are transcription errors that can and very often do occur. I apologize for any typographical errors that were not detected and corrected.  Medication Adjustments/Labs and Tests Ordered: Current medicines are reviewed at length with the patient today.  Concerns regarding medicines are outlined above.  Orders Placed This Encounter  Procedures   EKG 12-Lead     No orders of the defined types were placed in this encounter.   Patient Instructions  Medication Instructions:  Your physician recommends that you continue on your current medications as directed. Please refer to the Current Medication list given to you today.  *If you need a refill on your cardiac medications before your next appointment, please call your pharmacy*   Lab Work: None ordered If you have labs (blood work) drawn today and your tests are completely normal, you will receive your results only by: [place] (if you have MyChart) OR A paper copy in the mail If you have any lab test that is abnormal or we need to change your treatment, we will call you to  review the results.   Testing/Procedures: None ordered   Follow-Up: At [facility], you and your health needs are our priority.  As part of our continuing mission to provide you with exceptional heart care, we have created designated Provider Care Teams.  These Care Teams include your primary Cardiologist (physician) and Advanced Practice Providers (APPs -  Physician Assistants and Nurse Practitioners) who all work together to provide you with the care you need, when you need it.  We recommend signing up for the patient portal called "MyChart".  Sign up information is provided on this After Visit Summary.  MyChart is used to connect with patients for Virtual Visits (Telemedicine).  Patients are able to view lab/test results, encounter notes, upcoming appointments, etc.  Non-urgent messages can be sent to your provider as well.   To learn more about what you can do with MyChart, go to [email or web address].    Your next appointment:   Follow up as needed   The format for your next appointment:   In Person  Provider:   You may see Kate Sable, MD or one of the following Advanced Practice Providers on your designated Care Team:   Murray Hodgkins, NP Christell Faith, PA-C Marrianne Mood, PA-C Cadence  [place], [place]   Other Instructions     Signed, Kate Sable, MD  12/06/2020 12:41 PM    [place]

## 2020-12-06 NOTE — Patient Instructions (Signed)
Medication Instructions:  Your physician recommends that you continue on your current medications as directed. Please refer to the Current Medication list given to you today.  *If you need a refill on your cardiac medications before your next appointment, please call your pharmacy*   Lab Work: None ordered If you have labs (blood work) drawn today and your tests are completely normal, you will receive your results only by: [place] (if you have MyChart) OR A paper copy in the mail If you have any lab test that is abnormal or we need to change your treatment, we will call you to review the results.   Testing/Procedures: None ordered   Follow-Up: At [facility], you and your health needs are our priority.  As part of our continuing mission to provide you with exceptional heart care, we have created designated Provider Care Teams.  These Care Teams include your primary Cardiologist (physician) and Advanced Practice Providers (APPs -  Physician Assistants and Nurse Practitioners) who all work together to provide you with the care you need, when you need it.  We recommend signing up for the patient portal called "MyChart".  Sign up information is provided on this After Visit Summary.  MyChart is used to connect with patients for Virtual Visits (Telemedicine).  Patients are able to view lab/test results, encounter notes, upcoming appointments, etc.  Non-urgent messages can be sent to your provider as well.   To learn more about what you can do with MyChart, go to [email or web address].    Your next appointment:   Follow up as needed   The format for your next appointment:   In Person  Provider:   You may see Kate Sable, MD or one of the following Advanced Practice Providers on your designated Care Team:   Murray Hodgkins, NP Christell Faith, PA-C Marrianne Mood, PA-C Cadence Kathlen [place], [place]   Other Instructions

## 2021-04-27 ENCOUNTER — Emergency Department
Admission: EM | Admit: 2021-04-27 | Discharge: 2021-04-27 | Disposition: A | Discharge status: Home / Self Care | Payer: Medicare HMO | Attending: Emergency Medicine | Admitting: Emergency Medicine

## 2021-04-27 ENCOUNTER — Emergency Department: Payer: Medicare HMO

## 2021-04-27 ENCOUNTER — Other Ambulatory Visit: Payer: Self-pay

## 2021-04-27 ENCOUNTER — Inpatient Hospital Stay
Admission: EM | Admit: 2021-04-27 | Discharge: 2021-04-29 | DRG: 312 | Disposition: A | Discharge status: Home / Self Care | Payer: Medicare HMO | Attending: Internal Medicine | Admitting: Internal Medicine

## 2021-04-27 ENCOUNTER — Encounter: Payer: Self-pay | Admitting: Emergency Medicine

## 2021-04-27 DIAGNOSIS — R55 Syncope and collapse: Secondary | ICD-10-CM

## 2021-04-27 DIAGNOSIS — I129 Hypertensive chronic kidney disease with stage 1 through stage 4 chronic kidney disease, or unspecified chronic kidney disease: Secondary | ICD-10-CM | POA: Diagnosis present

## 2021-04-27 DIAGNOSIS — Z79899 Other long term (current) drug therapy: Secondary | ICD-10-CM | POA: Diagnosis not present

## 2021-04-27 DIAGNOSIS — E1165 Type 2 diabetes mellitus with hyperglycemia: Secondary | ICD-10-CM

## 2021-04-27 DIAGNOSIS — R350 Frequency of micturition: Secondary | ICD-10-CM | POA: Diagnosis not present

## 2021-04-27 DIAGNOSIS — E1122 Type 2 diabetes mellitus with diabetic chronic kidney disease: Secondary | ICD-10-CM | POA: Diagnosis present

## 2021-04-27 DIAGNOSIS — Z7984 Long term (current) use of oral hypoglycemic drugs: Secondary | ICD-10-CM

## 2021-04-27 DIAGNOSIS — Z833 Family history of diabetes mellitus: Secondary | ICD-10-CM

## 2021-04-27 DIAGNOSIS — R791 Abnormal coagulation profile: Secondary | ICD-10-CM | POA: Insufficient documentation

## 2021-04-27 DIAGNOSIS — Z8249 Family history of ischemic heart disease and other diseases of the circulatory system: Secondary | ICD-10-CM

## 2021-04-27 DIAGNOSIS — D509 Iron deficiency anemia, unspecified: Secondary | ICD-10-CM | POA: Diagnosis present

## 2021-04-27 DIAGNOSIS — E785 Hyperlipidemia, unspecified: Secondary | ICD-10-CM | POA: Diagnosis present

## 2021-04-27 DIAGNOSIS — F419 Anxiety disorder, unspecified: Secondary | ICD-10-CM | POA: Diagnosis present

## 2021-04-27 DIAGNOSIS — Z794 Long term (current) use of insulin: Secondary | ICD-10-CM | POA: Diagnosis not present

## 2021-04-27 DIAGNOSIS — U071 COVID-19: Secondary | ICD-10-CM

## 2021-04-27 DIAGNOSIS — I10 Essential (primary) hypertension: Secondary | ICD-10-CM | POA: Diagnosis present

## 2021-04-27 DIAGNOSIS — N1832 Chronic kidney disease, stage 3b: Secondary | ICD-10-CM | POA: Diagnosis present

## 2021-04-27 DIAGNOSIS — I951 Orthostatic hypotension: Secondary | ICD-10-CM | POA: Diagnosis present

## 2021-04-27 DIAGNOSIS — K219 Gastro-esophageal reflux disease without esophagitis: Secondary | ICD-10-CM | POA: Diagnosis present

## 2021-04-27 DIAGNOSIS — R509 Fever, unspecified: Secondary | ICD-10-CM | POA: Diagnosis present

## 2021-04-27 DIAGNOSIS — Z5901 Sheltered homelessness: Secondary | ICD-10-CM | POA: Diagnosis not present

## 2021-04-27 DIAGNOSIS — R29818 Other symptoms and signs involving the nervous system: Secondary | ICD-10-CM

## 2021-04-27 DIAGNOSIS — R32 Unspecified urinary incontinence: Secondary | ICD-10-CM | POA: Diagnosis present

## 2021-04-27 LAB — URINE DRUG SCREEN, QUALITATIVE (ARMC ONLY)
Amphetamines, Ur Screen: NOT DETECTED
Barbiturates, Ur Screen: NOT DETECTED
Benzodiazepine, Ur Scrn: NOT DETECTED
Cannabinoid 50 Ng, Ur ~~LOC~~: NOT DETECTED
Cocaine Metabolite,Ur ~~LOC~~: NOT DETECTED
MDMA (Ecstasy)Ur Screen: NOT DETECTED
Methadone Scn, Ur: NOT DETECTED
Opiate, Ur Screen: NOT DETECTED
Phencyclidine (PCP) Ur S: NOT DETECTED
Tricyclic, Ur Screen: NOT DETECTED

## 2021-04-27 LAB — COMPREHENSIVE METABOLIC PANEL
ALT: 20 U/L (ref 0–44)
ALT: 15 U/L (ref 0–44)
AST: 17 U/L (ref 15–41)
AST: 18 U/L (ref 15–41)
Albumin: 4.3 g/dL (ref 3.5–5.0)
Albumin: 3.4 g/dL — ABNORMAL LOW (ref 3.5–5.0)
Alkaline Phosphatase: 82 U/L (ref 38–126)
Alkaline Phosphatase: 69 U/L (ref 38–126)
Anion gap: 10 (ref 5–15)
Anion gap: 6 (ref 5–15)
BUN: 18 mg/dL (ref 8–23)
BUN: 17 mg/dL (ref 8–23)
CO2: 25 mmol/L (ref 22–32)
CO2: 25 mmol/L (ref 22–32)
Calcium: 9.7 mg/dL (ref 8.9–10.3)
Calcium: 8.8 mg/dL — ABNORMAL LOW (ref 8.9–10.3)
Chloride: 102 mmol/L (ref 98–111)
Chloride: 108 mmol/L (ref 98–111)
Creatinine, Ser: 1.24 mg/dL — ABNORMAL HIGH (ref 0.44–1.00)
Creatinine, Ser: 1.36 mg/dL — ABNORMAL HIGH (ref 0.44–1.00)
GFR, Estimated: 48 mL/min — ABNORMAL LOW (ref >60)
GFR, Estimated: 43 mL/min — ABNORMAL LOW (ref >60)
Glucose, Bld: 246 mg/dL — ABNORMAL HIGH (ref 70–99)
Glucose, Bld: 337 mg/dL — ABNORMAL HIGH (ref 70–99)
Potassium: 3.6 mmol/L (ref 3.5–5.1)
Potassium: 4 mmol/L (ref 3.5–5.1)
Sodium: 137 mmol/L (ref 135–145)
Sodium: 139 mmol/L (ref 135–145)
Total Bilirubin: 0.3 mg/dL (ref 0.3–1.2)
Total Bilirubin: 0.5 mg/dL (ref 0.3–1.2)
Total Protein: 8 g/dL (ref 6.5–8.1)
Total Protein: 6.7 g/dL (ref 6.5–8.1)

## 2021-04-27 LAB — URINALYSIS, COMPLETE (UACMP) WITH MICROSCOPIC
Bacteria, UA: NONE SEEN
Bilirubin Urine: NEGATIVE
Glucose, UA: >=500 mg/dL — AB
Hgb urine dipstick: NEGATIVE
Ketones, ur: NEGATIVE mg/dL
Leukocytes,Ua: NEGATIVE
Nitrite: NEGATIVE
Protein, ur: NEGATIVE mg/dL
Specific Gravity, Urine: 1.027 (ref 1.005–1.030)
Squamous Epithelial / HPF: NONE SEEN (ref 0–5)
pH: 6 (ref 5.0–8.0)

## 2021-04-27 LAB — CBC
HCT: 36.2 % (ref 36.0–46.0)
Hemoglobin: 11.6 g/dL — ABNORMAL LOW (ref 12.0–15.0)
MCH: 29.7 pg (ref 26.0–34.0)
MCHC: 32 g/dL (ref 30.0–36.0)
MCV: 92.8 fL (ref 80.0–100.0)
Platelets: 204 10*3/uL (ref 150–400)
RBC: 3.9 MIL/uL (ref 3.87–5.11)
RDW: 11.9 % (ref 11.5–15.5)
WBC: 8 10*3/uL (ref 4.0–10.5)
nRBC: 0 % (ref 0.0–0.2)

## 2021-04-27 LAB — CBC WITH DIFFERENTIAL/PLATELET
Abs Immature Granulocytes: 0.02 10*3/uL (ref 0.00–0.07)
Basophils Absolute: 0 10*3/uL (ref 0.0–0.1)
Basophils Relative: 1 %
Eosinophils Absolute: 0 10*3/uL (ref 0.0–0.5)
Eosinophils Relative: 1 %
HCT: 42.9 % (ref 36.0–46.0)
Hemoglobin: 13.9 g/dL (ref 12.0–15.0)
Immature Granulocytes: 0 %
Lymphocytes Relative: 14 %
Lymphs Abs: 1.2 10*3/uL (ref 0.7–4.0)
MCH: 29.4 pg (ref 26.0–34.0)
MCHC: 32.4 g/dL (ref 30.0–36.0)
MCV: 90.7 fL (ref 80.0–100.0)
Monocytes Absolute: 0.5 10*3/uL (ref 0.1–1.0)
Monocytes Relative: 6 %
Neutro Abs: 6.9 10*3/uL (ref 1.7–7.7)
Neutrophils Relative %: 78 %
Platelets: 248 10*3/uL (ref 150–400)
RBC: 4.73 MIL/uL (ref 3.87–5.11)
RDW: 11.8 % (ref 11.5–15.5)
WBC: 8.8 10*3/uL (ref 4.0–10.5)
nRBC: 0 % (ref 0.0–0.2)

## 2021-04-27 LAB — PROTIME-INR
INR: 0.9 (ref 0.8–1.2)
Prothrombin Time: 12.1 seconds (ref 11.4–15.2)

## 2021-04-27 LAB — URINALYSIS, ROUTINE W REFLEX MICROSCOPIC
Bacteria, UA: NONE SEEN
Bilirubin Urine: NEGATIVE
Glucose, UA: >=500 mg/dL — AB
Hgb urine dipstick: NEGATIVE
Ketones, ur: NEGATIVE mg/dL
Leukocytes,Ua: NEGATIVE
Nitrite: NEGATIVE
Protein, ur: 30 mg/dL — AB
Specific Gravity, Urine: 1.032 — ABNORMAL HIGH (ref 1.005–1.030)
WBC, UA: NONE SEEN WBC/hpf (ref 0–5)
pH: 5 (ref 5.0–8.0)

## 2021-04-27 LAB — RESP PANEL BY RT-PCR (FLU A&B, COVID) ARPGX2
Influenza A by PCR: NEGATIVE
Influenza B by PCR: NEGATIVE
SARS Coronavirus 2 by RT PCR: POSITIVE — AB

## 2021-04-27 LAB — CBG MONITORING, ED
Glucose-Capillary: 324 mg/dL — ABNORMAL HIGH (ref 70–99)
Glucose-Capillary: 251 mg/dL — ABNORMAL HIGH (ref 70–99)

## 2021-04-27 LAB — LACTIC ACID, PLASMA: Lactic Acid, Venous: 1.8 mmol/L (ref 0.5–1.9)

## 2021-04-27 LAB — TROPONIN I (HIGH SENSITIVITY): Troponin I (High Sensitivity): 6 ng/L (ref <18)

## 2021-04-27 MED ORDER — ACETAMINOPHEN 325 MG PO TABS: 650.0000 mg | ORAL_TABLET | Freq: Four times a day (QID) | ORAL | Status: DC | PRN

## 2021-04-27 MED ORDER — SODIUM CHLORIDE 0.9 % IV SOLN
1000.0000 mL | Freq: Once | INTRAVENOUS | Status: AC
  Administered 2021-04-27: 1000 mL via INTRAVENOUS

## 2021-04-27 MED ORDER — ENOXAPARIN SODIUM 40 MG/0.4ML IJ SOSY: 40.0000 mg | PREFILLED_SYRINGE | INTRAMUSCULAR | Status: DC

## 2021-04-27 MED ORDER — SODIUM CHLORIDE 0.9 % IV SOLN: INTRAVENOUS | Status: DC

## 2021-04-27 MED ORDER — ACETAMINOPHEN 650 MG RE SUPP: 650.0000 mg | Freq: Four times a day (QID) | RECTAL | Status: DC | PRN

## 2021-04-27 MED ORDER — ONDANSETRON HCL 4 MG PO TABS: 4.0000 mg | ORAL_TABLET | Freq: Four times a day (QID) | ORAL | Status: DC | PRN

## 2021-04-27 MED ORDER — ASPIRIN 81 MG PO CHEW
324.0000 mg | CHEWABLE_TABLET | Freq: Once | ORAL | Status: AC
  Administered 2021-04-27: 324 mg via ORAL
  Filled 2021-04-27: qty 4

## 2021-04-27 MED ORDER — ASPIRIN 300 MG RE SUPP: 300.0000 mg | Freq: Every day | RECTAL | Status: DC

## 2021-04-27 MED ORDER — ONDANSETRON HCL 4 MG/2ML IJ SOLN: 4.0000 mg | Freq: Four times a day (QID) | INTRAMUSCULAR | Status: DC | PRN

## 2021-04-27 MED ORDER — ENOXAPARIN SODIUM 40 MG/0.4ML IJ SOSY
40.0000 mg | PREFILLED_SYRINGE | INTRAMUSCULAR | Status: DC
  Administered 2021-04-27 – 2021-04-28 (×2): 40 mg via SUBCUTANEOUS
  Filled 2021-04-27 (×2): qty 0.4

## 2021-04-27 MED ORDER — ASPIRIN EC 325 MG PO TBEC: 325.0000 mg | DELAYED_RELEASE_TABLET | Freq: Every day | ORAL | Status: DC

## 2021-04-27 MED ORDER — NIRMATRELVIR/RITONAVIR (PAXLOVID)TABLET: 3.0000 | ORAL_TABLET | Freq: Two times a day (BID) | ORAL | 0 refills | Status: AC

## 2021-04-27 MED ORDER — SODIUM CHLORIDE 0.9 % IV BOLUS
1000.0000 mL | Freq: Once | INTRAVENOUS | Status: AC
  Administered 2021-04-27: 1000 mL via INTRAVENOUS

## 2021-04-27 MED ORDER — SODIUM CHLORIDE 0.9% FLUSH
3.0000 mL | Freq: Two times a day (BID) | INTRAVENOUS | Status: DC
  Administered 2021-04-27 – 2021-04-28 (×3): 3 mL via INTRAVENOUS

## 2021-04-27 MED ORDER — STROKE: EARLY STAGES OF RECOVERY BOOK: Freq: Once | Status: DC

## 2021-04-27 MED ORDER — INSULIN ASPART 100 UNIT/ML IJ SOLN
0.0000 [IU] | INTRAMUSCULAR | Status: DC
  Administered 2021-04-27: 8 [IU] via SUBCUTANEOUS
  Administered 2021-04-28: 2 [IU] via SUBCUTANEOUS
  Administered 2021-04-28: 3 [IU] via SUBCUTANEOUS
  Administered 2021-04-28 (×2): 2 [IU] via SUBCUTANEOUS
  Administered 2021-04-28 – 2021-04-29 (×2): 5 [IU] via SUBCUTANEOUS
  Administered 2021-04-29: 3 [IU] via SUBCUTANEOUS
  Filled 2021-04-27 (×9): qty 1

## 2021-04-27 NOTE — ED Provider Notes (Addendum)
[facility] Provider Note    Event Date/Time   First MD Initiated Contact with Patient 04/27/21 1105     (approximate)   History   Urinary Frequency   HPI  Kelly Doyle is a 66 y.o. female who presents with complaints of urinary frequency, she has had a fever as well.  Symptoms of been present for 2 days.  She also feels fatigued and weak.  No significant cough.  Mild nausea no vomiting has not take anything for this.  No flank pain, no back pain     Physical Exam   Triage Vital Signs: ED Triage Vitals  Enc Vitals Group     BP 04/27/21 0645 (!) 158/98     Pulse Rate 04/27/21 0645 (!) 126     Resp 04/27/21 0645 20     Temp 04/27/21 0645 100.1 F (37.8 C)     Temp Source 04/27/21 0645 Oral     SpO2 04/27/21 0645 97 %     Weight 04/27/21 0651 71.7 kg (158 lb)     Height 04/27/21 0651 1.575 m (5\' 2" )     Head Circumference --      Peak Flow --      Pain Score 04/27/21 0651 0     Pain Loc --      Pain Edu? --      Excl. in [place]? --     Most recent vital signs: Vitals:   04/27/21 1132 04/27/21 1235  BP: (!) 160/97   Pulse: (!) 124 (!) 113  Resp: 20   Temp: 98.6 F (37 C)   SpO2: 96%      General: Awake, no distress.  CV:  Good peripheral perfusion.  Resp:  Normal effort.  Clear to auscultation bilaterally Abd:  No distention.  No CVA tenderness    ED Results / Procedures / Treatments   Labs (all labs ordered are listed, but only abnormal results are displayed) Labs Reviewed  RESP PANEL BY RT-PCR (FLU A&B, COVID) ARPGX2 - Abnormal; Notable for the following components:      Result Value   SARS Coronavirus 2 by RT PCR POSITIVE (*)    All other components within normal limits  COMPREHENSIVE METABOLIC PANEL - Abnormal; Notable for the following components:   Glucose, Bld 246 (*)    Creatinine, Ser 1.24 (*)    GFR, Estimated 48 (*)    All other components within normal limits  URINALYSIS, ROUTINE W REFLEX MICROSCOPIC -  Abnormal; Notable for the following components:   Color, Urine STRAW (*)    APPearance CLEAR (*)    Specific Gravity, Urine 1.032 (*)    Glucose, UA >=500 (*)    Protein, ur 30 (*)    All other components within normal limits  CULTURE, BLOOD (ROUTINE X 2)  CULTURE, BLOOD (ROUTINE X 2)  LACTIC ACID, PLASMA  CBC WITH DIFFERENTIAL/PLATELET  PROTIME-INR     EKG    RADIOLOGY Chest x-ray reviewed by me, no acute abnormality    PROCEDURES:  Critical Care performed:   Procedures   MEDICATIONS ORDERED IN ED: Medications  0.9 %  sodium chloride infusion (0 mLs Intravenous Stopped 04/27/21 1244)     IMPRESSION / MDM / ASSESSMENT AND PLAN / ED COURSE  I reviewed the triage vital signs and the nursing notes.  Patient's presentation is suspicious for urinary tract infection given urinary frequency and fatigue with possible fever  She does report occasional cough but no runny  nose or congestion  Lab work is overall reassuring, CMP is unremarkable, CBC is normal.  Lactic acid is normal.  COVID PCR has returned positive  Patient's urine is reassuring, no evidence of infection.  Patient was treated with IV fluids which improved her heart rate, elevated heart rate likely related to fever/COVID  We will start the patient on paxlovid  Consider admitting the patient because she is homeless however she thinks that she has a place to stay at the shelter and her symptoms at this point are mild she knows she can return anytime to the emergency department         FINAL CLINICAL IMPRESSION(S) / ED DIAGNOSES   Final diagnoses:  COVID-19     Rx / DC Orders   ED Discharge Orders          Ordered    nirmatrelvir/ritonavir EUA (PAXLOVID) 20 x 150 MG & 10 x 100MG  TABS  2 times daily        04/27/21 1243             Note:  This document was prepared using Dragon voice recognition software and may include unintentional dictation errors.   Lavonia Drafts, MD 04/27/21  1259    Lavonia Drafts, MD 04/27/21 1259

## 2021-04-27 NOTE — ED Notes (Signed)
Report received from Olivia,RN

## 2021-04-27 NOTE — ED Notes (Signed)
EDP at bedside  

## 2021-04-27 NOTE — ED Triage Notes (Signed)
Pt reports urinary frequency with fever x 2 days, denies pain with urination

## 2021-04-27 NOTE — ED Notes (Signed)
RN asked me at assist pt to the restroom. Pt could not hold urine and urinated on herself.We were able to get enough for a sample. Provided pt with paper scrubs to change into at discharge and placed pt in a hospital gown. Pt stated that she just "can not control her bladder and does not know why". Pt has call bell in reach. No other needs at this time.

## 2021-04-27 NOTE — ED Provider Notes (Signed)
[facility] Provider Note    Event Date/Time   First MD Initiated Contact with Patient 04/27/21 1600     (approximate)   History   Hyperglycemia and Loss of Consciousness   HPI  Kelly Doyle is a 66 y.o. female with a history of hypertension hyperlipidemia and diabetes on insulin presents to the ER for evaluation of fainting spell that occurred today.  States she was feeling weak and was concerned that her blood sugar had dropped.  Seen earlier and diagnosed with COVID.  Below was feeling well at that time.  She denies any chest pain or pressure at this time.  Does have mild headache.  Has reportedly urinated on herself thinks she may have a urinary tract infection as well.     Physical Exam   Triage Vital Signs: ED Triage Vitals  Enc Vitals Group     BP 04/27/21 1517 138/88     Pulse Rate 04/27/21 1517 (!) 131     Resp 04/27/21 1517 20     Temp 04/27/21 1517 98.7 F (37.1 C)     Temp Source 04/27/21 1517 Oral     SpO2 04/27/21 1517 96 %     Weight 04/27/21 1518 138 lb (62.6 kg)     Height 04/27/21 1518 5\' 2"  (1.575 m)     Head Circumference --      Peak Flow --      Pain Score 04/27/21 1518 0     Pain Loc --      Pain Edu? --      Excl. in [place]? --     Most recent vital signs: Vitals:   04/27/21 1700 04/27/21 2007  BP: (!) 150/72 (!) 157/90  Pulse: (!) 114 (!) 109  Resp:  20  Temp:    SpO2: 98% 97%     Constitutional: Alert  Eyes: Conjunctivae are normal.  Head: Atraumatic. Nose: No congestion/rhinnorhea. Mouth/Throat: Mucous membranes are moist.   Neck: Painless ROM.  Cardiovascular:   Good peripheral circulation. Respiratory: Normal respiratory effort.  No retractions.  Gastrointestinal: Soft and nontender.  Musculoskeletal:  no deformity Neurologic:  MAE spontaneously. No gross focal neurologic deficits are appreciated.  Skin:  Skin is warm, dry and intact. No rash noted. Psychiatric: Mood and affect are normal. Speech and  behavior are normal.    ED Results / Procedures / Treatments   Labs (all labs ordered are listed, but only abnormal results are displayed) Labs Reviewed  CBC - Abnormal; Notable for the following components:      Result Value   Hemoglobin 11.6 (*)    All other components within normal limits  COMPREHENSIVE METABOLIC PANEL - Abnormal; Notable for the following components:   Glucose, Bld 337 (*)    Creatinine, Ser 1.36 (*)    Calcium 8.8 (*)    Albumin 3.4 (*)    GFR, Estimated 43 (*)    All other components within normal limits  URINALYSIS, COMPLETE (UACMP) WITH MICROSCOPIC - Abnormal; Notable for the following components:   Color, Urine STRAW (*)    APPearance CLEAR (*)    Glucose, UA >=500 (*)    All other components within normal limits  CBG MONITORING, ED - Abnormal; Notable for the following components:   Glucose-Capillary 324 (*)    All other components within normal limits  URINE DRUG SCREEN, QUALITATIVE ([facility] ONLY)  TROPONIN I (HIGH SENSITIVITY)  TROPONIN I (HIGH SENSITIVITY)     EKG  ED ECG  REPORT I, Merlyn Lot, the attending physician, personally viewed and interpreted this ECG.   Date: 04/27/2021  EKG Time: 15:19  Rate: 130  Rhythm: sinus  Axis: normal  Intervals:normal intervals  ST&T Change: anterior t wave abn, new compared to previous    RADIOLOGY Please see ED Course for my review and interpretation.  I personally reviewed all radiographic images ordered to evaluate for the above acute complaints and reviewed radiology reports and findings.  These findings were personally discussed with the patient.  Please see medical record for radiology report.    PROCEDURES:  Critical Care performed: No  Procedures   MEDICATIONS ORDERED IN ED: Medications  aspirin chewable tablet 324 mg (has no administration in time range)  sodium chloride 0.9 % bolus 1,000 mL (0 mLs Intravenous Stopped 04/27/21 1854)     IMPRESSION / MDM / ASSESSMENT AND  PLAN / ED COURSE  I reviewed the triage vital signs and the nursing notes.                              Differential diagnosis includes, but is not limited to, dehydration, electrolyte abnormality, anemia, COVID, dysrhythmia, ACS, hypoglycemia, CVA, seizure, PE  Patient presented to ER after syncopal event.  Currently well-appearing able to ambulate into the room in no acute distress currently asymptomatic.  Denies any chest pain but is amnestic to the event.  Blood work be sent for the above differential.  Patient placed on cardiac monitor.  Abdominal exam is soft and benign.  Blood will be sent for the above differential.   Clinical Course as of 04/27/21 2006  Sun Apr 27, 2021  1836 Patient reassessed.  She is feeling improved after IV fluids.  Initial cardiac enzyme is reassuring no significant dehydration.  She is denying any chest pain or shortness of breath have a lower suspicion for PE. [PR]  1953 CT head by my review does not show any evidence of large mass or subdural hematoma.  Per radiology report does have evidence of small possible subacute stroke.  States she is having some weakness in the right side but no objective deficits at this time.  Given her syncope today with COVID with a history of high blood pressure, hyperlipidemia as well as hyperglycemia given her clinical presentation I think that observation the hospital is clinically indicated.  Hospitalist consulted for admission.  I will order MRI to further evaluate.  Will give aspirin. [PR]    Clinical Course User Index [PR] Merlyn Lot, MD     FINAL CLINICAL IMPRESSION(S) / ED DIAGNOSES   Final diagnoses:  Syncope and collapse  COVID-19     Rx / DC Orders   ED Discharge Orders     None        Note:  This document was prepared using Dragon voice recognition software and may include unintentional dictation errors.    Merlyn Lot, MD 04/27/21 2009

## 2021-04-27 NOTE — ED Triage Notes (Signed)
Pt via POV from home.Pt was d/c this AM. Pt had a syncopal episode and PTA, states she think she was out a couple of minutes, initially thought her sugar was low. 324 on arrival. Pt is A&Ox4 and NAD.

## 2021-04-27 NOTE — H&P (Signed)
History and Physical    Kelly Doyle PIR:[id number] DOB: [birth year] DOA: 04/27/2021  PCP: Pcp, No   Patient coming from: home  I have personally briefly reviewed patient's relevant medical records in [place]  Chief Complaint: found unresponsive  HPI: Kelly Doyle is a 66 y.o. female with medical history significant for DM, HTN, brought to the emergency room after she was found unresponsive at this shelter where she resides.  Patient was evaluated earlier in the day in the ED for urinary frequency with work-up significant for positive COVID PCR and she was discharged on Paxlovid.  Later on the same day she briefly passed out as witnessed by her son.  By arrival of EMS she had already started coming around.  She was incontinent of urine.  She is unclear of the circumstances leading to her fall.  She endorses a week of nasal congestion and a mild cough but she denied chest pain, shortness of breath, headache visual disturbance or one-sided numbness weakness or tingling.  She denies cough, fever or chills.  Denies nausea, vomiting, abdominal pain or diarrhea.  ED course: T-max 100.1 and tachycardic to as high as 131 with normal to high blood pressure with SBP 138-157 Blood work: Essentially the same as on her earlier visit and notable for creatinine of 1.36, about baseline but up from 1.24 earlier in the day.   WBC WNL with lactic acid 1.6.   Blood glucose 337.   Hemoglobin 11.6, down from 13.9 earlier, possibly hemodilutional related to earlier treatment Troponin 6 COVID-positive Urinalysis unremarkable  EKG, personally viewed and interpreted: Sinus tachycardia at 127 with nonspecific T wave inversions  CT head: Concerning for 1 cm hypodensity left lentiform nucleus likely old or subacute with recommendation for MRI to rule out acute infarct Chest x-ray clear  MRI brain ordered,   Patient given chewable aspirin.  Hospitalist consulted for admission.   Review of Systems: As  per HPI otherwise all other systems on review of systems negative.   Assessment/Plan    Syncope and collapse -Differential includes acute/subacute CVA given CT head findings, acute metabolic encephalopathy related to COVID, seizure - Neurologic checks, fall and aspiration precautions - EEG to evaluate for seizure - Follow-up MRI to evaluate for acute CVA - Neurology consult  Possible acute CVA - Patient received chewable aspirin - Permissive hypertension, maintain euglycemia and normothermia - Daily aspirin and atorvastatin - Follow-up MRI - Continuous cardiac monitoring, carotid Doppler, echocardiogram - PT OT speech therapy eval - Neurology consult  Sinus tachycardia - Likely secondary to acute illness above, dehydration - We will get TSH, D-dimer - Continuous cardiac monitoring - IV hydration and monitor    COVID-19 virus infection - Remdesivir - COVID precautions    Hyperglycemia due to type 2 diabetes mellitus (HCC) - Sliding scale insulin coverage tonight - Diabetic educator consult    Hypertension - Holding antihypertensives for permissive hyper tensive    Iron deficiency anemia - Hemoglobin 11.6 - Continue to monitor H&H - Anemia panel     Stage 3b chronic kidney disease (HCC) - Appears to be at baseline - Continue to monitor   DVT prophylaxis: Lovenox  Code Status: full code  Family Communication:  none  Disposition Plan: Back to previous home environment Consults called: Neurology Status:At the time of admission, it appears that the appropriate admission status for this patient is INPATIENT. This is judged to be reasonable and necessary in order to provide the required intensity of service to ensure  the patient's safety given the presenting symptoms, physical exam findings, and initial radiographic and laboratory data in the context of their  Comorbid conditions.   Patient requires inpatient status due to high intensity of service, high risk for further  deterioration and high frequency of surveillance required.   I certify that at the point of admission it is my clinical judgment that the patient will require inpatient hospital care spanning beyond 2 midnights     Physical Exam: Vitals:   04/27/21 1517 04/27/21 1518 04/27/21 1700 04/27/21 2007  BP: 138/88  (!) 150/72 (!) 157/90  Pulse: (!) 131  (!) 114 (!) 109  Resp: 20   20  Temp: 98.7 F (37.1 C)     TempSrc: Oral     SpO2: 96%  98% 97%  Weight:  62.6 kg    Height:  5\' 2"  (1.575 m)     Constitutional: Alert, oriented x 3 . Not in any apparent distress HEENT:      Head: Normocephalic and atraumatic.         Eyes: PERLA, EOMI, Conjunctivae are normal. Sclera is non-icteric.       Mouth/Throat: Mucous membranes are moist.       Neck: Supple with no signs of meningismus. Cardiovascular: Regular rate and rhythm. No murmurs, gallops, or rubs. 2+ symmetrical distal pulses are present . No JVD. No  LE edema Respiratory: Respiratory effort normal .Lungs sounds clear bilaterally. No wheezes, crackles, or rhonchi.  Gastrointestinal: Soft, non tender, non distended. Positive bowel sounds.  Genitourinary: No CVA tenderness. Musculoskeletal: Nontender with normal range of motion in all extremities. No cyanosis, or erythema of extremities. Neurologic:  Face is symmetric. Moving all extremities. No gross focal neurologic deficits . Skin: Skin is warm, dry.  No rash or ulcers Psychiatric: Mood and affect are appropriate     Past Medical History:  Diagnosis Date   Anxiety    Arthritis    Diabetes mellitus without complication (HCC)    GERD (gastroesophageal reflux disease)    Hypertension     Past Surgical History:  Procedure Laterality Date   COLONOSCOPY WITH PROPOFOL N/A 06/23/2017   Procedure: COLONOSCOPY WITH PROPOFOL;  Surgeon: Toledo, Benay Pike, MD;  Location: [facility] ENDOSCOPY;  Service: Gastroenterology;  Laterality: N/A;   DILATION AND CURETTAGE OF UTERUS  04/20/1973   NO PAST  SURGERIES     TUBAL LIGATION       reports that she has never smoked. She has never used smokeless tobacco. She reports that she does not drink alcohol and does not use drugs.  No Known Allergies  Family History  Problem Relation Age of Onset   Alzheimer's disease Mother    Hypertension Mother    Diabetes Sister    Gout Brother       Prior to Admission medications   Medication Sig Start Date End Date Taking? Authorizing Provider  acetaminophen (TYLENOL) 650 MG CR tablet SMARTSIG:1-2 Tablet(s) By Mouth Every 8 Hours PRN 07/25/19   [provider]  amLODipine (NORVASC) 10 MG tablet Take 10 mg by mouth daily. 10/27/19   [provider]  atorvastatin (LIPITOR) 40 MG tablet Take 1 tablet (40 mg total) by mouth daily. 07/16/16   Doles-Johnson, Teah, NP  cetirizine (ZYRTEC ALLERGY) 10 MG tablet Take 1 tablet (10 mg total) by mouth daily. 09/13/16   Duanne Guess, PA-C  cyclobenzaprine (FLEXERIL) 10 MG tablet Take 1 tablet (10 mg total) by mouth 3 (three) times daily as needed. Patient not  taking: Reported on 12/06/2020 09/29/16   Sable Feil, PA-C  FLUoxetine (PROZAC) 40 MG capsule Take 40 mg by mouth daily. Patient not taking: Reported on 12/06/2020 08/01/19   [provider]  fluticasone (FLONASE) 50 MCG/ACT nasal spray Place 2 sprays into both nostrils daily. 09/13/16 12/06/20  Duanne Guess, PA-C  hydrochlorothiazide (HYDRODIURIL) 25 MG tablet Take 1 tablet (25 mg total) by mouth daily. 07/16/16   Doles-Johnson, Teah, NP  insulin detemir (LEVEMIR) 100 UNIT/ML injection Inject 0.3 mLs (30 Units total) into the skin daily. Each day that the morning blood sugar is greater than 200, add one unit to the evening insulin detemir  dose 07/16/16   Doles-Johnson, Teah, NP  JARDIANCE 25 MG TABS tablet 25 mg daily.  Patient not taking: Reported on 12/06/2020 09/24/19   [provider]  lisinopril (ZESTRIL) 30 MG tablet Take 1 tablet (30 mg total) by mouth daily. 04/16/20    Loel Dubonnet, NP  loperamide (IMODIUM A-D) 2 MG tablet Take 2 tablets (4 mg total) by mouth 4 (four) times daily as needed for diarrhea or loose stools. Patient not taking: Reported on 12/06/2020 08/24/19   Carrie Mew, MD  loperamide (IMODIUM A-D) 2 MG tablet Take 1 tablet (2 mg total) by mouth 4 (four) times daily as needed for diarrhea or loose stools. Patient not taking: Reported on 12/06/2020 04/10/20   Naaman Plummer, MD  nirmatrelvir/ritonavir EUA (PAXLOVID) 20 x 150 MG & 10 x 100MG  TABS Take 3 tablets by mouth 2 (two) times daily for 5 days. Patient GFR is 60. Take nirmatrelvir (150 mg) two tablets twice daily for 5 days and ritonavir (100 mg) one tablet twice daily for 5 days. 04/27/21 05/02/21  Lavonia Drafts, MD  omeprazole (PRILOSEC) 20 MG capsule Take 20 mg by mouth 2 (two) times daily before a meal.  09/24/19   [provider]  VICTOZA 18 MG/3ML SOPN Inject into the skin. Patient not taking: Reported on 12/06/2020 10/27/19   [provider]      Labs on Admission: I have personally reviewed following labs and imaging studies  CBC: Recent Labs  Lab 04/27/21 0655 04/27/21 1645  WBC 8.8 8.0  NEUTROABS 6.9  --   HGB 13.9 11.6*  HCT 42.9 36.2  MCV 90.7 92.8  PLT 248 466   Basic Metabolic Panel: Recent Labs  Lab 04/27/21 0655 04/27/21 1645  NA 137 139  K 3.6 4.0  CL 102 108  CO2 25 25  GLUCOSE 246* 337*  BUN 18 17  CREATININE 1.24* 1.36*  CALCIUM 9.7 8.8*   GFR: Estimated Creatinine Clearance: 35.4 mL/min (A) (by C-G formula based on SCr of 1.36 mg/dL (H)). Liver Function Tests: Recent Labs  Lab 04/27/21 0655 04/27/21 1645  AST 17 18  ALT 20 15  ALKPHOS 82 69  BILITOT 0.3 0.5  PROT 8.0 6.7  ALBUMIN 4.3 3.4*   No results for input(s): LIPASE, AMYLASE in the last 168 hours. No results for input(s): AMMONIA in the last 168 hours. Coagulation Profile: Recent Labs  Lab 04/27/21 0655  INR 0.9   Cardiac Enzymes: No results for  input(s): CKTOTAL, CKMB, CKMBINDEX, TROPONINI in the last 168 hours. BNP (last 3 results) No results for input(s): PROBNP in the last 8760 hours. HbA1C: No results for input(s): HGBA1C in the last 72 hours. CBG: Recent Labs  Lab 04/27/21 1515  GLUCAP 324*   Lipid Profile: No results for input(s): CHOL, HDL, LDLCALC, TRIG, CHOLHDL, LDLDIRECT in  the last 72 hours. Thyroid Function Tests: No results for input(s): TSH, T4TOTAL, FREET4, T3FREE, THYROIDAB in the last 72 hours. Anemia Panel: No results for input(s): VITAMINB12, FOLATE, FERRITIN, TIBC, IRON, RETICCTPCT in the last 72 hours. Urine analysis:    Component Value Date/Time   COLORURINE STRAW (A) 04/27/2021 1730   APPEARANCEUR CLEAR (A) 04/27/2021 1730   APPEARANCEUR Clear 04/07/2016 1829   LABSPEC 1.027 04/27/2021 1730   PHURINE 6.0 04/27/2021 1730   GLUCOSEU >=500 (A) 04/27/2021 1730   HGBUR NEGATIVE 04/27/2021 1730   BILIRUBINUR NEGATIVE 04/27/2021 1730   BILIRUBINUR Negative 04/07/2016 1829   KETONESUR NEGATIVE 04/27/2021 1730   PROTEINUR NEGATIVE 04/27/2021 1730   NITRITE NEGATIVE 04/27/2021 1730   LEUKOCYTESUR NEGATIVE 04/27/2021 1730    Radiological Exams on Admission: DG Chest 2 View  Result Date: 04/27/2021 CLINICAL DATA:  Suspected sepsis EXAM: CHEST - 2 VIEW COMPARISON:  None. FINDINGS: The lungs are clear and negative for focal airspace consolidation, pulmonary edema or suspicious pulmonary nodule. No pleural effusion or pneumothorax. Cardiac and mediastinal contours are within normal limits. No acute fracture or lytic or blastic osseous lesions. The visualized upper abdominal bowel gas pattern is unremarkable. IMPRESSION: Negative chest x-ray Electronically Signed   By: Jacqulynn Cadet M.D.   On: 04/27/2021 07:28   CT HEAD WO CONTRAST (5MM)  Result Date: 04/27/2021 CLINICAL DATA:  Syncope. EXAM: CT HEAD WITHOUT CONTRAST TECHNIQUE: Contiguous axial images were obtained from the base of the skull through the  vertex without intravenous contrast. COMPARISON:  None. FINDINGS: Brain: The ventricles and sulci are appropriate size for patient's age. Mild periventricular and deep white matter chronic microvascular ischemic changes noted. There is a 1 cm hypodensity in the left lentiform nucleus, likely a subacute or old infarct. Clinical correlation is recommended MRI may provide better evaluation if there is high clinical concern for acute infarct. There is no acute intracranial hemorrhage. No mass effect or midline shift. No extra-axial fluid collection. Vascular: No hyperdense vessel or unexpected calcification. Skull: Normal. Negative for fracture or focal lesion. Sinuses/Orbits: Diffuse mucoperiosteal thickening of paranasal sinuses. No air-fluid level. The mastoid air cells are clear. Other: None IMPRESSION: 1. No acute intracranial hemorrhage. 2. A 1 cm hypodensity in the left lentiform nucleus, likely old or subacute. MRI may provide better evaluation if there is high clinical concern for acute infarct. Electronically Signed   By: Anner Crete M.D.   On: 04/27/2021 19:24   DG Chest Portable 1 View  Result Date: 04/27/2021 CLINICAL DATA:  Recent syncopal episode a couple minuets ago, tested positive for COVID earlier today, CGB 324. Arrived to hospital for complaints of urinary frequency and fevers x 2 days. No known heart or lung conditions. Nonsmoker. EXAM: PORTABLE CHEST 1 VIEW COMPARISON:  04/27/2021. FINDINGS: Cardiac silhouette normal in size. Normal mediastinal and hilar contours. Clear lungs.  No pleural effusion or pneumothorax. Skeletal structures are grossly intact. IMPRESSION: No active disease. Electronically Signed   By: Lajean Manes M.D.   On: 04/27/2021 16:26       Athena Masse MD [facility]   04/27/2021, 8:48 PM

## 2021-04-27 NOTE — ED Provider Notes (Signed)
Emergency Medicine Provider Triage Evaluation Note  Kelly Doyle , a 66 y.o. female  was evaluated in triage after [facility] was [facility] out at [facility] shelter.  Patient states that she feels weak.  Patient was seen and evaluated earlier today.  Review of Systems  Positive: Patient has cough, congestion and weakness.  Negative: No chest pain.   Physical Exam  There were no vitals taken for this visit. Gen:   Awake, no distress   Resp:  Normal effort  MSK:   Moves extremities without difficulty    Medical Decision Making  Medically screening exam initiated at 3:14 PM.  Appropriate orders placed.  Kelly Doyle was informed that the remainder of the evaluation will be completed by another provider, this initial triage assessment does not replace that evaluation, and the importance of remaining in the ED until their evaluation is complete.     [name], PA-C 04/27/21 1522    [name], MD 04/28/21 [phone]

## 2021-04-28 ENCOUNTER — Inpatient Hospital Stay: Payer: Medicare HMO

## 2021-04-28 ENCOUNTER — Inpatient Hospital Stay (HOSPITAL_COMMUNITY)
Admit: 2021-04-28 | Discharge: 2021-04-28 | Disposition: A | Discharge status: Home / Self Care | Payer: Medicare HMO | Attending: Internal Medicine | Admitting: Internal Medicine

## 2021-04-28 DIAGNOSIS — R55 Syncope and collapse: Secondary | ICD-10-CM

## 2021-04-28 LAB — LIPID PANEL
Cholesterol: 143 mg/dL (ref 0–200)
HDL: 62 mg/dL (ref >40)
LDL Cholesterol: 67 mg/dL (ref 0–99)
Total CHOL/HDL Ratio: 2.3 RATIO
Triglycerides: 71 mg/dL (ref <150)
VLDL: 14 mg/dL (ref 0–40)

## 2021-04-28 LAB — ECHOCARDIOGRAM COMPLETE
AR max vel: 1.58 cm2
AV Area VTI: 1.51 cm2
AV Area mean vel: 1.45 cm2
AV Mean grad: 4 mmHg
AV Peak grad: 6 mmHg
Ao pk vel: 1.22 m/s
Area-P 1/2: 6.71 cm2
Height: 62 in
MV VTI: 1.69 cm2
S' Lateral: 2.58 cm
Weight: 2208 oz

## 2021-04-28 LAB — CBC
HCT: 37.9 % (ref 36.0–46.0)
Hemoglobin: 12.1 g/dL (ref 12.0–15.0)
MCH: 29.2 pg (ref 26.0–34.0)
MCHC: 31.9 g/dL (ref 30.0–36.0)
MCV: 91.5 fL (ref 80.0–100.0)
Platelets: 176 10*3/uL (ref 150–400)
RBC: 4.14 MIL/uL (ref 3.87–5.11)
RDW: 11.8 % (ref 11.5–15.5)
WBC: 7.3 10*3/uL (ref 4.0–10.5)
nRBC: 0 % (ref 0.0–0.2)

## 2021-04-28 LAB — BASIC METABOLIC PANEL
Anion gap: 7 (ref 5–15)
BUN: 14 mg/dL (ref 8–23)
CO2: 25 mmol/L (ref 22–32)
Calcium: 8.9 mg/dL (ref 8.9–10.3)
Chloride: 107 mmol/L (ref 98–111)
Creatinine, Ser: 0.97 mg/dL (ref 0.44–1.00)
GFR, Estimated: >60 mL/min (ref >60)
Glucose, Bld: 155 mg/dL — ABNORMAL HIGH (ref 70–99)
Potassium: 3.8 mmol/L (ref 3.5–5.1)
Sodium: 139 mmol/L (ref 135–145)

## 2021-04-28 LAB — CBG MONITORING, ED
Glucose-Capillary: 134 mg/dL — ABNORMAL HIGH (ref 70–99)
Glucose-Capillary: 135 mg/dL — ABNORMAL HIGH (ref 70–99)
Glucose-Capillary: 161 mg/dL — ABNORMAL HIGH (ref 70–99)
Glucose-Capillary: 184 mg/dL — ABNORMAL HIGH (ref 70–99)
Glucose-Capillary: 258 mg/dL — ABNORMAL HIGH (ref 70–99)

## 2021-04-28 LAB — D-DIMER, QUANTITATIVE: D-Dimer, Quant: 0.5 ug/mL-FEU (ref 0.00–0.50)

## 2021-04-28 LAB — HEMOGLOBIN A1C
Hgb A1c MFr Bld: 8.4 % — ABNORMAL HIGH (ref 4.8–5.6)
Mean Plasma Glucose: 194.38 mg/dL

## 2021-04-28 LAB — FERRITIN: Ferritin: 98 ng/mL (ref 11–307)

## 2021-04-28 LAB — IRON AND TIBC
Iron: 26 ug/dL — ABNORMAL LOW (ref 28–170)
Saturation Ratios: 10 % — ABNORMAL LOW (ref 10.4–31.8)
TIBC: 266 ug/dL (ref 250–450)
UIBC: 240 ug/dL

## 2021-04-28 LAB — TSH: TSH: 2.026 u[IU]/mL (ref 0.350–4.500)

## 2021-04-28 LAB — RETICULOCYTES
Immature Retic Fract: 7.3 % (ref 2.3–15.9)
RBC.: 3.79 MIL/uL — ABNORMAL LOW (ref 3.87–5.11)
Retic Count, Absolute: 69.4 10*3/uL (ref 19.0–186.0)
Retic Ct Pct: 1.8 % (ref 0.4–3.1)

## 2021-04-28 LAB — FOLATE: Folate: 9.8 ng/mL (ref >5.9)

## 2021-04-28 LAB — C-REACTIVE PROTEIN: CRP: 4.1 mg/dL — ABNORMAL HIGH (ref <1.0)

## 2021-04-28 LAB — VITAMIN B12: Vitamin B-12: 209 pg/mL (ref 180–914)

## 2021-04-28 LAB — HIV ANTIBODY (ROUTINE TESTING W REFLEX): HIV Screen 4th Generation wRfx: NONREACTIVE

## 2021-04-28 LAB — TROPONIN I (HIGH SENSITIVITY): Troponin I (High Sensitivity): 8 ng/L (ref <18)

## 2021-04-28 MED ORDER — SODIUM CHLORIDE 0.9 % IV SOLN: INTRAVENOUS | Status: DC

## 2021-04-28 MED ORDER — INSULIN DETEMIR 100 UNIT/ML ~~LOC~~ SOLN
10.0000 [IU] | Freq: Every day | SUBCUTANEOUS | Status: DC
  Administered 2021-04-28 – 2021-04-29 (×2): 10 [IU] via SUBCUTANEOUS
  Filled 2021-04-28 (×2): qty 0.1

## 2021-04-28 MED ORDER — FERROUS SULFATE 325 (65 FE) MG PO TABS
325.0000 mg | ORAL_TABLET | Freq: Every day | ORAL | Status: DC
  Administered 2021-04-29: 325 mg via ORAL
  Filled 2021-04-28: qty 1

## 2021-04-28 MED ORDER — ACETAMINOPHEN 325 MG PO TABS
650.0000 mg | ORAL_TABLET | Freq: Four times a day (QID) | ORAL | Status: DC | PRN
  Administered 2021-04-28: 650 mg via ORAL
  Filled 2021-04-28: qty 2

## 2021-04-28 NOTE — ED Notes (Addendum)
Spoke with patient's daughter, Aldona Bar, and provided an update. [phone] Provided patient with daughter's new number

## 2021-04-28 NOTE — ED Notes (Signed)
Patient ambulated to the restroom, steady gait noted.

## 2021-04-28 NOTE — ED Notes (Signed)
Pt care taken, pt alert and oriented, cleaned bed from urine. No other complaints at this time

## 2021-04-28 NOTE — Progress Notes (Signed)
WithBrief same-day progress note  Patient is a 66 year old female with history of diabetes type 2, hypertension who presented to the emergency department after she was found to be unresponsive at her shelter.  Patient was evaluated in the emergency department for urinary frequency earlier, found to be COVID-positive and was discharged on Paxlovid.  Later on the same day, she briefly passed out as witnessed by her son.  On presentation to the emergency departmentShe was tachycardic, mildly febrile, hypertensive.  COVID screen testing mildly positive.  MRI of the brain did not show any acute intracranial abnormalities.  Patient was admitted for syncopal work-up. Patient seen and examined the bedside this morning.  During my evaluation she was hemodynamically stable.  Completely alert and oriented.  Does not have any focal neurodeficits.  No problem with ambulation. She was seen by PT and was found to be orthostatic. Orthostatic hypotension could be the etiology for syncopal.  Her echocardiogram showed EF of 55 to 86%, grade 1 diastolic dysfunction, no valvular abnormalities. Carotid Dopplers pending.  Admitting physician also ordered EEG. Level showed low iron but normal hemoglobin.  Started on oral iron supplementation.  Her chest x-ray did not show pneumonia, she is not hypoxic so there is no indication for treatment for COVID. Continue current insulin regimen for diabetes. Will continue IV fluids for today.  We will check orthostatic vitals tomorrow.  Potential discharge tomorrow

## 2021-04-28 NOTE — Evaluation (Signed)
Physical Therapy Evaluation Patient Details Name: Kelly Doyle MRN: [id number] DOB: [birth year] Today's Date: 04/28/2021  History of Present Illness  presented to ER after being found unresponsive; admitted for work up of syncope and collapse.  TIA/CVA work up largely unrevealing; imaging negative for acute infarct.  Noted to by COVID-19 positive.  Clinical Impression  Patient resting on stretcher upon arrival to room; son at bedside.  Alert and oriented, follows commands and agreeable to participation with treatment session.  Bilat UE/LE strength and ROM grossly symmetrical and WFL; no focal weakness, sensory or coordination deficit appreciated.  Able to complete bed mobility with close sup/cga; sit/stand, basic transfers and gait (50') without assist device, close sup.  Demonstrates reciprocal stepping with fair step height/lenght; good trunk rotation and arm swing.  Negotiate turns, obstacles without difficulty.  Endorses gait performance is near baseline for her. Does demonstrate mild orthostasis with transition to upright (see vitals flowsheet for details).  Educated in awareness of orthostatic hypotension, signs/symptoms, strategies for management and safety with transfers/gait.  Patient voiced understanding.  Will reinforce as needed.  RN/MD informed/aware. Would benefit from skilled PT to address above deficits and promote optimal return to PLOF.  Will continue to see while hospitalized to ensure continued mobilization and full resolution of orthostasis.  Anticipate no formal PT needs upon discharge.     Supine: BP 165/60, HR 113 Sitting: BP 150/57, HR 129 Standing/after gait: BP 128/72, HR 139   Recommendations for follow up therapy are one component of a multi-disciplinary discharge planning process, led by the attending physician.  Recommendations may be updated based on patient status, additional functional criteria and insurance authorization.  Follow Up Recommendations No PT follow  up    Assistance Recommended at Discharge PRN  Patient can return home with the following       Equipment Recommendations    Recommendations for Other Services       Functional Status Assessment Patient has had a recent decline in their functional status and demonstrates the ability to make significant improvements in function in a reasonable and predictable amount of time.     Precautions / Restrictions Precautions Precautions: Fall Restrictions Weight Bearing Restrictions: No      Mobility  Bed Mobility Overal bed mobility: Modified Independent                  Transfers Overall transfer level: Needs assistance Equipment used: None Transfers: Sit to/from Stand Sit to Stand: Supervision           General transfer comment: good LE strength, control    Ambulation/Gait Ambulation/Gait assistance: Supervision Gait Distance (Feet): 50 Feet Assistive device: None         General Gait Details: reciprocal stepping with fair step height/lenght; good trunk rotation and arm swing.  Negotiate turns, obstacles without difficulty.  Endorses gait performance is near baseline for her.  Stairs            Wheelchair Mobility    Modified Rankin (Stroke Patients Only)       Balance Overall balance assessment: Needs assistance Sitting-balance support: No upper extremity supported;Feet supported Sitting balance-Leahy Scale: Normal     Standing balance support: No upper extremity supported Standing balance-Leahy Scale: Good                               Pertinent Vitals/Pain Pain Assessment: No/denies pain    Home Living Family/patient expects to be  discharged to:: Shelter/Homeless               Home Layout: One level Home Equipment: None      Prior Function Prior Level of Function : Independent/Modified Independent             Mobility Comments: Indep with ADLs, household and community mobilization upon discharge; no fall  history.       Hand Dominance        Extremity/Trunk Assessment   Upper Extremity Assessment Upper Extremity Assessment: Overall WFL for tasks assessed    Lower Extremity Assessment Lower Extremity Assessment: Overall WFL for tasks assessed (grossly 4 to 4+/5 throughout; no focal weakness, sensory or coordination deficit)       Communication   Communication: No difficulties  Cognition Arousal/Alertness: Awake/alert Behavior During Therapy: WFL for tasks assessed/performed Overall Cognitive Status: Within Functional Limits for tasks assessed                                          General Comments      Exercises Other Exercises Other Exercises: Reveiwed role of PT and progressive mobility; educated re: orthostatic hypotension--signs/symptoms, strategies to manage, safety with mobility.  Patient voiced understanding of all information.   Assessment/Plan    PT Assessment Patient needs continued PT services  PT Problem List Decreased activity tolerance;Decreased balance;Decreased mobility       PT Treatment Interventions DME instruction;Gait training;Stair training;Functional mobility training;Therapeutic activities;Therapeutic exercise;Balance training;Patient/family education    PT Goals (Current goals can be found in the Care Plan section)  Acute Rehab PT Goals Patient Stated Goal: to discharge from hospital and feel better PT Goal Formulation: With patient/family Time For Goal Achievement: 05/12/21 Potential to Achieve Goals: Good    Frequency Min 2X/week     Co-evaluation               AM-PAC PT "6 Clicks" Mobility  Outcome Measure Help needed turning from your back to your side while in a flat bed without using bedrails?: None Help needed moving from lying on your back to sitting on the side of a flat bed without using bedrails?: None Help needed moving to and from a bed to a chair (including a wheelchair)?: None Help needed  standing up from a chair using your arms (e.g., wheelchair or bedside chair)?: None Help needed to walk in hospital room?: None Help needed climbing 3-5 steps with a railing? : A Little 6 Click Score: 23    End of Session Equipment Utilized During Treatment: Gait belt Activity Tolerance: Patient tolerated treatment well Patient left: in bed;with call bell/phone within reach;with family/visitor present Nurse Communication: Mobility status PT Visit Diagnosis: Muscle weakness (generalized) (M62.81);Difficulty in walking, not elsewhere classified (R26.2)    Time: 1020-1043 PT Time Calculation (min) (ACUTE ONLY): 23 min   Charges:   PT Evaluation $PT Eval Moderate Complexity: 1 Mod PT Treatments $Therapeutic Activity: 8-22 mins      Jezreel Justiniano H. Owens Shark, PT, DPT, NCS 04/28/21, 11:02 AM [phone]

## 2021-04-28 NOTE — Progress Notes (Signed)
Inpatient Diabetes Program Recommendations  AACE/ADA: New Consensus Statement on Inpatient Glycemic Control   Target Ranges:  Prepandial:   less than 140 mg/dL      Peak postprandial:   less than 180 mg/dL (1-2 hours)      Critically ill patients:  140 - 180 mg/dL    Latest Reference Range & Units 04/28/21 00:15 04/28/21 07:52 04/28/21 11:44  Glucose-Capillary 70 - 99 mg/dL 134 (H) 161 (H) 258 (H)    Latest Reference Range & Units 04/27/21 15:15 04/27/21 21:45  Glucose-Capillary 70 - 99 mg/dL 324 (H) 251 (H)    Latest Reference Range & Units 07/09/16 18:59 04/28/21 00:26  Hemoglobin A1C 4.8 - 5.6 % 10.1 (H) 8.4 (H)   Review of Glycemic Control  Diabetes history: DM2 Outpatient Diabetes medications: Levemir 30 units daily, Jardiance 25 mg daily, Victoza daily (per med list not taking Victoza) Current orders for Inpatient glycemic control: Novolog 0-15 units Q4H  Inpatient Diabetes Program Recommendations:    Insulin: If patient is eating well, please consider changing CBGs to AC&HS with Novolog 0-15 units AC&HS. Also, please consider ordering Levemir 10 units Q24H.  Diet: Please add Carb Modified to Heart Healthy diet.  Thanks, Barnie Alderman, RN, MSN, CDE Diabetes Coordinator Inpatient Diabetes Program [phone] (Team Pager from 8am to 5pm)

## 2021-04-28 NOTE — ED Notes (Signed)
Patient ambulate to the restroom without assistance, steady gait noted

## 2021-04-28 NOTE — Progress Notes (Signed)
SLP Cancellation Note  Patient Details Name: Kelly Doyle MRN: [id number] DOB: Aug 24, 1955   Cancelled treatment:       Reason Eval/Treat Not Completed: SLP screened, no needs identified, will sign off .   SLP consult received and appreciated. Chart review completed. Per discussions with PT and RN as well as chart review, pt no speech/language/cognitive or swallowing needs. CVA has been ruled out. SLP to sign off at this time. Please re-consult should pt benefit from skilled SLP services.   RN aware of the above.  Cherrie Gauze, M.S., [facility] [phone] [place])   Quintella Baton 04/28/2021, 11:04 AM

## 2021-04-28 NOTE — Procedures (Signed)
Patient Name: Kelly Doyle  MRN: [id number]  Epilepsy Attending: Lora Havens  Referring Physician/Provider: Athena Masse, MD Date: 04/28/2021 Duration: 30.37 mins  Patient history: 66yo F with syncope. EEG to evaluate for seizure.  Level of alertness: Awake, asleep  AEDs during EEG study: None  Technical aspects: This EEG study was done with scalp electrodes positioned according to the 10-20 International system of electrode placement. Electrical activity was acquired at a sampling rate of 500Hz  and reviewed with a high frequency filter of 70Hz  and a low frequency filter of 1Hz . EEG data were recorded continuously and digitally stored.   Description: The posterior dominant rhythm consists of 9 Hz activity of moderate voltage (25-35 uV) seen predominantly in posterior head regions, symmetric and reactive to eye opening and eye closing. Sleep was characterized by vertex waves, sleep spindles (12 to 14 Hz), maximal frontocentral region. Hyperventilation and photic stimulation were not performed.     IMPRESSION: This study is within normal limits. No seizures or epileptiform discharges were seen throughout the recording.  Jacqualyn Sedgwick Barbra Sarks

## 2021-04-28 NOTE — TOC Progression Note (Signed)
Transition of Care [place] [facility]) - Progression Note    Patient Details  Name: Kelly Doyle MRN: [id number] Date of Birth: [birth year]  Transition of Care [facility]) CM/SW Contact  Shelbie Hutching, RN Phone Number: 04/28/2021, 5:10 PM  Clinical Narrative:    [facility] consult for patient to go to the shelter at discharge.  Patient is COVID positive and will not be able to discharge to the shelter.  RNCM will reach out to shelters to see what their quarantine period is.          Expected Discharge Plan and Services                                                 Social Determinants of Health (SDOH) Interventions    Readmission Risk Interventions No flowsheet data found.

## 2021-04-28 NOTE — Progress Notes (Signed)
Eeg done 

## 2021-04-28 NOTE — Progress Notes (Signed)
OT Cancellation Note  Patient Details Name: Kelly Doyle MRN: [id number] DOB: 1956-02-01   Cancelled Treatment:    Reason Eval/Treat Not Completed: OT screened, no needs identified, will sign off. Order received and chart reviewed. Per conversation with RN and PT, pt near baseline functional mobility and no assist for ADLs at this time.   Dessie Coma, M.S. OTR/L  04/28/21, 11:47 AM  ascom [phone]

## 2021-04-28 NOTE — ED Notes (Signed)
Lab at the bedside 

## 2021-04-28 NOTE — ED Notes (Signed)
Patient requested to not be woken up for the 0400 BGL, MD aware

## 2021-04-28 NOTE — Progress Notes (Signed)
*  PRELIMINARY RESULTS* Echocardiogram 2D Echocardiogram has been performed.  Kelly Doyle 04/28/2021, 9:40 AM

## 2021-04-29 DIAGNOSIS — R55 Syncope and collapse: Secondary | ICD-10-CM | POA: Diagnosis not present

## 2021-04-29 LAB — CBG MONITORING, ED
Glucose-Capillary: 111 mg/dL — ABNORMAL HIGH (ref 70–99)
Glucose-Capillary: 111 mg/dL — ABNORMAL HIGH (ref 70–99)
Glucose-Capillary: 154 mg/dL — ABNORMAL HIGH (ref 70–99)
Glucose-Capillary: 239 mg/dL — ABNORMAL HIGH (ref 70–99)

## 2021-04-29 MED ORDER — FERROUS SULFATE 325 (65 FE) MG PO TABS: 325.0000 mg | ORAL_TABLET | Freq: Every day | ORAL | 1 refills | Status: DC

## 2021-04-29 NOTE — Discharge Summary (Signed)
Physician Discharge Summary  Ariana Juul Tomasello OYD:[id number] DOB: 09-02-1955 DOA: 04/27/2021  PCP: Pcp, No  Admit date: 04/27/2021 Discharge date: 04/29/2021  Admitted From: Home Disposition:  Home  Discharge Condition:Stable CODE STATUS:FULL, DNR, Comfort Care Diet recommendation: Heart Healthy / Carb Modified / Regular / Dysphagia   Brief/Interim Summary: Patient is a 66 year old female with history of diabetes type 2, hypertension who presented to the emergency department after she was found to be unresponsive at her shelter.  Patient was evaluated in the emergency department for urinary frequency earlier, found to be COVID-positive and was discharged on Paxlovid.  Later on the same day, she briefly passed out as witnessed by her son.  On presentation to the emergency department,she was tachycardic, mildly febrile, hypertensive.  COVID screen testing mildly positive.  MRI of the brain did not show any acute intracranial abnormalities.  Patient was admitted for syncopal work-up.She was seen by PT and was found to be orthostatic. Orthostatic hypotension could be the etiology for syncopal.  Her echocardiogram showed EF of 55 to 67%, grade 1 diastolic dysfunction, no valvular abnormalities. Carotid Dopplers did not show significant stenosis, EEG negative for seizures.  Currently she is alert and oriented, hemodynamically stable for discharge today.  Following problems were addressed during her hospitalization:  1) syncopal episode: Most likely from orthostatic hypotension.  Treated with IV fluids.  She is still mildly orthostatic this morning but she is completely symptom-free, no dizziness or lightheadedness upon standing or walking.  PT evaluated her, no follow-up recommended. Echo as above, carotid Doppler without any significant stenosis.  EEG did not show any seizure. MRI did not show any acute intracranial abnormalities.  2) iron-deficiency Level showed low iron but normal hemoglobin.   Started on oral iron supplementation.    3) COVID infection  her chest x-ray did not show pneumonia, she is not hypoxic so there is no indication for treatment for COVID.  She was prescribed Paxlovid when she presented to ER earlier, she can continue taking that  4) hypertension: Blood pressure currently in normal range to mildly high.  She takes amlodipine, lisinopril, hydrochlorothiazide at home.  Hydrochlorothiazide will be discontinued.  She needs to be cautious while taking antihypertensives and needs to avoid taking them if her blood pressure stable.  5) diabetes type 2 Hemoglobin A1c in the range of 8.  Continue home regimen on discharge.   Discharge Diagnoses:  Principal Problem:   Syncope and collapse Active Problems:   Hypertension   Iron deficiency anemia   Stage 3b chronic kidney disease ([place])   COVID-19 virus infection   Hyperglycemia due to type 2 diabetes mellitus [facility])    Discharge Instructions  Discharge Instructions     Diet - low sodium heart healthy   Complete by: As directed    Discharge instructions   Complete by: As directed    1)Please follow up with your PCP in a week 2)Monitor your blood pressure and blood sugars at home.If your blood pressure is low,donot take the blood pressure pills   Increase activity slowly   Complete by: As directed       Allergies as of 04/29/2021   No Known Allergies      Medication List     STOP taking these medications    cyclobenzaprine 10 MG tablet Commonly known as: FLEXERIL   fluticasone 50 MCG/ACT nasal spray Commonly known as: Flonase   hydrochlorothiazide 25 MG tablet Commonly known as: HYDRODIURIL   loperamide 2 MG tablet Commonly known  as: IMODIUM A-D       TAKE these medications    acetaminophen 650 MG CR tablet Commonly known as: TYLENOL SMARTSIG:1-2 Tablet(s) By Mouth Every 8 Hours PRN   amLODipine 10 MG tablet Commonly known as: NORVASC Take 10 mg by mouth daily.   atorvastatin  40 MG tablet Commonly known as: LIPITOR Take 1 tablet (40 mg total) by mouth daily.   cetirizine 10 MG tablet Commonly known as: ZyrTEC Allergy Take 1 tablet (10 mg total) by mouth daily.   ferrous sulfate 325 (65 FE) MG tablet Take 1 tablet (325 mg total) by mouth daily with breakfast. Start taking on: April 30, 2021   FLUoxetine 40 MG capsule Commonly known as: PROZAC Take 40 mg by mouth daily.   hydrOXYzine 25 MG tablet Commonly known as: ATARAX Take 25 mg by mouth daily.   insulin detemir 100 UNIT/ML injection Commonly known as: LEVEMIR Inject 0.3 mLs (30 Units total) into the skin daily. Each day that the morning blood sugar is greater than 200, add one unit to the evening insulin detemir  dose   Jardiance 25 MG Tabs tablet Generic drug: empagliflozin 25 mg daily.   lisinopril 40 MG tablet Commonly known as: ZESTRIL Take 40 mg by mouth daily. What changed: Another medication with the same name was removed. Continue taking this medication, and follow the directions you see here.   nirmatrelvir/ritonavir EUA 20 x 150 MG & 10 x 100MG  Tabs Commonly known as: PAXLOVID Take 3 tablets by mouth 2 (two) times daily for 5 days. Patient GFR is 60. Take nirmatrelvir (150 mg) two tablets twice daily for 5 days and ritonavir (100 mg) one tablet twice daily for 5 days.   omeprazole 20 MG capsule Commonly known as: PRILOSEC Take 20 mg by mouth 2 (two) times daily before a meal.   venlafaxine 37.5 MG tablet Commonly known as: EFFEXOR Take 1 tablet by mouth daily.   Victoza 18 MG/3ML Sopn Generic drug: liraglutide Inject into the skin.        Follow-up Information     Kate Sable, MD. Schedule an appointment as soon as possible for a visit in 1 week(s).   Specialties: Cardiology, Radiology Contact information: [place] [address] [phone]                No Known Allergies  Consultations: None   Procedures/Studies: DG  Chest 2 View  Result Date: 04/27/2021 CLINICAL DATA:  Suspected sepsis EXAM: CHEST - 2 VIEW COMPARISON:  None. FINDINGS: The lungs are clear and negative for focal airspace consolidation, pulmonary edema or suspicious pulmonary nodule. No pleural effusion or pneumothorax. Cardiac and mediastinal contours are within normal limits. No acute fracture or lytic or blastic osseous lesions. The visualized upper abdominal bowel gas pattern is unremarkable. IMPRESSION: Negative chest x-ray Electronically Signed   By: Jacqulynn Cadet M.D.   On: 04/27/2021 07:28   CT HEAD WO CONTRAST (5MM)  Result Date: 04/27/2021 CLINICAL DATA:  Syncope. EXAM: CT HEAD WITHOUT CONTRAST TECHNIQUE: Contiguous axial images were obtained from the base of the skull through the vertex without intravenous contrast. COMPARISON:  None. FINDINGS: Brain: The ventricles and sulci are appropriate size for patient's age. Mild periventricular and deep white matter chronic microvascular ischemic changes noted. There is a 1 cm hypodensity in the left lentiform nucleus, likely a subacute or old infarct. Clinical correlation is recommended MRI may provide better evaluation if there is high clinical concern for acute infarct. There  is no acute intracranial hemorrhage. No mass effect or midline shift. No extra-axial fluid collection. Vascular: No hyperdense vessel or unexpected calcification. Skull: Normal. Negative for fracture or focal lesion. Sinuses/Orbits: Diffuse mucoperiosteal thickening of paranasal sinuses. No air-fluid level. The mastoid air cells are clear. Other: None IMPRESSION: 1. No acute intracranial hemorrhage. 2. A 1 cm hypodensity in the left lentiform nucleus, likely old or subacute. MRI may provide better evaluation if there is high clinical concern for acute infarct. Electronically Signed   By: Anner Crete M.D.   On: 04/27/2021 19:24   MR ANGIO HEAD WO CONTRAST  Result Date: 04/27/2021 CLINICAL DATA:  Acute neurologic deficit  EXAM: MRI HEAD WITHOUT CONTRAST MRA HEAD WITHOUT CONTRAST TECHNIQUE: Multiplanar, multi-echo pulse sequences of the brain and surrounding structures were acquired without intravenous contrast. Angiographic images of the Circle of Willis were acquired using MRA technique without intravenous contrast. COMPARISON:  No pertinent prior exam. FINDINGS: MRI HEAD FINDINGS Brain: No acute infarct, mass effect or extra-axial collection. No acute or chronic hemorrhage. There is multifocal hyperintense T2-weighted signal within the white matter. Parenchymal volume and CSF spaces are normal. Dilated perivascular space in the left lentiform nucleus the midline structures are normal. Vascular: Major flow voids are preserved. Skull and upper cervical spine: Normal calvarium and skull base. Visualized upper cervical spine and soft tissues are normal. Sinuses/Orbits:No paranasal sinus fluid levels or advanced mucosal thickening. No mastoid or middle ear effusion. Normal orbits. MRA HEAD FINDINGS POSTERIOR CIRCULATION: --Vertebral arteries: Normal --Inferior cerebellar arteries: Normal. --Basilar artery: Normal. --Superior cerebellar arteries: Normal. --Posterior cerebral arteries: Multifocal irregularity, possibly exaggerated by motion ANTERIOR CIRCULATION: --Intracranial internal carotid arteries: Normal. --Anterior cerebral arteries (ACA): Normal. --Middle cerebral arteries (MCA): Multifocal irregularity, greatest at the origin of the right M1 segment. This may be somewhat exaggerated by motion ANATOMIC VARIANTS: None IMPRESSION: 1. No acute intracranial abnormality. 2. Multifocal irregularity, greatest at the origin of the right MCA M1 segment, which may be somewhat exaggerated by motion artifact. Electronically Signed   By: Ulyses Jarred M.D.   On: 04/27/2021 21:02   MR BRAIN WO CONTRAST  Result Date: 04/27/2021 CLINICAL DATA:  Acute neurologic deficit EXAM: MRI HEAD WITHOUT CONTRAST MRA HEAD WITHOUT CONTRAST TECHNIQUE:  Multiplanar, multi-echo pulse sequences of the brain and surrounding structures were acquired without intravenous contrast. Angiographic images of the Circle of Willis were acquired using MRA technique without intravenous contrast. COMPARISON:  No pertinent prior exam. FINDINGS: MRI HEAD FINDINGS Brain: No acute infarct, mass effect or extra-axial collection. No acute or chronic hemorrhage. There is multifocal hyperintense T2-weighted signal within the white matter. Parenchymal volume and CSF spaces are normal. Dilated perivascular space in the left lentiform nucleus the midline structures are normal. Vascular: Major flow voids are preserved. Skull and upper cervical spine: Normal calvarium and skull base. Visualized upper cervical spine and soft tissues are normal. Sinuses/Orbits:No paranasal sinus fluid levels or advanced mucosal thickening. No mastoid or middle ear effusion. Normal orbits. MRA HEAD FINDINGS POSTERIOR CIRCULATION: --Vertebral arteries: Normal --Inferior cerebellar arteries: Normal. --Basilar artery: Normal. --Superior cerebellar arteries: Normal. --Posterior cerebral arteries: Multifocal irregularity, possibly exaggerated by motion ANTERIOR CIRCULATION: --Intracranial internal carotid arteries: Normal. --Anterior cerebral arteries (ACA): Normal. --Middle cerebral arteries (MCA): Multifocal irregularity, greatest at the origin of the right M1 segment. This may be somewhat exaggerated by motion ANATOMIC VARIANTS: None IMPRESSION: 1. No acute intracranial abnormality. 2. Multifocal irregularity, greatest at the origin of the right MCA M1 segment, which may be somewhat exaggerated by motion  artifact. Electronically Signed   By: Ulyses Jarred M.D.   On: 04/27/2021 21:02   US Carotid Bilateral (at [facility] and [facility] only)  Result Date: 04/28/2021 CLINICAL DATA:  Acute focal neurological deficit EXAM: BILATERAL CAROTID DUPLEX ULTRASOUND TECHNIQUE: Pearline Cables scale imaging, color Doppler and duplex ultrasound were  performed of bilateral carotid and vertebral arteries in the neck. COMPARISON:  None. FINDINGS: Criteria: Quantification of carotid stenosis is based on velocity parameters that correlate the residual internal carotid diameter with NASCET-based stenosis levels, using the diameter of the distal internal carotid lumen as the denominator for stenosis measurement. The following velocity measurements were obtained: RIGHT ICA: 75/21 cm/sec CCA: 78/24 cm/sec SYSTOLIC ICA/CCA RATIO:  1.0 ECA: 118 cm/sec LEFT ICA: 57/15 cm/sec CCA: 23/53 cm/sec SYSTOLIC ICA/CCA RATIO:  0.9 ECA: 79 cm/sec RIGHT CAROTID ARTERY: No significant atheromatous plaque. RIGHT VERTEBRAL ARTERY:  Antegrade flow. LEFT CAROTID ARTERY:  No significant atheromatous plaque. LEFT VERTEBRAL ARTERY:  Antegrade flow. IMPRESSION: No significant stenosis of internal carotid arteries. Electronically Signed   By: Miachel Roux M.D.   On: 04/28/2021 15:12   DG Chest Portable 1 View  Result Date: 04/27/2021 CLINICAL DATA:  Recent syncopal episode a couple minuets ago, tested positive for COVID earlier today, CGB 324. Arrived to hospital for complaints of urinary frequency and fevers x 2 days. No known heart or lung conditions. Nonsmoker. EXAM: PORTABLE CHEST 1 VIEW COMPARISON:  04/27/2021. FINDINGS: Cardiac silhouette normal in size. Normal mediastinal and hilar contours. Clear lungs.  No pleural effusion or pneumothorax. Skeletal structures are grossly intact. IMPRESSION: No active disease. Electronically Signed   By: Lajean Manes M.D.   On: 04/27/2021 16:26   EEG adult  Result Date: 04/28/2021 Lora Havens, MD     04/28/2021  5:58 PM Patient Name: ETHELEEN VALTIERRA MRN: [id number] Epilepsy Attending: Lora Havens Referring Physician/Provider: Athena Masse, MD Date: 04/28/2021 Duration: 30.37 mins Patient history: 66yo F with syncope. EEG to evaluate for seizure. Level of alertness: Awake, asleep AEDs during EEG study: None Technical aspects: This EEG  study was done with scalp electrodes positioned according to the 10-20 International system of electrode placement. Electrical activity was acquired at a sampling rate of 500Hz  and reviewed with a high frequency filter of 70Hz  and a low frequency filter of 1Hz . EEG data were recorded continuously and digitally stored. Description: The posterior dominant rhythm consists of 9 Hz activity of moderate voltage (25-35 uV) seen predominantly in posterior head regions, symmetric and reactive to eye opening and eye closing. Sleep was characterized by vertex waves, sleep spindles (12 to 14 Hz), maximal frontocentral region. Hyperventilation and photic stimulation were not performed.   IMPRESSION: This study is within normal limits. No seizures or epileptiform discharges were seen throughout the recording. Lora Havens   ECHOCARDIOGRAM COMPLETE  Result Date: 04/28/2021    ECHOCARDIOGRAM REPORT   Patient Name:   ELSIA LASOTA [facility] Date of Exam: 04/28/2021 Medical Rec #:  [id number]       Height:       62.0 in Accession #:    [id number]      Weight:       138.0 lb Date of Birth:  [birth year]       BSA:          1.633 m Patient Age:    34 years        BP:           159/92 mmHg Patient Gender: F  HR:           107 bpm. Exam Location:  [facility] Procedure: 2D Echo, Color Doppler and Cardiac Doppler Indications:     R55 Syncope  History:         Patient has prior history of Echocardiogram examinations. Risk                  Factors:Hypertension and Diabetes. Pt tested positive for                  COVID-19 on 04/27/21.  Sonographer:     Charmayne Sheer Referring Phys:  [id number] Athena Masse Diagnosing Phys: Kathlyn Sacramento MD  Sonographer Comments: Suboptimal apical window. IMPRESSIONS  1. Left ventricular ejection fraction, by estimation, is 55 to 60%. The left ventricle has normal function. The left ventricle has no regional wall motion abnormalities. Left ventricular diastolic parameters are consistent with Grade I diastolic  dysfunction (impaired relaxation).  2. Right ventricular systolic function is normal. The right ventricular size is normal. Tricuspid regurgitation signal is inadequate for assessing PA pressure.  3. The mitral valve is normal in structure. Trivial mitral valve regurgitation. No evidence of mitral stenosis.  4. The aortic valve is normal in structure. Aortic valve regurgitation is not visualized. Aortic valve sclerosis is present, with no evidence of aortic valve stenosis. FINDINGS  Left Ventricle: Left ventricular ejection fraction, by estimation, is 55 to 60%. The left ventricle has normal function. The left ventricle has no regional wall motion abnormalities. The left ventricular internal cavity size was normal in size. There is  no left ventricular hypertrophy. Left ventricular diastolic parameters are consistent with Grade I diastolic dysfunction (impaired relaxation). Right Ventricle: The right ventricular size is normal. No increase in right ventricular wall thickness. Right ventricular systolic function is normal. Tricuspid regurgitation signal is inadequate for assessing PA pressure. Left Atrium: Left atrial size was normal in size. Right Atrium: Right atrial size was normal in size. Pericardium: There is no evidence of pericardial effusion. Mitral Valve: The mitral valve is normal in structure. Trivial mitral valve regurgitation. No evidence of mitral valve stenosis. MV peak gradient, 5.3 mmHg. The mean mitral valve gradient is 2.0 mmHg. Tricuspid Valve: The tricuspid valve is normal in structure. Tricuspid valve regurgitation is not demonstrated. No evidence of tricuspid stenosis. Aortic Valve: The aortic valve is normal in structure. Aortic valve regurgitation is not visualized. Aortic valve sclerosis is present, with no evidence of aortic valve stenosis. Aortic valve mean gradient measures 4.0 mmHg. Aortic valve peak gradient measures 6.0 mmHg. Aortic valve area, by VTI measures 1.51 cm. Pulmonic  Valve: The pulmonic valve was normal in structure. Pulmonic valve regurgitation is not visualized. No evidence of pulmonic stenosis. Aorta: The aortic root is normal in size and structure. Venous: The inferior vena cava was not well visualized. IAS/Shunts: No atrial level shunt detected by color flow Doppler.  LEFT VENTRICLE PLAX 2D LVIDd:         4.09 cm   Diastology LVIDs:         2.58 cm   LV e' medial:    11.10 cm/s LV PW:         0.87 cm   LV E/e' medial:  6.8 LV IVS:        0.67 cm   LV e' lateral:   6.74 cm/s LVOT diam:     1.90 cm   LV E/e' lateral: 11.2 LV SV:         28  LV SV Index:   17 LVOT Area:     2.84 cm  RIGHT VENTRICLE RV Basal diam:  2.28 cm LEFT ATRIUM             Index        RIGHT ATRIUM          Index LA diam:        2.80 cm 1.71 cm/m   RA Area:     8.77 cm LA Vol (A2C):   24.3 ml 14.88 ml/m  RA Volume:   14.80 ml 9.06 ml/m LA Vol (A4C):   26.6 ml 16.29 ml/m LA Biplane Vol: 27.1 ml 16.60 ml/m  AORTIC VALVE                    PULMONIC VALVE AV Area (Vmax):    1.58 cm     PV Vmax:       1.42 m/s AV Area (Vmean):   1.45 cm     PV Vmean:      99.700 cm/s AV Area (VTI):     1.51 cm     PV VTI:        0.246 m AV Vmax:           122.00 cm/s  PV Peak grad:  8.1 mmHg AV Vmean:          89.700 cm/s  PV Mean grad:  4.0 mmHg AV VTI:            0.186 m AV Peak Grad:      6.0 mmHg AV Mean Grad:      4.0 mmHg LVOT Vmax:         67.90 cm/s LVOT Vmean:        45.900 cm/s LVOT VTI:          0.099 m LVOT/AV VTI ratio: 0.53  AORTA Ao Root diam: 2.70 cm MITRAL VALVE MV Area (PHT): 6.71 cm     SHUNTS MV Area VTI:   1.69 cm     Systemic VTI:  0.10 m MV Peak grad:  5.3 mmHg     Systemic Diam: 1.90 cm MV Mean grad:  2.0 mmHg MV Vmax:       1.15 m/s MV Vmean:      73.4 cm/s MV Decel Time: 113 msec MV E velocity: 75.80 cm/s MV A velocity: 103.00 cm/s MV E/A ratio:  0.74 Kathlyn Sacramento MD Electronically signed by Kathlyn Sacramento MD Signature Date/Time: 04/28/2021/11:25:55 AM    Final        Subjective:  Patient seen and examined at the bedside this morning.  Hemodynamically stable for discharge today.  Denies any complaints  Discharge Exam: Vitals:   04/29/21 0600 04/29/21 0630  BP: (!) 145/80 128/79  Pulse: (!) 101 (!) 101  Resp: 18 18  Temp:    SpO2: 94% 99%   Vitals:   04/29/21 0500 04/29/21 0530 04/29/21 0600 04/29/21 0630  BP: (!) 150/84 (!) 151/78 (!) 145/80 128/79  Pulse: (!) 105 98 (!) 101 (!) 101  Resp: 18 18 18 18   Temp:      TempSrc:      SpO2: 97% 96% 94% 99%  Weight:      Height:        General: Pt is alert, awake, not in acute distress Cardiovascular: RRR, S1/S2 +, no rubs, no gallops Respiratory: CTA bilaterally, no wheezing, no rhonchi Abdominal: Soft, NT, ND, bowel sounds + Extremities: no edema, no cyanosis  The results of significant diagnostics from this hospitalization (including imaging, microbiology, ancillary and laboratory) are listed below for reference.     Microbiology: Recent Results (from the past 240 hour(s))  Culture, blood (Routine x 2)     Status: None (Preliminary result)   Collection Time: 04/27/21  7:07 AM   Specimen: BLOOD  Result Value Ref Range Status   Specimen Description BLOOD LEFT [facility]  Final   Special Requests   Final    BOTTLES DRAWN AEROBIC AND ANAEROBIC Blood Culture adequate volume   Culture   Final    NO GROWTH 2 DAYS Performed at [facility], [address] [place], [place] [address]    Report Status PENDING  Incomplete  Resp Panel by RT-PCR (Flu A&B, Covid) Nasopharyngeal Swab     Status: Abnormal   Collection Time: 04/27/21 11:30 AM   Specimen: Nasopharyngeal Swab; Nasopharyngeal(NP) swabs in vial transport medium  Result Value Ref Range Status   SARS Coronavirus 2 by RT PCR POSITIVE (A) NEGATIVE Final    Comment: (NOTE) SARS-CoV-2 target nucleic acids are DETECTED.  The SARS-CoV-2 RNA is generally detectable in upper respiratory specimens during the acute phase of  infection. Positive results are indicative of the presence of the identified virus, but do not rule out bacterial infection or co-infection with other pathogens not detected by the test. Clinical correlation with patient history and other diagnostic information is necessary to determine patient infection status. The expected result is Negative.  Fact Sheet for Patients: EntrepreneurPulse.com.au  Fact Sheet for Healthcare Providers: IncredibleEmployment.be  This test is not yet approved or cleared by the [place] FDA and  has been authorized for detection and/or diagnosis of SARS-CoV-2 by FDA under an Emergency Use Authorization (EUA).  This EUA will remain in effect (meaning this test can be used) for the duration of  the COVID-19 declaration under Section 564(b)(1) of the A ct, 21 U.S.C. section 360bbb-3(b)(1), unless the authorization is terminated or revoked sooner.     Influenza A by PCR NEGATIVE NEGATIVE Final   Influenza B by PCR NEGATIVE NEGATIVE Final    Comment: (NOTE) The Xpert Xpress SARS-CoV-2/FLU/RSV plus assay is intended as an aid in the diagnosis of influenza from Nasopharyngeal swab specimens and should not be used as a sole basis for treatment. Nasal washings and aspirates are unacceptable for Xpert Xpress SARS-CoV-2/FLU/RSV testing.  Fact Sheet for Patients: EntrepreneurPulse.com.au  Fact Sheet for Healthcare Providers: IncredibleEmployment.be  This test is not yet approved or cleared by the [place] FDA and has been authorized for detection and/or diagnosis of SARS-CoV-2 by FDA under an Emergency Use Authorization (EUA). This EUA will remain in effect (meaning this test can be used) for the duration of the COVID-19 declaration under Section 564(b)(1) of the Act, 21 U.S.C. section 360bbb-3(b)(1), unless the authorization is terminated or revoked.  Performed at [facility], [address] [place], [place] [address]   Culture, blood (Routine x 2)     Status: None (Preliminary result)   Collection Time: 04/27/21 11:39 AM   Specimen: BLOOD  Result Value Ref Range Status   Specimen Description BLOOD LEFT [facility]  Final   Special Requests   Final    BOTTLES DRAWN AEROBIC AND ANAEROBIC Blood Culture adequate volume   Culture   Final    NO GROWTH 2 DAYS Performed at [facility], [address] [place], [place] [address]    Report Status PENDING  Incomplete     Labs:  BNP (last 3 results) No results for input(s): BNP in the last 8760 hours. Basic Metabolic Panel: Recent Labs  Lab 04/27/21 0655 04/27/21 1645 04/28/21 0649  NA 137 139 139  K 3.6 4.0 3.8  CL 102 108 107  CO2 25 25 25   GLUCOSE 246* 337* 155*  BUN 18 17 14   CREATININE 1.24* 1.36* 0.97  CALCIUM 9.7 8.8* 8.9   Liver Function Tests: Recent Labs  Lab 04/27/21 0655 04/27/21 1645  AST 17 18  ALT 20 15  ALKPHOS 82 69  BILITOT 0.3 0.5  PROT 8.0 6.7  ALBUMIN 4.3 3.4*   No results for input(s): LIPASE, AMYLASE in the last 168 hours. No results for input(s): AMMONIA in the last 168 hours. CBC: Recent Labs  Lab 04/27/21 0655 04/27/21 1645 04/28/21 0649  WBC 8.8 8.0 7.3  NEUTROABS 6.9  --   --   HGB 13.9 11.6* 12.1  HCT 42.9 36.2 37.9  MCV 90.7 92.8 91.5  PLT 248 204 176   Cardiac Enzymes: No results for input(s): CKTOTAL, CKMB, CKMBINDEX, TROPONINI in the last 168 hours. BNP: Invalid input(s): POCBNP CBG: Recent Labs  Lab 04/28/21 1625 04/28/21 2041 04/29/21 0007 04/29/21 0455 04/29/21 0752  GLUCAP 184* 135* 154* 111* 111*   D-Dimer Recent Labs    04/28/21 0026  DDIMER 0.50   Hgb A1c Recent Labs    04/28/21 0026  HGBA1C 8.4*   Lipid Profile Recent Labs    04/28/21 0649  CHOL 143  HDL 62  LDLCALC 67  TRIG 71  CHOLHDL 2.3   Thyroid function studies Recent Labs    04/28/21 0026  TSH 2.026   Anemia work up Recent Labs     04/28/21 0026  VITAMINB12 209  FOLATE 9.8  FERRITIN 98  TIBC 266  IRON 26*  RETICCTPCT 1.8   Urinalysis    Component Value Date/Time   COLORURINE STRAW (A) 04/27/2021 1730   APPEARANCEUR CLEAR (A) 04/27/2021 1730   APPEARANCEUR Clear 04/07/2016 1829   LABSPEC 1.027 04/27/2021 1730   PHURINE 6.0 04/27/2021 1730   GLUCOSEU >=500 (A) 04/27/2021 1730   HGBUR NEGATIVE 04/27/2021 1730   BILIRUBINUR NEGATIVE 04/27/2021 1730   BILIRUBINUR Negative 04/07/2016 1829   KETONESUR NEGATIVE 04/27/2021 1730   PROTEINUR NEGATIVE 04/27/2021 1730   NITRITE NEGATIVE 04/27/2021 1730   LEUKOCYTESUR NEGATIVE 04/27/2021 1730   Sepsis Labs Invalid input(s): PROCALCITONIN,  WBC,  LACTICIDVEN Microbiology Recent Results (from the past 240 hour(s))  Culture, blood (Routine x 2)     Status: None (Preliminary result)   Collection Time: 04/27/21  7:07 AM   Specimen: BLOOD  Result Value Ref Range Status   Specimen Description BLOOD LEFT AC  Final   Special Requests   Final    BOTTLES DRAWN AEROBIC AND ANAEROBIC Blood Culture adequate volume   Culture   Final    NO GROWTH 2 DAYS Performed at [facility], [address], [place], [place] [address]    Report Status PENDING  Incomplete  Resp Panel by RT-PCR (Flu A&B, Covid) Nasopharyngeal Swab     Status: Abnormal   Collection Time: 04/27/21 11:30 AM   Specimen: Nasopharyngeal Swab; Nasopharyngeal(NP) swabs in vial transport medium  Result Value Ref Range Status   SARS Coronavirus 2 by RT PCR POSITIVE (A) NEGATIVE Final    Comment: (NOTE) SARS-CoV-2 target nucleic acids are DETECTED.  The SARS-CoV-2 RNA is generally detectable in upper respiratory specimens during the acute phase of infection. Positive results are indicative  of the presence of the identified virus, but do not rule out bacterial infection or co-infection with other pathogens not detected by the test. Clinical correlation with patient history and other diagnostic  information is necessary to determine patient infection status. The expected result is Negative.  Fact Sheet for Patients: EntrepreneurPulse.com.au  Fact Sheet for Healthcare Providers: IncredibleEmployment.be  This test is not yet approved or cleared by the [place] FDA and  has been authorized for detection and/or diagnosis of SARS-CoV-2 by FDA under an Emergency Use Authorization (EUA).  This EUA will remain in effect (meaning this test can be used) for the duration of  the COVID-19 declaration under Section 564(b)(1) of the A ct, 21 U.S.C. section 360bbb-3(b)(1), unless the authorization is terminated or revoked sooner.     Influenza A by PCR NEGATIVE NEGATIVE Final   Influenza B by PCR NEGATIVE NEGATIVE Final    Comment: (NOTE) The Xpert Xpress SARS-CoV-2/FLU/RSV plus assay is intended as an aid in the diagnosis of influenza from Nasopharyngeal swab specimens and should not be used as a sole basis for treatment. Nasal washings and aspirates are unacceptable for Xpert Xpress SARS-CoV-2/FLU/RSV testing.  Fact Sheet for Patients: EntrepreneurPulse.com.au  Fact Sheet for Healthcare Providers: IncredibleEmployment.be  This test is not yet approved or cleared by the [place] FDA and has been authorized for detection and/or diagnosis of SARS-CoV-2 by FDA under an Emergency Use Authorization (EUA). This EUA will remain in effect (meaning this test can be used) for the duration of the COVID-19 declaration under Section 564(b)(1) of the Act, 21 U.S.C. section 360bbb-3(b)(1), unless the authorization is terminated or revoked.  Performed at [facility], [address] [place], [place] [address]   Culture, blood (Routine x 2)     Status: None (Preliminary result)   Collection Time: 04/27/21 11:39 AM   Specimen: BLOOD  Result Value Ref Range Status   Specimen Description BLOOD LEFT [facility]   Final   Special Requests   Final    BOTTLES DRAWN AEROBIC AND ANAEROBIC Blood Culture adequate volume   Culture   Final    NO GROWTH 2 DAYS Performed at [facility], [address], [place], [place] [address]    Report Status PENDING  Incomplete    Please note: You were cared for by a hospitalist during your hospital stay. Once you are discharged, your primary care physician will handle any further medical issues. Please note that NO REFILLS for any discharge medications will be authorized once you are discharged, as it is imperative that you return to your primary care physician (or establish a relationship with a primary care physician if you do not have one) for your post hospital discharge needs so that they can reassess your need for medications and monitor your lab values.    Time coordinating discharge: 40 minutes  SIGNED:   Shelly Coss, MD  [facility] 04/29/2021, 11:35 AM Pager [phone]  If 7PM-7AM, please contact night-coverage [email or web address] Password [email or web address]

## 2021-04-29 NOTE — TOC Initial Note (Signed)
Transition of Care [facility]) - Initial/Assessment Note    Patient Details  Name: Kelly Doyle MRN: [id number] Date of Birth: December 13, 1955  Transition of Care [facility]) CM/SW Contact:    Shelbie Hutching, RN Phone Number: 04/29/2021, 10:43 AM  Clinical Narrative:                  Patient is medically cleared for discharge today.  She has tested positive for COVID and she was staying at the shelter [facility]) before coming to the hospital.  She cannot return to the shelter with [place] she must have a negative test before they will let her return.   Per Joyce Gross at the shelter they have been working with the patient on finding housing.  She gets $1100 / month on the 3rd of every month.  RNCM asked patient if she could get a hotel room for a week but she reports she has no money.  She does not know what her check was spent on but she has not money.  She and her son were living in her car, son has the car, she is going to try and reach him.  Son was at the hospital yesterday per the bedside RN.    RNCM informed patient that their is no housing available for COVID + patient's.  She will make some calls and try to find somewhere to go.     Expected Discharge Plan: Homeless Shelter Barriers to Discharge: Barriers Resolved   Patient Goals and CMS Choice Patient states their goals for this hospitalization and ongoing recovery are:: Patient wants to get back into the shelter but she is covid + and they will not accept her back until test negative      Expected Discharge Plan and Services Expected Discharge Plan: Homeless Shelter   Discharge Planning Services: CM Consult   Living arrangements for the past 2 months: Homeless, [place] Expected Discharge Date: 04/29/21               DME Arranged: N/A DME Agency: NA       HH Arranged: NA [place] Agency: NA        Prior Living Arrangements/Services Living arrangements for the past 2 months: Homeless, [place] Lives with:: Adult  Children Patient language and need for interpreter reviewed:: Yes Do you feel safe going back to the place where you live?: Yes      Need for Family Participation in Patient Care: Yes (Comment) Care giver support system in place?: Yes (comment)   Criminal Activity/Legal Involvement Pertinent to Current Situation/Hospitalization: No - Comment as needed  Activities of Daily Living      Permission Sought/Granted Permission sought to share information with : Case Manager, Customer service manager Permission granted to share information with : Yes, Verbal Permission Granted     Permission granted to share info w AGENCY: [facility]        Emotional Assessment Appearance:: Appears stated age Attitude/Demeanor/Rapport: Engaged Affect (typically observed): Accepting Orientation: : Oriented to Self, Oriented to Place, Oriented to  Time, Oriented to Situation Alcohol / Substance Use: Not Applicable Psych Involvement: No (comment)  Admission diagnosis:  Syncope and collapse [R55] Patient Active Problem List   Diagnosis Date Noted   COVID-19 virus infection 04/27/2021   Hyperglycemia due to type 2 diabetes mellitus ([place]) 04/27/2021   Syncope and collapse 04/27/2021   Insomnia 02/13/2016   Stage 3b chronic kidney disease ([place]) 02/13/2016   Hyperlipidemia 10/03/2015   Iron deficiency anemia  10/03/2015   Major depressive disorder, single episode, severe without psychotic features ([place]) 10/03/2014   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

## 2021-05-02 LAB — CULTURE, BLOOD (ROUTINE X 2)
Culture: NO GROWTH
Culture: NO GROWTH
Special Requests: ADEQUATE
Special Requests: ADEQUATE

## 2021-07-09 ENCOUNTER — Encounter: Payer: Self-pay | Admitting: Emergency Medicine

## 2021-07-09 ENCOUNTER — Emergency Department
Admission: EM | Admit: 2021-07-09 | Discharge: 2021-07-09 | Disposition: A | Discharge status: Home / Self Care | Payer: Medicare HMO | Attending: Emergency Medicine | Admitting: Emergency Medicine

## 2021-07-09 ENCOUNTER — Other Ambulatory Visit: Payer: Self-pay

## 2021-07-09 DIAGNOSIS — E1165 Type 2 diabetes mellitus with hyperglycemia: Secondary | ICD-10-CM | POA: Diagnosis not present

## 2021-07-09 DIAGNOSIS — R350 Frequency of micturition: Secondary | ICD-10-CM

## 2021-07-09 DIAGNOSIS — R739 Hyperglycemia, unspecified: Secondary | ICD-10-CM

## 2021-07-09 DIAGNOSIS — R7989 Other specified abnormal findings of blood chemistry: Secondary | ICD-10-CM | POA: Diagnosis not present

## 2021-07-09 DIAGNOSIS — I10 Essential (primary) hypertension: Secondary | ICD-10-CM | POA: Diagnosis not present

## 2021-07-09 DIAGNOSIS — R309 Painful micturition, unspecified: Secondary | ICD-10-CM | POA: Diagnosis present

## 2021-07-09 LAB — CBC
HCT: 39.5 % (ref 36.0–46.0)
Hemoglobin: 12.6 g/dL (ref 12.0–15.0)
MCH: 28.8 pg (ref 26.0–34.0)
MCHC: 31.9 g/dL (ref 30.0–36.0)
MCV: 90.2 fL (ref 80.0–100.0)
Platelets: 270 10*3/uL (ref 150–400)
RBC: 4.38 MIL/uL (ref 3.87–5.11)
RDW: 11.9 % (ref 11.5–15.5)
WBC: 7.6 10*3/uL (ref 4.0–10.5)
nRBC: 0 % (ref 0.0–0.2)

## 2021-07-09 LAB — BASIC METABOLIC PANEL
Anion gap: 6 (ref 5–15)
BUN: 19 mg/dL (ref 8–23)
CO2: 29 mmol/L (ref 22–32)
Calcium: 9.8 mg/dL (ref 8.9–10.3)
Chloride: 100 mmol/L (ref 98–111)
Creatinine, Ser: 1.17 mg/dL — ABNORMAL HIGH (ref 0.44–1.00)
GFR, Estimated: 51 mL/min — ABNORMAL LOW (ref >60)
Glucose, Bld: 440 mg/dL — ABNORMAL HIGH (ref 70–99)
Potassium: 4.2 mmol/L (ref 3.5–5.1)
Sodium: 135 mmol/L (ref 135–145)

## 2021-07-09 LAB — URINALYSIS, ROUTINE W REFLEX MICROSCOPIC
Bacteria, UA: NONE SEEN
Bilirubin Urine: NEGATIVE
Glucose, UA: >=500 mg/dL — AB
Hgb urine dipstick: NEGATIVE
Ketones, ur: NEGATIVE mg/dL
Leukocytes,Ua: NEGATIVE
Nitrite: NEGATIVE
Protein, ur: NEGATIVE mg/dL
Specific Gravity, Urine: 1.032 — ABNORMAL HIGH (ref 1.005–1.030)
pH: 5 (ref 5.0–8.0)

## 2021-07-09 LAB — POC URINE PREG, ED: Preg Test, Ur: NEGATIVE

## 2021-07-09 LAB — CBG MONITORING, ED: Glucose-Capillary: 225 mg/dL — ABNORMAL HIGH (ref 70–99)

## 2021-07-09 MED ORDER — INSULIN DETEMIR 100 UNIT/ML ~~LOC~~ SOLN: 30.0000 [IU] | Freq: Every day | SUBCUTANEOUS | 0 refills | Status: DC

## 2021-07-09 MED ORDER — JARDIANCE 25 MG PO TABS: 25.0000 mg | ORAL_TABLET | Freq: Every day | ORAL | 0 refills | Status: DC

## 2021-07-09 MED ORDER — LISINOPRIL 40 MG PO TABS: 40.0000 mg | ORAL_TABLET | Freq: Every day | ORAL | 0 refills | Status: DC

## 2021-07-09 MED ORDER — SODIUM CHLORIDE 0.9 % IV BOLUS
1000.0000 mL | Freq: Once | INTRAVENOUS | Status: AC
  Administered 2021-07-09: 1000 mL via INTRAVENOUS

## 2021-07-09 MED ORDER — INSULIN ASPART 100 UNIT/ML IJ SOLN
5.0000 [IU] | Freq: Once | INTRAMUSCULAR | Status: AC
  Administered 2021-07-09: 5 [IU] via INTRAVENOUS
  Filled 2021-07-09: qty 1

## 2021-07-09 MED ORDER — AMLODIPINE BESYLATE 10 MG PO TABS
10.0000 mg | ORAL_TABLET | Freq: Every day | ORAL | 0 refills | Status: DC
Start: 2021-07-09 — End: 2021-07-10

## 2021-07-09 NOTE — ED Notes (Signed)
Bladder scan showed 69m after using restroom. ?

## 2021-07-09 NOTE — Discharge Instructions (Addendum)
I have restarted your blood pressure and your insulin medications.  I held off on restarting your Victoza until you see what your glucoses are running.  Please go to your [facility] within the next week to have a glucose rechecked and if it still staying elevated then they can restart the Victoza.  Return to the ER if you develop worsening symptoms or any other concerns by suspect that this is all because your sugars are elevated today ?

## 2021-07-09 NOTE — ED Provider Notes (Signed)
? ?[facility] ?Provider Note ? ? ? Event Date/Time  ? First MD Initiated Contact with Patient 07/09/21 1554   ?  (approximate) ? ? ?History  ? ?Urinary Frequency ? ? ?HPI ? ?Kelly Doyle is a 66 y.o. female with history of diabetes, hypertension who comes in with concerns for urinary frequency.  Patient reports that she has not been taking any of her medications for the past month due to recently moving into her car and misplacing them.  She states that she is supposed to be on insulin at nighttime has not been taking them.  She denies any other symptoms just reports that she has frequent urination and she is worried that she could have a UTI.  She denies any abdominal pain, back pain or any other concerns. ? ? ?Physical Exam  ? ?Triage Vital Signs: ?ED Triage Vitals  ?Enc Vitals Group  ?   BP 07/09/21 1429 (!) 161/112  ?   Pulse Rate 07/09/21 1429 100  ?   Resp 07/09/21 1429 16  ?   Temp 07/09/21 1429 97.8 ?F (36.6 ?C)  ?   Temp Source 07/09/21 1429 Oral  ?   SpO2 07/09/21 1429 94 %  ?   Weight 07/09/21 1358 140 lb 3.4 oz (63.6 kg)  ?   Height 07/09/21 1358 '5\' 2"'$  (1.575 m)  ?   Head Circumference --   ?   Peak Flow --   ?   Pain Score 07/09/21 1358 0  ?   Pain Loc --   ?   Pain Edu? --   ?   Excl. in [place]? --   ? ? ?Most recent vital signs: ?Vitals:  ? 07/09/21 1429  ?BP: (!) 161/112  ?Pulse: 100  ?Resp: 16  ?Temp: 97.8 ?F (36.6 ?C)  ?SpO2: 94%  ? ? ? ?General: Awake, no distress.  ?CV:  Good peripheral perfusion.  ?Resp:  Normal effort.  ?Abd:  No distention. Soft non tender ?Other:  No CVA tenderness ? ? ?ED Results / Procedures / Treatments  ? ?Labs ?(all labs ordered are listed, but only abnormal results are displayed) ?Labs Reviewed  ?URINALYSIS, ROUTINE W REFLEX MICROSCOPIC - Abnormal; Notable for the following components:  ?    Result Value  ? Color, Urine STRAW (*)   ? APPearance CLEAR (*)   ? Specific Gravity, Urine 1.032 (*)   ? Glucose, UA >=500 (*)   ? All other components within  normal limits  ?BASIC METABOLIC PANEL - Abnormal; Notable for the following components:  ? Glucose, Bld 440 (*)   ? Creatinine, Ser 1.17 (*)   ? GFR, Estimated 51 (*)   ? All other components within normal limits  ?CBC  ?POC URINE PREG, ED  ?CBG MONITORING, ED  ? ? ? ?MEDICATIONS ORDERED IN ED: ?Medications  ?sodium chloride 0.9 % bolus 1,000 mL (has no administration in time range)  ?insulin aspart (novoLOG) injection 5 Units (has no administration in time range)  ? ? ? ?IMPRESSION / MDM / ASSESSMENT AND PLAN / ED COURSE  ?I reviewed the triage vital signs and the nursing notes. ?             ?               ? ?Differential diagnosis includes, but is not limited to, UTI, hyperglycemia, urinary retention.  Patient is homeless but does report living in her car.  Offered her to talk with a social  worker and she reports that she already talked to want to do today so declined.  Patient does report having insurance so should be able to get her medication she reports just needs new prescriptions. ? ?CBC normal ?BMP shows elevated sugar of 440 but anion gap is normal.  Creatinine slightly elevated at 1.17 ? ?Her pregnancy test was negative.  Urine is without evidence of UTI but she does have significant amount of glucose in it with elevated specific gravity.  Discussed with patient I suspect that this is most likely secondary to her diabetes.  We will give her 1 L of fluid and some IV insulin now in a pharmacy do a med rec in order to write new prescriptions for her so she can get back on her insulin. ? ?Postvoid bladder scan was normal ? ?Patient's medications were med rec.  We will initially start the insulin detemir and the Jardiance given she is been off of it for a month just to ensure that she does not dip too low we will hold off on the Victoza for now and she can follow-up with her primary care doctor for recheck of her sugar and to have the rest of her medications started.  We will also restart her blood pressure  medicine and due to her blood pressure being high.  She reports that her anxiety is well controlled at this time denies any SI we will hold off on restarting these she will follow this up with her primary care doctor. ? ? ?Patient feels comfortable with discharge home and understands the importance of following up to recheck her blood pressure and have the rest of her medications restarted.  I did consider admission given how hyperglycemic she was but given she had no evidence of DKA and sugars have come down with IV insulin and fluids I think that is reasonable for patient to go home and follow-up outpatient with restarting her home medications.  Patient feels comfortable with this plan ? ? ? ? ? ? ?FINAL CLINICAL IMPRESSION(S) / ED DIAGNOSES  ? ?Final diagnoses:  ?Hyperglycemia  ?Urinary frequency  ? ? ? ?Rx / DC Orders  ? ?ED Discharge Orders   ? ?      Ordered  ?  amLODipine (NORVASC) 10 MG tablet  Daily       ? 07/09/21 1723  ?  insulin detemir (LEVEMIR) 100 UNIT/ML injection  Daily,   Status:  Discontinued       ? 07/09/21 1723  ?  JARDIANCE 25 MG TABS tablet  Daily       ? 07/09/21 1723  ?  lisinopril (ZESTRIL) 40 MG tablet  Daily       ? 07/09/21 1723  ?  insulin detemir (LEVEMIR) 100 UNIT/ML injection  Daily       ? 07/09/21 1724  ? ?  ?  ? ?  ? ? ? ?Note:  This document was prepared using Dragon voice recognition software and may include unintentional dictation errors. ?  ?Vanessa Fairfield, MD ?07/09/21 1725 ? ?

## 2021-07-09 NOTE — ED Triage Notes (Signed)
Pt comes into the ED via POV c/o urinary frequency and incontinence.  Pt states that within 30 minutes of drinking, she has to urinate.  Pt is diabetic and has not been checking her CBG at home.  Pt denies any pain, nausea, or dizziness.  Pt ambulatory and in NAD with even and unlabored respirations. Pt states that she is currently living in a car and is having frequent episodes of incontinence.  ?

## 2021-07-10 ENCOUNTER — Telehealth: Payer: Self-pay | Admitting: Emergency Medicine

## 2021-07-10 MED ORDER — LISINOPRIL 40 MG PO TABS: 40.0000 mg | ORAL_TABLET | Freq: Every day | ORAL | 2 refills | Status: AC

## 2021-07-10 MED ORDER — INSULIN DETEMIR 100 UNIT/ML ~~LOC~~ SOLN: 30.0000 [IU] | Freq: Every day | SUBCUTANEOUS | 2 refills | Status: DC

## 2021-07-10 MED ORDER — JARDIANCE 25 MG PO TABS: 25.0000 mg | ORAL_TABLET | Freq: Every day | ORAL | 2 refills | Status: AC

## 2021-07-10 MED ORDER — AMLODIPINE BESYLATE 10 MG PO TABS: 10.0000 mg | ORAL_TABLET | Freq: Every day | ORAL | 2 refills | Status: AC

## 2021-07-10 NOTE — Telephone Encounter (Cosign Needed)
Patient called back to the emergency department requesting that her medications from last night be sent to a different pharmacy.  Patient would like her medicine sent to the [facility] on [address].  These are prescribed at this time for the patient. ?

## 2021-08-06 ENCOUNTER — Other Ambulatory Visit: Payer: Self-pay | Admitting: Internal Medicine

## 2021-08-06 DIAGNOSIS — R413 Other amnesia: Secondary | ICD-10-CM

## 2021-08-14 ENCOUNTER — Ambulatory Visit
Admission: RE | Admit: 2021-08-14 | Discharge: 2021-08-14 | Disposition: A | Discharge status: Home / Self Care | Payer: Medicare HMO | Source: Ambulatory Visit | Attending: Internal Medicine | Admitting: Internal Medicine

## 2021-08-14 DIAGNOSIS — R413 Other amnesia: Secondary | ICD-10-CM | POA: Insufficient documentation

## 2021-10-13 ENCOUNTER — Emergency Department
Admission: EM | Admit: 2021-10-13 | Discharge: 2021-10-13 | Disposition: A | Discharge status: Home / Self Care | Payer: Medicare HMO | Attending: Emergency Medicine | Admitting: Emergency Medicine

## 2021-10-13 ENCOUNTER — Other Ambulatory Visit: Payer: Self-pay

## 2021-10-13 ENCOUNTER — Encounter: Payer: Self-pay | Admitting: Emergency Medicine

## 2021-10-13 DIAGNOSIS — R531 Weakness: Secondary | ICD-10-CM | POA: Diagnosis present

## 2021-10-13 DIAGNOSIS — T675XXA Heat exhaustion, unspecified, initial encounter: Secondary | ICD-10-CM | POA: Insufficient documentation

## 2021-10-13 DIAGNOSIS — R739 Hyperglycemia, unspecified: Secondary | ICD-10-CM | POA: Insufficient documentation

## 2021-10-13 DIAGNOSIS — R944 Abnormal results of kidney function studies: Secondary | ICD-10-CM | POA: Diagnosis not present

## 2021-10-13 DIAGNOSIS — E860 Dehydration: Secondary | ICD-10-CM | POA: Diagnosis not present

## 2021-10-13 LAB — URINALYSIS, ROUTINE W REFLEX MICROSCOPIC
Bacteria, UA: NONE SEEN
Bilirubin Urine: NEGATIVE
Glucose, UA: >=500 mg/dL — AB
Hgb urine dipstick: NEGATIVE
Ketones, ur: NEGATIVE mg/dL
Leukocytes,Ua: NEGATIVE
Nitrite: NEGATIVE
Protein, ur: NEGATIVE mg/dL
Specific Gravity, Urine: 1.026 (ref 1.005–1.030)
pH: 5 (ref 5.0–8.0)

## 2021-10-13 LAB — CBC
HCT: 37 % (ref 36.0–46.0)
Hemoglobin: 11.6 g/dL — ABNORMAL LOW (ref 12.0–15.0)
MCH: 28.6 pg (ref 26.0–34.0)
MCHC: 31.4 g/dL (ref 30.0–36.0)
MCV: 91.1 fL (ref 80.0–100.0)
Platelets: 262 10*3/uL (ref 150–400)
RBC: 4.06 MIL/uL (ref 3.87–5.11)
RDW: 11.9 % (ref 11.5–15.5)
WBC: 7.4 10*3/uL (ref 4.0–10.5)
nRBC: 0 % (ref 0.0–0.2)

## 2021-10-13 LAB — BASIC METABOLIC PANEL
Anion gap: 11 (ref 5–15)
BUN: 26 mg/dL — ABNORMAL HIGH (ref 8–23)
CO2: 24 mmol/L (ref 22–32)
Calcium: 9.4 mg/dL (ref 8.9–10.3)
Chloride: 102 mmol/L (ref 98–111)
Creatinine, Ser: 1.62 mg/dL — ABNORMAL HIGH (ref 0.44–1.00)
GFR, Estimated: 35 mL/min — ABNORMAL LOW (ref >60)
Glucose, Bld: 348 mg/dL — ABNORMAL HIGH (ref 70–99)
Potassium: 4 mmol/L (ref 3.5–5.1)
Sodium: 137 mmol/L (ref 135–145)

## 2021-10-13 LAB — CBG MONITORING, ED
Glucose-Capillary: 436 mg/dL — ABNORMAL HIGH (ref 70–99)
Glucose-Capillary: 227 mg/dL — ABNORMAL HIGH (ref 70–99)

## 2021-10-13 MED ORDER — SODIUM CHLORIDE 0.9 % IV BOLUS
1000.0000 mL | Freq: Once | INTRAVENOUS | Status: AC
  Administered 2021-10-13: 1000 mL via INTRAVENOUS

## 2022-03-17 ENCOUNTER — Emergency Department
Admission: EM | Admit: 2022-03-17 | Discharge: 2022-03-17 | Disposition: A | Discharge status: Home / Self Care | Payer: Medicare HMO | Attending: Emergency Medicine | Admitting: Emergency Medicine

## 2022-03-17 ENCOUNTER — Encounter: Payer: Self-pay | Admitting: Emergency Medicine

## 2022-03-17 ENCOUNTER — Other Ambulatory Visit: Payer: Self-pay

## 2022-03-17 DIAGNOSIS — R42 Dizziness and giddiness: Secondary | ICD-10-CM | POA: Diagnosis not present

## 2022-03-17 DIAGNOSIS — N189 Chronic kidney disease, unspecified: Secondary | ICD-10-CM | POA: Insufficient documentation

## 2022-03-17 DIAGNOSIS — R531 Weakness: Secondary | ICD-10-CM | POA: Diagnosis present

## 2022-03-17 DIAGNOSIS — R55 Syncope and collapse: Secondary | ICD-10-CM | POA: Diagnosis not present

## 2022-03-17 DIAGNOSIS — E1122 Type 2 diabetes mellitus with diabetic chronic kidney disease: Secondary | ICD-10-CM | POA: Diagnosis not present

## 2022-03-17 DIAGNOSIS — I129 Hypertensive chronic kidney disease with stage 1 through stage 4 chronic kidney disease, or unspecified chronic kidney disease: Secondary | ICD-10-CM | POA: Insufficient documentation

## 2022-03-17 DIAGNOSIS — R112 Nausea with vomiting, unspecified: Secondary | ICD-10-CM | POA: Insufficient documentation

## 2022-03-17 DIAGNOSIS — W19XXXA Unspecified fall, initial encounter: Secondary | ICD-10-CM | POA: Insufficient documentation

## 2022-03-17 LAB — CBG MONITORING, ED
Glucose-Capillary: 151 mg/dL — ABNORMAL HIGH (ref 70–99)
Glucose-Capillary: 133 mg/dL — ABNORMAL HIGH (ref 70–99)

## 2022-03-17 LAB — BASIC METABOLIC PANEL
Anion gap: 8 (ref 5–15)
BUN: 22 mg/dL (ref 8–23)
CO2: 25 mmol/L (ref 22–32)
Calcium: 10.2 mg/dL (ref 8.9–10.3)
Chloride: 109 mmol/L (ref 98–111)
Creatinine, Ser: 1.16 mg/dL — ABNORMAL HIGH (ref 0.44–1.00)
GFR, Estimated: 52 mL/min — ABNORMAL LOW (ref >60)
Glucose, Bld: 172 mg/dL — ABNORMAL HIGH (ref 70–99)
Potassium: 4.6 mmol/L (ref 3.5–5.1)
Sodium: 142 mmol/L (ref 135–145)

## 2022-03-17 LAB — CBC
HCT: 41.8 % (ref 36.0–46.0)
Hemoglobin: 13.5 g/dL (ref 12.0–15.0)
MCH: 28.8 pg (ref 26.0–34.0)
MCHC: 32.3 g/dL (ref 30.0–36.0)
MCV: 89.3 fL (ref 80.0–100.0)
Platelets: 309 10*3/uL (ref 150–400)
RBC: 4.68 MIL/uL (ref 3.87–5.11)
RDW: 12 % (ref 11.5–15.5)
WBC: 8.5 10*3/uL (ref 4.0–10.5)
nRBC: 0 % (ref 0.0–0.2)

## 2022-03-17 LAB — TROPONIN I (HIGH SENSITIVITY)
Troponin I (High Sensitivity): 3 ng/L (ref <18)
Troponin I (High Sensitivity): 3 ng/L (ref <18)

## 2022-03-17 MED ORDER — BLOOD GLUCOSE MONITOR KIT: PACK | 0 refills | Status: DC

## 2022-03-17 NOTE — ED Triage Notes (Signed)
Presents via EMS from home   Pre EMS she had a near syncopal episode this am  recently  restarted on insulin    Cbg 187

## 2022-03-17 NOTE — ED Provider Triage Note (Signed)
Emergency Medicine Provider Triage Evaluation Note  Kelly Doyle , a 66 y.o. female  was evaluated in triage.  Pt complains of near syncopal episode, was sitting in chair, felt reflux/cp and started to slide to foor.  Review of Systems  Positive:  Negative:   Physical Exam  Ht '5\' 2"'$  (1.575 m)   Wt 68 kg   BMI 27.42 kg/m  Gen:   Awake, no distress   Resp:  Normal effort  MSK:   Moves extremities without difficulty  Other:    Medical Decision Making  Medically screening exam initiated at 12:01 PM.  Appropriate orders placed.  Kelly Doyle was informed that the remainder of the evaluation will be completed by another provider, this initial triage assessment does not replace that evaluation, and the importance of remaining in the ED until their evaluation is complete.     [name], PA-C 03/17/22 1202

## 2022-03-17 NOTE — ED Triage Notes (Signed)
Pt to er, pt states that she wasn't feeling well and started to pass out and fell/ was helped out of a chair.  States that it was an acid reflux like pain that wasn't making her feel well.  Pt denies pain from fall, states that she was helped to the floor

## 2022-03-17 NOTE — ED Provider Notes (Signed)
[facility] Provider Note    Event Date/Time   First MD Initiated Contact with Patient 03/17/22 1549     (approximate)   History   Chief Complaint Weakness and Fall   HPI  Kelly Doyle is a 66 y.o. female with past medical history of hypertension, diabetes, and CKD who presents to the ED complaining of dizziness.  Patient reports that earlier today she had just given herself her dose of insulin when she began to feel dizzy.  She describes a feeling of lightheadedness associated with nausea, denies any associated chest pain or shortness of breath.  She eventually felt like she was going to pass out and had to be lowered to the floor, but never fully lost consciousness.  She denies significant fall and did not hit her head.  She attributes the episode to her dose of insulin, states that she was started back on insulin for the first time in multiple years by her PCP yesterday.  She did not check her blood sugar during the episode, states she does not currently have a glucometer.  She is now feeling back to normal and denies any symptoms.     Physical Exam   Triage Vital Signs: ED Triage Vitals  Enc Vitals Group     BP 03/17/22 1201 138/79     Pulse Rate 03/17/22 1201 95     Resp 03/17/22 1201 17     Temp 03/17/22 1201 97.9 F (36.6 C)     Temp Source 03/17/22 1201 Oral     SpO2 03/17/22 1201 98 %     Weight 03/17/22 1109 149 lb 14.6 oz (68 kg)     Height 03/17/22 1109 '5\' 2"'$  (1.575 m)     Head Circumference --      Peak Flow --      Pain Score 03/17/22 1202 5     Pain Loc --      Pain Edu? --      Excl. in [place]? --     Most recent vital signs: Vitals:   03/17/22 1617 03/17/22 1654  BP:  (!) 160/83  Pulse:  75  Resp:  12  Temp: 97.7 F (36.5 C)   SpO2:  100%    Constitutional: Alert and oriented. Eyes: Conjunctivae are normal. Head: Atraumatic. Nose: No congestion/rhinnorhea. Mouth/Throat: Mucous membranes are moist.  Cardiovascular:  Normal rate, regular rhythm. Grossly normal heart sounds.  2+ radial pulses bilaterally. Respiratory: Normal respiratory effort.  No retractions. Lungs CTAB. Gastrointestinal: Soft and nontender. No distention. Musculoskeletal: No lower extremity tenderness nor edema.  Neurologic:  Normal speech and language. No gross focal neurologic deficits are appreciated.    ED Results / Procedures / Treatments   Labs (all labs ordered are listed, but only abnormal results are displayed) Labs Reviewed  BASIC METABOLIC PANEL - Abnormal; Notable for the following components:      Result Value   Glucose, Bld 172 (*)    Creatinine, Ser 1.16 (*)    GFR, Estimated 52 (*)    All other components within normal limits  CBG MONITORING, ED - Abnormal; Notable for the following components:   Glucose-Capillary 151 (*)    All other components within normal limits  CBC  URINALYSIS, ROUTINE W REFLEX MICROSCOPIC  TROPONIN I (HIGH SENSITIVITY)  TROPONIN I (HIGH SENSITIVITY)     EKG  ED ECG REPORT I, Blake Divine, the attending physician, personally viewed and interpreted this ECG.   Date: 03/17/2022  EKG  Time: 12:06  Rate: 99  Rhythm: normal sinus rhythm, occasional PVC noted, unifocal  Axis: Normal  Intervals:none  ST&T Change: None  PROCEDURES:  Critical Care performed: No  Procedures   MEDICATIONS ORDERED IN ED: Medications - No data to display   IMPRESSION / MDM / [place] / ED COURSE  I reviewed the triage vital signs and the nursing notes.                              66 y.o. female with past medical history of hypertension, diabetes, and CKD presents to the ED complaining of episode of nausea, vomiting, and dizziness earlier today that has since resolved.  Patient's presentation is most consistent with acute presentation with potential threat to life or bodily function.  Differential diagnosis includes, but is not limited to, hypoglycemia, arrhythmia, ACS,  dehydration, electrolyte abnormality, AKI, anemia.  Patient well-appearing and in no acute distress, vital signs are unremarkable.  EKG shows no evidence of arrhythmia or ischemia and low suspicion for cardiac etiology for her episode.  It appears that she is taking long-acting insulin only and I have low suspicion for acute hypoglycemic episode causing her symptoms.  With her nausea and vomiting, she may have had a vasovagal episode contributing to her near syncope.  Labs thus far are reassuring and show stable chronic kidney disease with no acute electrolyte abnormality, no significant anemia or leukocytosis noted, troponin within normal limits.  We will observe on cardiac monitor and check second set troponin, but if this is unremarkable patient would be appropriate for discharge home with PCP follow-up.  Repeat troponin within normal limits and no events noted on cardiac monitor.  Recheck of CBG is 133 and patient remains asymptomatic here in the ED.  She is appropriate for discharge home, states she has a follow-up appointment scheduled with her PCP tomorrow.  We will provide prescription for glucometer at patient's request and she was counseled to regularly check her blood glucose before administering insulin.  She was counseled to return to the ED for new or worsening symptoms.  Patient agrees with plan.      FINAL CLINICAL IMPRESSION(S) / ED DIAGNOSES   Final diagnoses:  Near syncope  Nausea and vomiting, unspecified vomiting type     Rx / DC Orders   ED Discharge Orders     None        Note:  This document was prepared using Dragon voice recognition software and may include unintentional dictation errors.   Blake Divine, MD 03/17/22 [phone]

## 2022-03-20 ENCOUNTER — Telehealth: Payer: Self-pay | Admitting: Emergency Medicine

## 2022-03-20 MED ORDER — BLOOD GLUCOSE METER KIT: PACK | 0 refills | Status: DC

## 2022-03-20 NOTE — Telephone Encounter (Signed)
Sent glucose testing supplies to [facility]

## 2022-03-30 ENCOUNTER — Other Ambulatory Visit: Payer: Self-pay | Admitting: Nurse Practitioner

## 2022-03-30 DIAGNOSIS — Z1231 Encounter for screening mammogram for malignant neoplasm of breast: Secondary | ICD-10-CM

## 2022-05-14 ENCOUNTER — Ambulatory Visit
Admission: RE | Admit: 2022-05-14 | Discharge: 2022-05-14 | Disposition: A | Discharge status: Home / Self Care | Payer: Medicare HMO | Source: Ambulatory Visit | Attending: Nurse Practitioner | Admitting: Nurse Practitioner

## 2022-05-14 DIAGNOSIS — Z1231 Encounter for screening mammogram for malignant neoplasm of breast: Secondary | ICD-10-CM | POA: Diagnosis present

## 2023-11-01 ENCOUNTER — Emergency Department
Admission: EM | Admit: 2023-11-01 | Discharge: 2023-11-02 | Disposition: A | Discharge status: Other Acute Inpatient Hospital

## 2023-11-01 ENCOUNTER — Other Ambulatory Visit: Payer: Self-pay

## 2023-11-01 DIAGNOSIS — E119 Type 2 diabetes mellitus without complications: Secondary | ICD-10-CM | POA: Insufficient documentation

## 2023-11-01 DIAGNOSIS — I10 Essential (primary) hypertension: Secondary | ICD-10-CM | POA: Insufficient documentation

## 2023-11-01 DIAGNOSIS — F332 Major depressive disorder, recurrent severe without psychotic features: Secondary | ICD-10-CM | POA: Insufficient documentation

## 2023-11-01 DIAGNOSIS — R45851 Suicidal ideations: Secondary | ICD-10-CM | POA: Insufficient documentation

## 2023-11-01 LAB — COMPREHENSIVE METABOLIC PANEL WITH GFR
ALT: 18 U/L (ref 0–44)
AST: 18 U/L (ref 15–41)
Albumin: 3.8 g/dL (ref 3.5–5.0)
Alkaline Phosphatase: 70 U/L (ref 38–126)
Anion gap: 7 (ref 5–15)
BUN: 24 mg/dL — ABNORMAL HIGH (ref 8–23)
CO2: 27 mmol/L (ref 22–32)
Calcium: 9.5 mg/dL (ref 8.9–10.3)
Chloride: 109 mmol/L (ref 98–111)
Creatinine, Ser: 1.36 mg/dL — ABNORMAL HIGH (ref 0.44–1.00)
GFR, Estimated: 42 mL/min — ABNORMAL LOW (ref >60)
Glucose, Bld: 93 mg/dL (ref 70–99)
Potassium: 4.1 mmol/L (ref 3.5–5.1)
Sodium: 143 mmol/L (ref 135–145)
Total Bilirubin: 0.3 mg/dL (ref 0.0–1.2)
Total Protein: 7.3 g/dL (ref 6.5–8.1)

## 2023-11-01 LAB — CBC
HCT: 39.9 % (ref 36.0–46.0)
Hemoglobin: 12.6 g/dL (ref 12.0–15.0)
MCH: 30 pg (ref 26.0–34.0)
MCHC: 31.6 g/dL (ref 30.0–36.0)
MCV: 95 fL (ref 80.0–100.0)
Platelets: 282 K/uL (ref 150–400)
RBC: 4.2 MIL/uL (ref 3.87–5.11)
RDW: 12.6 % (ref 11.5–15.5)
WBC: 7.9 K/uL (ref 4.0–10.5)
nRBC: 0 % (ref 0.0–0.2)

## 2023-11-01 LAB — ETHANOL: Alcohol, Ethyl (B): <15 mg/dL (ref <15)

## 2023-11-01 NOTE — ED Notes (Addendum)
 Patient belongings: Psychologist, counselling [employer] pocketbook with various contents including;  Wallet, haircare, debit card, Bag with various home medications was taken home by daughter

## 2023-11-01 NOTE — ED Provider Notes (Signed)
 [facility] Provider Note    None    (approximate)   History   Suicidal  Patient brought in via family to ER due to suicidal thoughts and actions. Family stated she attempted to kill herself today and they had to remove a knife from her. Patient states she no longer wants to live, states she has felt this way for 3 days.   HPI Kelly Doyle is a 68 y.o. female PMH MDD, diabetes, hypertension, hyperlipidemia, anxiety presents for evaluation of suicidal thoughts -Patient had been thinking about killing herself recently, got frustrated in an argument this evening and reportedly picked up a knife and was about to stab herself in the stomach.  Her daughter fortunately stopped her.  - Patient denies any substance use or ingestions -Does report a remote history of hurting herself though details are unclear, says this was during an episode of severe depression -Denies any HI or hallucinations     Physical Exam   Triage Vital Signs: ED Triage Vitals [11/01/23 2217]  Encounter Vitals Group     BP (!) 136/100     Girls Systolic BP Percentile      Girls Diastolic BP Percentile      Boys Systolic BP Percentile      Boys Diastolic BP Percentile      Pulse Rate 94     Resp 18     Temp 97.7 F (36.5 C)     Temp Source Oral     SpO2 98 %     Weight 149 lb 14.6 oz (68 kg)     Height 5' 2 (1.575 m)     Head Circumference      Peak Flow      Pain Score 0     Pain Loc      Pain Education      Exclude from Growth Chart     Most recent vital signs: Vitals:   11/01/23 2217  BP: (!) 136/100  Pulse: 94  Resp: 18  Temp: 97.7 F (36.5 C)  SpO2: 98%     General: Awake, no distress.  CV:  Good peripheral perfusion. RRR, RP 2+ Resp:  Normal effort. CTAB Abd:  No distention. Nontender to deep palpation throughout.  No wounds on abdominal skin exam. Psych:  SI with plan, denies HI, hallucinations.  Calm, cooperative.  Responds all questions  appropriately.   ED Results / Procedures / Treatments   Labs (all labs ordered are listed, but only abnormal results are displayed) Labs Reviewed  COMPREHENSIVE METABOLIC PANEL WITH GFR - Abnormal; Notable for the following components:      Result Value   BUN 24 (*)    Creatinine, Ser 1.36 (*)    GFR, Estimated 42 (*)    All other components within normal limits  CBC  ETHANOL  URINE DRUG SCREEN, QUALITATIVE ([facility] ONLY)  SALICYLATE LEVEL  ACETAMINOPHEN  LEVEL     EKG  N/a   RADIOLOGY N/a    PROCEDURES:  Critical Care performed: No  Procedures   MEDICATIONS ORDERED IN ED: Medications - No data to display   IMPRESSION / MDM / ASSESSMENT AND PLAN / ED COURSE  I reviewed the triage vital signs and the nursing notes.                              DDX/MDM/AP: Differential diagnosis includes, but is not limited to, primary psychiatric disorder, do  not suspect underlying organic etiology, consider concomitant substance use.  For psychiatry consultation.  Patient is calm, cooperative, amenable to speaking with psychiatry--will defer IVC at this time.  Plan: - Labs - Psychiatry consult   Patient's presentation is most consistent with acute presentation with potential threat to life or bodily function.   ED course below.  Initial laboratory evaluation unremarkable, UDS, salicylate, Tylenol , ethanol pending.  Signed out to oncoming ED provider pending psychiatric evaluation and further lab results.      FINAL CLINICAL IMPRESSION(S) / ED DIAGNOSES   Final diagnoses:  Suicidal ideation     Rx / DC Orders   ED Discharge Orders     None        Note:  This document was prepared using Dragon voice recognition software and may include unintentional dictation errors.   Clarine Ozell LABOR, MD 11/01/23 2253

## 2023-11-01 NOTE — ED Notes (Signed)
 Introduced self to pt. Explained the initial psych process. Pt denied SI/HI at this time. Pt calm and resting.

## 2023-11-01 NOTE — ED Triage Notes (Signed)
 Patient brought in via family to ER due to suicidal thoughts and actions. Family stated she attempted to kill herself today and they had to remove a knife from her. Patient states she no longer wants to live, states she has felt this way for 3 days.

## 2023-11-02 ENCOUNTER — Inpatient Hospital Stay
Admission: AD | Admit: 2023-11-02 | Discharge: 2023-11-09 | DRG: 885 | Disposition: A | Discharge status: Home / Self Care | Source: Intra-hospital | Attending: Psychiatry | Admitting: Psychiatry

## 2023-11-02 DIAGNOSIS — I129 Hypertensive chronic kidney disease with stage 1 through stage 4 chronic kidney disease, or unspecified chronic kidney disease: Secondary | ICD-10-CM | POA: Diagnosis present

## 2023-11-02 DIAGNOSIS — F332 Major depressive disorder, recurrent severe without psychotic features: Principal | ICD-10-CM | POA: Diagnosis present

## 2023-11-02 DIAGNOSIS — E785 Hyperlipidemia, unspecified: Secondary | ICD-10-CM | POA: Diagnosis present

## 2023-11-02 DIAGNOSIS — K219 Gastro-esophageal reflux disease without esophagitis: Secondary | ICD-10-CM | POA: Diagnosis present

## 2023-11-02 DIAGNOSIS — N183 Chronic kidney disease, stage 3 unspecified: Secondary | ICD-10-CM | POA: Diagnosis present

## 2023-11-02 DIAGNOSIS — Z608 Other problems related to social environment: Secondary | ICD-10-CM | POA: Diagnosis present

## 2023-11-02 DIAGNOSIS — R45851 Suicidal ideations: Secondary | ICD-10-CM | POA: Diagnosis present

## 2023-11-02 DIAGNOSIS — F419 Anxiety disorder, unspecified: Secondary | ICD-10-CM | POA: Diagnosis present

## 2023-11-02 DIAGNOSIS — Z7984 Long term (current) use of oral hypoglycemic drugs: Secondary | ICD-10-CM | POA: Diagnosis not present

## 2023-11-02 DIAGNOSIS — E1122 Type 2 diabetes mellitus with diabetic chronic kidney disease: Secondary | ICD-10-CM | POA: Diagnosis present

## 2023-11-02 DIAGNOSIS — Z5900 Homelessness unspecified: Secondary | ICD-10-CM | POA: Diagnosis not present

## 2023-11-02 DIAGNOSIS — Z794 Long term (current) use of insulin: Secondary | ICD-10-CM

## 2023-11-02 LAB — URINE DRUG SCREEN, QUALITATIVE (ARMC ONLY)
Amphetamines, Ur Screen: NOT DETECTED
Barbiturates, Ur Screen: NOT DETECTED
Benzodiazepine, Ur Scrn: NOT DETECTED
Cannabinoid 50 Ng, Ur ~~LOC~~: NOT DETECTED
Cocaine Metabolite,Ur ~~LOC~~: NOT DETECTED
MDMA (Ecstasy)Ur Screen: NOT DETECTED
Methadone Scn, Ur: NOT DETECTED
Opiate, Ur Screen: NOT DETECTED
Phencyclidine (PCP) Ur S: NOT DETECTED
Tricyclic, Ur Screen: NOT DETECTED

## 2023-11-02 LAB — ACETAMINOPHEN LEVEL: Acetaminophen (Tylenol), Serum: <10 ug/mL — ABNORMAL LOW (ref 10–30)

## 2023-11-02 LAB — SARS CORONAVIRUS 2 BY RT PCR: SARS Coronavirus 2 by RT PCR: NEGATIVE

## 2023-11-02 LAB — SALICYLATE LEVEL: Salicylate Lvl: <7 mg/dL — ABNORMAL LOW (ref 7.0–30.0)

## 2023-11-02 MED ORDER — ALUM & MAG HYDROXIDE-SIMETH 200-200-20 MG/5ML PO SUSP
30.0000 mL | ORAL | Status: DC | PRN
  Administered 2023-11-07: 30 mL via ORAL
  Filled 2023-11-02: qty 30

## 2023-11-02 MED ORDER — MAGNESIUM HYDROXIDE 400 MG/5ML PO SUSP
30.0000 mL | Freq: Every day | ORAL | Status: DC | PRN
Start: 2023-11-02 — End: 2023-11-09

## 2023-11-02 MED ORDER — OLANZAPINE 5 MG PO TBDP: 5.0000 mg | ORAL_TABLET | Freq: Three times a day (TID) | ORAL | Status: DC | PRN

## 2023-11-02 MED ORDER — HYDROXYZINE HCL 25 MG PO TABS
25.0000 mg | ORAL_TABLET | Freq: Four times a day (QID) | ORAL | Status: DC | PRN
  Administered 2023-11-04 – 2023-11-05 (×2): 25 mg via ORAL
  Filled 2023-11-02 (×2): qty 1

## 2023-11-02 MED ORDER — TRAZODONE HCL 50 MG PO TABS
50.0000 mg | ORAL_TABLET | Freq: Every evening | ORAL | Status: DC | PRN
  Administered 2023-11-02 – 2023-11-05 (×4): 50 mg via ORAL
  Filled 2023-11-02 (×4): qty 1

## 2023-11-02 MED ORDER — OLANZAPINE 10 MG IM SOLR: 5.0000 mg | Freq: Three times a day (TID) | INTRAMUSCULAR | Status: DC | PRN

## 2023-11-02 MED ORDER — ACETAMINOPHEN 325 MG PO TABS: 650.0000 mg | ORAL_TABLET | Freq: Four times a day (QID) | ORAL | Status: DC | PRN

## 2023-11-02 NOTE — ED Notes (Signed)
 Dinner tray given

## 2023-11-02 NOTE — ED Notes (Signed)
 Report given to [facility]. Patient to be transferred to the Geropsych unit.  Patient aware of plan of care and is agreeable with plan.  Patient has signed consent, and is uploading into chart.

## 2023-11-02 NOTE — BH Assessment (Signed)
 Comprehensive Clinical Assessment (CCA) Screening, Triage and Referral Note  11/02/2023 Kelly Doyle [id number] Kelly Doyle is a 68 year old [place], English speaking female. Pt presented to [facility] ED voluntarily. Per triage note: Patient brought in via family to ER due to suicidal thoughts and actions. Family stated she attempted to kill herself today and they had to remove a knife from her. Patient states she no longer wants to live, states she has felt this way for 3 days  The patient appeared visibly depressed during the assessment. She was expansive when discussing her reasons for presenting to the hospital, reporting persistent sadness and intermittent passive suicidal ideation (SI) related to family conflict and feeling exiled by her children. She denied active suicidal or homicidal ideation (HI) and denied experiencing hallucinations (AV/H). The patient described feelings of hopelessness, loneliness, and anhedonia. She reported unstable housing and a lack of support from family, expressing that her children want nothing to do with her. The patient shared that she isolates due to social anxiety and feels unsupported and alone following the loss of many family members. She continued to perseverate on the belief that there is no purpose for her being here. Despite these challenges, the patient denied any current substance use and reported no history of drug or alcohol abuse. Risk Assessment: Passive SI endorsed; no active plan or intent reported. No current HI or AV/H. Safety plan reviewed and patient encouraged to engage in ongoing support. Clinical Impressions: Major depressive symptoms with passive SI. Social isolation and unresolved grief. Housing instability and lack of familial support contributing to emotional distress. Poor insight and judgment noted. Chief Complaint:  Chief Complaint  Patient presents with   Suicidal   Visit Diagnosis: MDD recurrent, severe without  psychosis  Patient Reported Information How did you hear about us ? Family/Friend  What Is the Reason for Your Visit/Call Today? Patient brought in via family to ER due to suicidal thoughts and actions. Family stated she attempted to kill herself today and they had to remove a knife from her. Patient states she no longer wants to live, states she has felt this way for 3 days  How Long Has This Been Causing You Problems? > than 6 months  What Do You Feel Would Help You the Most Today? Treatment for Depression or other mood problem; Social Support   Have You Recently Had Any Thoughts About Hurting Yourself? Yes  Are You Planning to Commit Suicide/Harm Yourself At This time? No   Have you Recently Had Thoughts About Hurting Someone Sher[place]? No  Are You Planning to Harm Someone at This Time? No  Explanation: Pt endorsed having passive SI stating, What is my purpose for being around? Why am I here? That's why I'd be better off dead.   Have You Used Any Alcohol or Drugs in the Past 24 Hours? No  How Long Ago Did You Use Drugs or Alcohol? No data recorded What Did You Use and How Much? No data recorded  Do You Currently Have a Therapist/Psychiatrist? No  Name of Therapist/Psychiatrist: No data recorded  Have You Been Recently Discharged From Any Office Practice or Programs? No  Explanation of Discharge From Practice/Program: No data recorded   CCA Screening Triage Referral Assessment Type of Contact: Face-to-Face  Telemedicine Service Delivery:   Is this Initial or Reassessment?   Date Telepsych consult ordered in [facility]:    Time Telepsych consult ordered in [facility]:    Location of Assessment: [facility] ED  Provider Location: [facility] ED  Collateral Involvement: None provided   Does Patient Have a Automotive engineer Guardian? No data recorded Name and Contact of Legal Guardian: No data recorded If Minor and Not Living with Parent(s), Who has Custody? n/a  Is CPS involved or ever  been involved? Never  Is APS involved or ever been involved? Never   Patient Determined To Be At Risk for Harm To Self or Others Based on Review of Patient Reported Information or Presenting Complaint? No  Method: No Plan  Availability of Means: Has close by  Intent: Vague intent or NA  Notification Required: No need or identified person  Additional Information for Danger to Others Potential: -- (n/a)  Additional Comments for Danger to Others Potential: n/a  Are There Guns or Other Weapons in Your Home? Yes  Types of Guns/Weapons: Knives  Are These Weapons Safely Secured?                            -- (n/a)  Who Could Verify You Are Able To Have These Secured: n/a  Do You Have any Outstanding Charges, Pending Court Dates, Parole/Probation? None reported  Contacted To Inform of Risk of Harm To Self or Others: -- (n/a)   Does Patient Present under Involuntary Commitment? No    County of Residence: [place]   Patient Currently Receiving the Following Services: Not Receiving Services   Determination of Need: Emergent (2 hours)   Options For Referral: ED Visit   Disposition Recommendation per psychiatric provider:   Ailee Pates R Wessley Emert, LCAS

## 2023-11-02 NOTE — ED Notes (Signed)
 RN attempting to call report at this time. Inpt team unable to take report at this time and states we will be ready for pt after shift change. Charge made aware.

## 2023-11-02 NOTE — ED Notes (Signed)
VOL/Psych Consult Ordered/Pending

## 2023-11-02 NOTE — Consult Note (Signed)
 [facility] Consult Note  Patient Name: Kelly Doyle MRN: [id number] DOB: 05-06-1955 DATE OF Consult: 11/02/2023  PRIMARY PSYCHIATRIC DIAGNOSES  1.  Major depressive disorder recurrent severe without psychotic features  RECOMMENDATIONS  Recommendations: Medication recommendations: Zoloft  25 mg p.o. daily for depression Non-Medication/therapeutic recommendations: Recent suicidal gesture, depression and suicidal ideation Is inpatient psychiatric hospitalization recommended for this patient? Yes (Explain why): Recent suicidal gesture, depression and suicidal ideation Is another care setting recommended for this patient? (examples may include Crisis Stabilization Unit, Residential/Recovery Treatment, ALF/SNF, Memory Care Unit)  No (Explain why): Recent suicidal gesture, depression and suicidal ideation From a psychiatric perspective, is this patient appropriate for discharge to an outpatient setting/resource or other less restrictive environment for continued care?  No (Explain why): Recent suicidal gesture, depression and suicidal ideation Follow-Up Telepsychiatry C/L services: We will sign off for now. Please re-consult our service if needed for any concerning changes in the patient's condition, discharge planning, or questions. Communication: Treatment team members (and family members if applicable) who were involved in treatment/care discussions and planning, and with whom we spoke or engaged with via secure text/chat, include the following: Augustin Leonor CROME, RN  Thank you for involving us  in the care of this patient. If you have any additional questions or concerns, please call [phone] and ask for me or the provider on-call.  TELEPSYCHIATRY ATTESTATION & CONSENT  As the provider for this telehealth consult, I attest that I verified the patient's identity using two separate identifiers, introduced myself to the patient, provided my credentials, disclosed my location, and performed  this encounter via a HIPAA-compliant, real-time, face-to-face, two-way, interactive audio and video platform and with the full consent and agreement of the patient (or guardian as applicable.)  Patient physical location: [place]. Telehealth provider physical location: home office in state of [place].  Video start time: 1100 AM (Central Time) Video end time: 1120 AM (Central Time)  IDENTIFYING DATA  Kelly Doyle is a 67 y.o. year-old female for whom a psychiatric consultation has been ordered by the primary provider. The patient was identified using two separate identifiers.  CHIEF COMPLAINT/REASON FOR CONSULT  Depression, suicidal ideation/gesture  HISTORY OF PRESENT ILLNESS (HPI)  Psych consult requested for evaluation of 67 year old female because of worsening depression and suicidal ideation.  Patient was brought to the ER on 7/14 by family, reportedly attempted to stab herself with a knife, daughter was able to take a knife from her.  History of depression anxiety not currently on treatment.  During evaluation patient is alert oriented calm cooperative.  Reports she has been feeling depressed and suicidal especially for the past 3 days.  Reports stressors are related to feeling isolated, her friends are deceased or moved away, her children and not supportive, housing issue, does not have a place to stay.  Reports she was living with her grandson, was paying him $300 a month but his girlfriend does not want her there, she left and went to her daughter but there are too many people at the house and she did not want her there, slept in the park to past couple nights, went to the shelter but there was not any room available.  Reports feeling depressed, tired of living and wanted to stab herself to death but her daughter took the knife from her.    Reports only her son is supportive but he is currently denies substance use rehab in [place] is supposed to be out this July, they were hoping to get an  apartment together.  Patient denies substance use.  Denies perceptual disturbances no delusion elicited.  Reports she was previously on some medications for depression and anxiety, unable to recall the name.  Requesting inpatient psychiatric stabilization and would like to resume treatment with antidepressant.  PAST PSYCHIATRIC HISTORY   Otherwise as per HPI above.  PAST MEDICAL HISTORY  Past Medical History:  Diagnosis Date   Anxiety    Arthritis    Diabetes mellitus without complication (HCC)    GERD (gastroesophageal reflux disease)    Hypertension      HOME MEDICATIONS  PTA Medications  Medication Sig   atorvastatin  (LIPITOR) 40 MG tablet Take 1 tablet (40 mg total) by mouth daily.   cetirizine  (ZYRTEC  ALLERGY) 10 MG tablet Take 1 tablet (10 mg total) by mouth daily.   ferrous sulfate  325 (65 FE) MG tablet Take 1 tablet (325 mg total) by mouth daily with breakfast.   lisinopril  (ZESTRIL ) 40 MG tablet Take 1 tablet (40 mg total) by mouth daily.   amLODipine  (NORVASC ) 10 MG tablet Take 1 tablet (10 mg total) by mouth daily.   JARDIANCE  25 MG TABS tablet Take 25 mg by mouth daily.   LANTUS SOLOSTAR 100 UNIT/ML Solostar Pen :30 Unit(s) SUB-Q Daily   venlafaxine XR (EFFEXOR-XR) 150 MG 24 hr capsule Take 150 mg by mouth daily.   BD PEN NEEDLE NANO 2ND GEN 32G X 4 MM MISC 3 (three) times daily. :3 Times Daily   omeprazole (PRILOSEC) 20 MG capsule Take 20 mg by mouth 2 (two) times daily before a meal.  (Patient not taking: Reported on 11/02/2023)   VICTOZA 18 MG/3ML SOPN Inject into the skin. (Patient not taking: Reported on 11/02/2023)   FLUoxetine (PROZAC) 40 MG capsule Take 40 mg by mouth daily. (Patient not taking: Reported on 11/02/2023)   acetaminophen  (TYLENOL ) 650 MG CR tablet SMARTSIG:1-2 Tablet(s) By Mouth Every 8 Hours PRN (Patient not taking: Reported on 11/02/2023)   hydrOXYzine  (ATARAX ) 25 MG tablet Take 25 mg by mouth daily. (Patient not taking: Reported on 11/02/2023)   blood  glucose meter kit and supplies KIT Dispense based on patient and insurance preference. Use up to four times daily as directed.   blood glucose meter kit and supplies Dispense based on patient and insurance preference. Use up to four times daily as directed. (FOR ICD-10 E10.9, E11.9).     ALLERGIES  No Known Allergies  SOCIAL & SUBSTANCE USE HISTORY  Social History   Socioeconomic History   Marital status: Single    Spouse name: Not on file   Number of children: Not on file   Years of education: Not on file   Highest education level: Not on file  Occupational History   Not on file  Tobacco Use   Smoking status: Never   Smokeless tobacco: Never  Vaping Use   Vaping status: Never Used  Substance and Sexual Activity   Alcohol use: No   Drug use: No   Sexual activity: Yes    Birth control/protection: None  Other Topics Concern   Not on file  Social History Narrative   Not on file   Social Drivers of Health   Financial Resource Strain: Not on file  Food Insecurity: Not on file  Transportation Needs: Not on file  Physical Activity: Not on file  Stress: Not on file  Social Connections: Not on file   Social History   Tobacco Use  Smoking Status Never  Smokeless Tobacco Never   Social  History   Substance and Sexual Activity  Alcohol Use No   Social History   Substance and Sexual Activity  Drug Use No    Additional pertinent information .  FAMILY HISTORY  Family History  Problem Relation Age of Onset   Alzheimer's disease Mother    Hypertension Mother    Diabetes Sister    Gout Brother    Family Psychiatric History (if known):    MENTAL STATUS EXAM (MSE)  Mental Status Exam: General Appearance: Fairly Groomed  Orientation:  Full (Time, Place, and Person)  Memory:  Immediate;   Fair Recent;   Fair Remote;   Fair  Concentration:  Concentration: Fair and Attention Span: Fair  Recall:  Fair  Attention  Fair  Eye Contact:  Fair  Speech:  Clear and  Coherent  Language:  Fair  Volume:  Normal  Mood: Depressed  Affect:  Depressed  Thought Process:  Coherent  Thought Content:  Logical  Suicidal Thoughts:   suicidal ideation, recent suicide gesture  Homicidal Thoughts:  No  Judgement:  Impaired  Insight:  Fair  Psychomotor Activity:  Normal  Akathisia:  No  Fund of Knowledge:  Fair    Assets:  Manufacturing systems engineer Desire for Improvement  Cognition:  WNL  ADL's:  Intact  AIMS (if indicated):       VITALS  Blood pressure 134/72, pulse 94, temperature (!) 97.5 F (36.4 C), temperature source Oral, resp. rate 16, height 5' 2 (1.575 m), weight 68 kg, SpO2 96%.  LABS  Admission on 11/01/2023  Component Date Value Ref Range Status   Sodium 11/01/2023 143  135 - 145 mmol/L Final   Potassium 11/01/2023 4.1  3.5 - 5.1 mmol/L Final   Chloride 11/01/2023 109  98 - 111 mmol/L Final   CO2 11/01/2023 27  22 - 32 mmol/L Final   Glucose, Bld 11/01/2023 93  70 - 99 mg/dL Final   Glucose reference range applies only to samples taken after fasting for at least 8 hours.   BUN 11/01/2023 24 (H)  8 - 23 mg/dL Final   Creatinine, Ser 11/01/2023 1.36 (H)  0.44 - 1.00 mg/dL Final   Calcium  11/01/2023 9.5  8.9 - 10.3 mg/dL Final   Total Protein 92/85/7974 7.3  6.5 - 8.1 g/dL Final   Albumin 92/85/7974 3.8  3.5 - 5.0 g/dL Final   AST 92/85/7974 18  15 - 41 U/L Final   ALT 11/01/2023 18  0 - 44 U/L Final   Alkaline Phosphatase 11/01/2023 70  38 - 126 U/L Final   Total Bilirubin 11/01/2023 0.3  0.0 - 1.2 mg/dL Final   GFR, Estimated 11/01/2023 42 (L)  >60 mL/min Final   Comment: (NOTE) Calculated using the CKD-EPI Creatinine Equation (2021)    Anion gap 11/01/2023 7  5 - 15 Final   Performed at [facility], [address] [place], [place] [address]   Alcohol, Ethyl (B) 11/01/2023 <15  <15 mg/dL Final   Comment: (NOTE) For medical purposes only. Performed at [facility], [address] [place], [place] [address]     WBC 11/01/2023 7.9  4.0 - 10.5 K/uL Final   RBC 11/01/2023 4.20  3.87 - 5.11 MIL/uL Final   Hemoglobin 11/01/2023 12.6  12.0 - 15.0 g/dL Final   HCT 92/85/7974 39.9  36.0 - 46.0 % Final   MCV 11/01/2023 95.0  80.0 - 100.0 fL Final   MCH 11/01/2023 30.0  26.0 - 34.0 pg Final   MCHC  11/01/2023 31.6  30.0 - 36.0 g/dL Final   RDW 92/85/7974 12.6  11.5 - 15.5 % Final   Platelets 11/01/2023 282  150 - 400 K/uL Final   nRBC 11/01/2023 0.0  0.0 - 0.2 % Final   Performed at [facility], [address] [place], [place] [address]   Tricyclic, Ur Screen 11/02/2023 NONE DETECTED  NONE DETECTED Final   Amphetamines, Ur Screen 11/02/2023 NONE DETECTED  NONE DETECTED Final   MDMA (Ecstasy)Ur Screen 11/02/2023 NONE DETECTED  NONE DETECTED Final   Cocaine Metabolite,Ur [place] 11/02/2023 NONE DETECTED  NONE DETECTED Final   Opiate, Ur Screen 11/02/2023 NONE DETECTED  NONE DETECTED Final   Phencyclidine (PCP) Ur S 11/02/2023 NONE DETECTED  NONE DETECTED Final   Cannabinoid 50 Ng, Ur [place] 11/02/2023 NONE DETECTED  NONE DETECTED Final   Barbiturates, Ur Screen 11/02/2023 NONE DETECTED  NONE DETECTED Final   Benzodiazepine, Ur Scrn 11/02/2023 NONE DETECTED  NONE DETECTED Final   Methadone Scn, Ur 11/02/2023 NONE DETECTED  NONE DETECTED Final   Comment: (NOTE) Tricyclics + metabolites, urine    Cutoff 1000 ng/mL Amphetamines + metabolites, urine  Cutoff 1000 ng/mL MDMA (Ecstasy), urine              Cutoff 500 ng/mL Cocaine Metabolite, urine          Cutoff 300 ng/mL Opiate + metabolites, urine        Cutoff 300 ng/mL Phencyclidine (PCP), urine         Cutoff 25 ng/mL Cannabinoid, urine                 Cutoff 50 ng/mL Barbiturates + metabolites, urine  Cutoff 200 ng/mL Benzodiazepine, urine              Cutoff 200 ng/mL Methadone, urine                   Cutoff 300 ng/mL  The urine drug screen provides only a preliminary, unconfirmed analytical test result and should not be used for  non-medical purposes. Clinical consideration and professional judgment should be applied to any positive drug screen result due to possible interfering substances. A more specific alternate chemical method must be used in order to obtain a confirmed analytical result. Gas chromatography / mass spectrometry (GC/MS) is the preferred confirm                          atory method. Performed at [facility], [address] [place], [place] [address]    Salicylate Lvl 11/01/2023 <7.0 (L)  7.0 - 30.0 mg/dL Final   Performed at [facility], [address] [place], [place] [address]   Acetaminophen  (Tylenol ), Serum 11/01/2023 <10 (L)  10 - 30 ug/mL Final   Comment: (NOTE) Therapeutic concentrations vary significantly. A range of 10-30 ug/mL  may be an effective concentration for many patients. However, some  are best treated at concentrations outside of this range. Acetaminophen  concentrations >150 ug/mL at 4 hours after ingestion  and >50 ug/mL at 12 hours after ingestion are often associated with  toxic reactions.  Performed at [facility], [address] [place], [place] [address]     PSYCHIATRIC REVIEW OF SYSTEMS (ROS)  ROS: Notable for the following relevant positive findings: ROS  Additional findings:      Musculoskeletal: No abnormal movements observed      Gait & Station: Laying/Sitting      Pain  Screening: Denies      Nutrition & Dental Concerns: none  RISK FORMULATION/ASSESSMENT  Is the patient experiencing any suicidal or homicidal ideations: Yes       Explain if yes: Suicidal ideation, recent suicidal gesture Protective factors considered for safety management: In hospital, constant observation  Risk factors/concerns considered for safety management:  Depression Age over 56 Hopelessness Isolation Barriers to accessing treatment  Is there a safety management plan with the patient and treatment team to minimize risk factors and  promote protective factors: Yes           Explain: Med management, inpatient psychiatric stabilization Is crisis care placement or psychiatric hospitalization recommended: Yes     Based on my current evaluation and risk assessment, patient is determined at this time to be at:  High risk  *RISK ASSESSMENT Risk assessment is a dynamic process; it is possible that this patient's condition, and risk level, may change. This should be re-evaluated and managed over time as appropriate. Please re-consult psychiatric consult services if additional assistance is needed in terms of risk assessment and management. If your team decides to discharge this patient, please advise the patient how to best access emergency psychiatric services, or to call 911, if their condition worsens or they feel unsafe in any way.   Keyshia Orwick D Leron Stoffers, MD Telepsychiatry Consult Services

## 2023-11-02 NOTE — ED Provider Notes (Signed)
 Emergency Medicine Observation Re-evaluation Note   BP (!) 136/100   Pulse 94   Temp 97.7 F (36.5 C) (Oral)   Resp 18   Ht 5' 2 (1.575 m)   Wt 68 kg   SpO2 98%   BMI 27.42 kg/m    ED Course / MDM   No reported events during my shift at the time of this note.   Pt is awaiting dispo from consultants   Ginnie Shams MD    Shams Ginnie, MD 11/02/23 [phone]

## 2023-11-02 NOTE — ED Notes (Signed)
 VOL moved to [facility]  pending consult

## 2023-11-02 NOTE — BH Assessment (Signed)
 Patient is to be admitted to [facility] Unit on today 11/02/23 by Dr. Jadapalle.  Attending Physician will be Dr. Jadapalle.   Patient has been assigned to room L 31, by [facility] Charge Nurse, Amy.    ER staff is aware of the admission: Olam, ER Secretary   Dr. Jossie, ER MD  Leonor, Patient's Nurse  Curt[place], Patient Access.

## 2023-11-02 NOTE — ED Notes (Signed)
 VOL/CONSULT ORDERED/STILL PENDING

## 2023-11-02 NOTE — ED Notes (Signed)
 Pt on Tele visit at this time.

## 2023-11-03 ENCOUNTER — Encounter: Payer: Self-pay | Admitting: Psychiatry

## 2023-11-03 DIAGNOSIS — F332 Major depressive disorder, recurrent severe without psychotic features: Secondary | ICD-10-CM | POA: Diagnosis not present

## 2023-11-03 LAB — GLUCOSE, CAPILLARY
Glucose-Capillary: 157 mg/dL — ABNORMAL HIGH (ref 70–99)
Glucose-Capillary: 200 mg/dL — ABNORMAL HIGH (ref 70–99)
Glucose-Capillary: 195 mg/dL — ABNORMAL HIGH (ref 70–99)
Glucose-Capillary: 227 mg/dL — ABNORMAL HIGH (ref 70–99)

## 2023-11-03 MED ORDER — ATORVASTATIN CALCIUM 10 MG PO TABS
40.0000 mg | ORAL_TABLET | Freq: Every day | ORAL | Status: DC
  Administered 2023-11-03 – 2023-11-09 (×7): 40 mg via ORAL
  Filled 2023-11-03 (×7): qty 4

## 2023-11-03 MED ORDER — FERROUS SULFATE 325 (65 FE) MG PO TABS
325.0000 mg | ORAL_TABLET | Freq: Every day | ORAL | Status: DC
  Administered 2023-11-04 – 2023-11-09 (×6): 325 mg via ORAL
  Filled 2023-11-03 (×6): qty 1

## 2023-11-03 MED ORDER — PANTOPRAZOLE SODIUM 40 MG PO TBEC
40.0000 mg | DELAYED_RELEASE_TABLET | Freq: Every day | ORAL | Status: DC
  Administered 2023-11-03 – 2023-11-09 (×6): 40 mg via ORAL
  Filled 2023-11-03 (×6): qty 1

## 2023-11-03 MED ORDER — INSULIN ASPART 100 UNIT/ML IJ SOLN
0.0000 [IU] | Freq: Three times a day (TID) | INTRAMUSCULAR | Status: DC
  Administered 2023-11-03 (×2): 2 [IU] via SUBCUTANEOUS
  Administered 2023-11-04: 1 [IU] via SUBCUTANEOUS
  Administered 2023-11-04: 3 [IU] via SUBCUTANEOUS
  Administered 2023-11-04 – 2023-11-05 (×2): 2 [IU] via SUBCUTANEOUS
  Administered 2023-11-05: 3 [IU] via SUBCUTANEOUS
  Administered 2023-11-05 – 2023-11-06 (×2): 2 [IU] via SUBCUTANEOUS
  Administered 2023-11-06: 3 [IU] via SUBCUTANEOUS
  Administered 2023-11-06 – 2023-11-07 (×3): 2 [IU] via SUBCUTANEOUS
  Administered 2023-11-07: 5 [IU] via SUBCUTANEOUS
  Administered 2023-11-08 – 2023-11-09 (×4): 3 [IU] via SUBCUTANEOUS
  Administered 2023-11-09: 5 [IU] via SUBCUTANEOUS
  Filled 2023-11-03 (×6): qty 1

## 2023-11-03 MED ORDER — AMLODIPINE BESYLATE 5 MG PO TABS
10.0000 mg | ORAL_TABLET | Freq: Every day | ORAL | Status: DC
  Administered 2023-11-03 – 2023-11-09 (×7): 10 mg via ORAL
  Filled 2023-11-03 (×7): qty 2

## 2023-11-03 MED ORDER — LISINOPRIL 20 MG PO TABS
40.0000 mg | ORAL_TABLET | Freq: Every day | ORAL | Status: DC
  Administered 2023-11-03 – 2023-11-09 (×5): 40 mg via ORAL
  Filled 2023-11-03 (×6): qty 2

## 2023-11-03 MED ORDER — SERTRALINE HCL 25 MG PO TABS
25.0000 mg | ORAL_TABLET | Freq: Every day | ORAL | Status: DC
  Administered 2023-11-03 – 2023-11-04 (×2): 25 mg via ORAL
  Filled 2023-11-03 (×2): qty 1

## 2023-11-03 MED ORDER — EMPAGLIFLOZIN 25 MG PO TABS
25.0000 mg | ORAL_TABLET | Freq: Every day | ORAL | Status: DC
  Administered 2023-11-03 – 2023-11-09 (×7): 25 mg via ORAL
  Filled 2023-11-03 (×7): qty 1

## 2023-11-03 MED ORDER — INSULIN ASPART 100 UNIT/ML IJ SOLN
0.0000 [IU] | Freq: Every day | INTRAMUSCULAR | Status: DC
  Administered 2023-11-03 – 2023-11-04 (×2): 2 [IU] via SUBCUTANEOUS
  Administered 2023-11-05: 4 [IU] via SUBCUTANEOUS
  Administered 2023-11-08: 2 [IU] via SUBCUTANEOUS
  Filled 2023-11-03 (×5): qty 1
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
## 2023-11-03 NOTE — Plan of Care (Signed)
  Problem: Activity: Goal: Interest or engagement in leisure activities will improve Outcome: Progressing Goal: Imbalance in normal sleep/wake cycle will improve Outcome: Progressing   Problem: Education: Goal: Utilization of techniques to improve thought processes will improve Outcome: Progressing Goal: Knowledge of the prescribed therapeutic regimen will improve Outcome: Progressing
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
## 2023-11-03 NOTE — Inpatient Diabetes Management (Signed)
 Inpatient Diabetes Program Recommendations  AACE/ADA: New Consensus Statement on Inpatient Glycemic Control   Target Ranges:  Prepandial:   less than 140 mg/dL      Peak postprandial:   less than 180 mg/dL (1-2 hours)      Critically ill patients:  140 - 180 mg/dL    Latest Reference Range & Units 11/03/23 07:27  Glucose-Capillary 70 - 99 mg/dL 842 (H)   Review of Glycemic Control  Diabetes history: DM2 Outpatient Diabetes medications: Levemir  30 units daily if CBG >200 mg/dl, Jardiance  25 mg daily Current orders for Inpatient glycemic control: Jardiance  25 mg daily  Inpatient Diabetes Program Recommendations:    Insulin : Please consider ordering CBGs AC&HS, Novolog  0-9 units TID with meals, and Novolog  0-5 units at bedtime.  NOTE: Noted consult for inpatient diabetes team. Per chart, patient presented with suicidal thoughts and is currently in [facility] unit. CBG 157 mg/dl this morning. Per chart review, A1C 7.1% on 10/12/23.  Will recommend to order Novolog  correction scale. Will follow glucose trends and make further recommendations if needed.  Thanks, Earnie Gainer, RN, MSN, CDCES Diabetes Coordinator Inpatient Diabetes Program [phone] (Team Pager from 8am to 5pm)

## 2023-11-03 NOTE — BH IP Treatment Plan (Signed)
 Interdisciplinary Treatment and Diagnostic Plan Update  11/03/2023 Time of Session: 10:23 AM  AINARA Doyle MRN: [id number]  Principal Diagnosis: MDD (major depressive disorder), recurrent severe, without psychosis (HCC)  Secondary Diagnoses: Principal Problem:   MDD (major depressive disorder), recurrent severe, without psychosis (HCC)   Current Medications:  Current Facility-Administered Medications  Medication Dose Route Frequency Provider Last Rate Last Admin   acetaminophen  (TYLENOL ) tablet 650 mg  650 mg Oral Q6H PRN Jadapalle, Sree, MD       alum & mag hydroxide-simeth (MAALOX/MYLANTA) 200-200-20 MG/5ML suspension 30 mL  30 mL Oral Q4H PRN Jadapalle, Sree, MD       amLODipine  (NORVASC ) tablet 10 mg  10 mg Oral Daily Jadapalle, Sree, MD       atorvastatin  (LIPITOR) tablet 40 mg  40 mg Oral Daily Jadapalle, Sree, MD       empagliflozin  (JARDIANCE ) tablet 25 mg  25 mg Oral Daily Jadapalle, Sree, MD       NOREEN ON 11/04/2023] ferrous sulfate  tablet 325 mg  325 mg Oral Q breakfast Jadapalle, Sree, MD       hydrOXYzine  (ATARAX ) tablet 25 mg  25 mg Oral Q6H PRN Jadapalle, Sree, MD       insulin  aspart (novoLOG ) injection 0-5 Units  0-5 Units Subcutaneous QHS Jadapalle, Sree, MD       insulin  aspart (novoLOG ) injection 0-9 Units  0-9 Units Subcutaneous TID WC Jadapalle, Sree, MD       lisinopril  (ZESTRIL ) tablet 40 mg  40 mg Oral Daily Jadapalle, Sree, MD       magnesium  hydroxide (MILK OF MAGNESIA) suspension 30 mL  30 mL Oral Daily PRN Donnelly Mellow, MD       OLANZapine  (ZYPREXA ) injection 5 mg  5 mg Intramuscular TID PRN Jadapalle, Sree, MD       OLANZapine  zydis (ZYPREXA ) disintegrating tablet 5 mg  5 mg Oral TID PRN Jadapalle, Sree, MD       pantoprazole  (PROTONIX ) EC tablet 40 mg  40 mg Oral Daily Jadapalle, Sree, MD       traZODone  (DESYREL ) tablet 50 mg  50 mg Oral QHS PRN Jadapalle, Sree, MD   50 mg at 11/02/23 2307   PTA Medications: Medications Prior to Admission   Medication Sig Dispense Refill Last Dose/Taking   acetaminophen  (TYLENOL ) 650 MG CR tablet SMARTSIG:1-2 Tablet(s) By Mouth Every 8 Hours PRN (Patient not taking: Reported on 11/02/2023)      amLODipine  (NORVASC ) 10 MG tablet Take 1 tablet (10 mg total) by mouth daily. 30 tablet 2    atorvastatin  (LIPITOR) 40 MG tablet Take 1 tablet (40 mg total) by mouth daily. 90 tablet 1    BD PEN NEEDLE NANO 2ND GEN 32G X 4 MM MISC 3 (three) times daily. :3 Times Daily      blood glucose meter kit and supplies KIT Dispense based on patient and insurance preference. Use up to four times daily as directed. 1 each 0    blood glucose meter kit and supplies Dispense based on patient and insurance preference. Use up to four times daily as directed. (FOR ICD-10 E10.9, E11.9). 1 each 0    cetirizine  (ZYRTEC  ALLERGY) 10 MG tablet Take 1 tablet (10 mg total) by mouth daily. 20 tablet 0    ferrous sulfate  325 (65 FE) MG tablet Take 1 tablet (325 mg total) by mouth daily with breakfast. 30 tablet 1    FLUoxetine (PROZAC) 40 MG capsule Take 40 mg by mouth daily. (Patient  not taking: Reported on 11/02/2023)      hydrOXYzine  (ATARAX ) 25 MG tablet Take 25 mg by mouth daily. (Patient not taking: Reported on 11/02/2023)      insulin  detemir (LEVEMIR ) 100 UNIT/ML injection Inject 0.3 mLs (30 Units total) into the skin daily. Each day that the morning blood sugar is greater than 200, add one unit to the evening insulin  detemir  dose 10 mL 2    JARDIANCE  25 MG TABS tablet Take 25 mg by mouth daily.      lisinopril  (ZESTRIL ) 40 MG tablet Take 1 tablet (40 mg total) by mouth daily. 30 tablet 2    omeprazole (PRILOSEC) 20 MG capsule Take 20 mg by mouth 2 (two) times daily before a meal.  (Patient not taking: Reported on 11/02/2023)      venlafaxine XR (EFFEXOR-XR) 150 MG 24 hr capsule Take 150 mg by mouth daily.      VICTOZA 18 MG/3ML SOPN Inject into the skin. (Patient not taking: Reported on 11/02/2023)       Patient Stressors:     Patient Strengths:    Treatment Modalities: Medication Management, Group therapy, Case management,  1 to 1 session with clinician, Psychoeducation, Recreational therapy.   Physician Treatment Plan for Primary Diagnosis: MDD (major depressive disorder), recurrent severe, without psychosis (HCC) Long Term Goal(s):     Short Term Goals:    Medication Management: Evaluate patient's response, side effects, and tolerance of medication regimen.  Therapeutic Interventions: 1 to 1 sessions, Unit Group sessions and Medication administration.  Evaluation of Outcomes: Not Progressing  Physician Treatment Plan for Secondary Diagnosis: Principal Problem:   MDD (major depressive disorder), recurrent severe, without psychosis (HCC)  Long Term Goal(s):     Short Term Goals:       Medication Management: Evaluate patient's response, side effects, and tolerance of medication regimen.  Therapeutic Interventions: 1 to 1 sessions, Unit Group sessions and Medication administration.  Evaluation of Outcomes: Not Progressing   RN Treatment Plan for Primary Diagnosis: MDD (major depressive disorder), recurrent severe, without psychosis (HCC) Long Term Goal(s): Knowledge of disease and therapeutic regimen to maintain health will improve  Short Term Goals: Ability to remain free from injury will improve, Ability to verbalize frustration and anger appropriately will improve, Ability to demonstrate self-control, Ability to participate in decision making will improve, Ability to verbalize feelings will improve, Ability to disclose and discuss suicidal ideas, Ability to identify and develop effective coping behaviors will improve, and Compliance with prescribed medications will improve  Medication Management: RN will administer medications as ordered by provider, will assess and evaluate patient's response and provide education to patient for prescribed medication. RN will report any adverse and/or side  effects to prescribing provider.  Therapeutic Interventions: 1 on 1 counseling sessions, Psychoeducation, Medication administration, Evaluate responses to treatment, Monitor vital signs and CBGs as ordered, Perform/monitor CIWA, COWS, AIMS and Fall Risk screenings as ordered, Perform wound care treatments as ordered.  Evaluation of Outcomes: Not Progressing   LCSW Treatment Plan for Primary Diagnosis: MDD (major depressive disorder), recurrent severe, without psychosis (HCC) Long Term Goal(s): Safe transition to appropriate next level of care at discharge, Engage patient in therapeutic group addressing interpersonal concerns.  Short Term Goals: Engage patient in aftercare planning with referrals and resources, Increase social support, Increase ability to appropriately verbalize feelings, Increase emotional regulation, Facilitate acceptance of mental health diagnosis and concerns, Facilitate patient progression through stages of change regarding substance use diagnoses and concerns, Identify triggers associated with  mental health/substance abuse issues, and Increase skills for wellness and recovery  Therapeutic Interventions: Assess for all discharge needs, 1 to 1 time with Social worker, Explore available resources and support systems, Assess for adequacy in community support network, Educate family and significant other(s) on suicide prevention, Complete Psychosocial Assessment, Interpersonal group therapy.  Evaluation of Outcomes: Not Progressing   Progress in Treatment: Attending groups: Yes. and No. Participating in groups: Yes. and No. Taking medication as prescribed: Yes. Toleration medication: Yes. Family/Significant other contact made: No, will contact:  CSW will contact if given permission  Patient understands diagnosis: Yes. Discussing patient identified problems/goals with staff: Yes. Medical problems stabilized or resolved: Yes. Denies suicidal/homicidal ideation:  Yes. Issues/concerns per patient self-inventory: No. Other: None   New problem(s) identified: No, Describe:  None identified   New Short Term/Long Term Goal(s): elimination of symptoms of psychosis, medication management for mood stabilization; elimination of SI thoughts; development of comprehensive mental wellness plan.   Patient Goals:  My goal is to find me somewhere to stay  Discharge Plan or Barriers: CSW will assist with appropriate discharge planning   Reason for Continuation of Hospitalization: Depression Medication stabilization  Estimated Length of Stay: 1 to 7 days   Last 3 [place] Suicide Severity Risk Score: Flowsheet Row Admission (Current) from 11/02/2023 in [facility] ED from 11[facility]01/2023 in [facility] ED from 03/17/20[facility]  C-SSRS RISK CATEGORY Low Risk High Risk No Risk    Last PHQ 2/9 Scores:     No data to display          Scribe for Treatment Team: Lum [name], [place] 11/03/2023 11:35 AM

## 2023-11-03 NOTE — Progress Notes (Signed)
   11/03/23 1200  Psych Admission Type (Psych Patients Only)  Admission Status Voluntary  Psychosocial Assessment  Patient Complaints Anxiety;Helplessness;Depression;Worrying  Eye Contact Brief  Facial Expression Animated  Affect Anxious;Depressed  Speech Logical/coherent  Interaction Assertive  Motor Activity Slow  Appearance/Hygiene In scrubs  Behavior Characteristics Cooperative  Mood Depressed;Anxious;Pleasant  Thought Process  Coherency WDL  Content WDL  Delusions None reported or observed  Perception WDL  Hallucination None reported or observed  Judgment Impaired  Confusion WDL  Danger to Self  Current suicidal ideation? Denies  Danger to Others  Danger to Others None reported or observed

## 2023-11-03 NOTE — Group Note (Signed)
 Date:  11/03/2023 Time:  4:16 PM  Group Topic/Focus:  Self Esteem Action Plan:   The focus of this group is to help patients create a plan to continue to build self-esteem after discharge.    Participation Level:  Active  Participation Quality:  Appropriate  Affect:  Appropriate  Cognitive:  Appropriate  Insight: Appropriate  Engagement in Group:  Engaged  Modes of Intervention:  Activity   Arland Nutting 11/03/2023, 4:16 PM

## 2023-11-03 NOTE — [facility] Suicide Risk Assessment (Signed)
 [facility] INPATIENT:  Family/Significant Other Suicide Prevention Education  Suicide Prevention Education:  Patient Refusal for Family/Significant Other Suicide Prevention Education: The patient Kelly Doyle has refused to provide written consent for family/significant other to be provided Family/Significant Other Suicide Prevention Education during admission and/or prior to discharge.  Physician notified.  Kelly Doyle 11/03/2023, 2:10 PM

## 2023-11-03 NOTE — Group Note (Signed)
 Physical/Occupational Therapy Group Note  Group Topic: Yoga  Group Date: 11/03/2023 Start Time: 1308 End Time: 1338 Facilitators: Briseida Gittings, Alm Hamilton, PT   Group Description: Group participated with series of yoga poses, designed to emphasize functional sitting balance, core stability, generalized flexibility and overall posture.  Incorporated deep breathing techniques with poses, working to promote relaxation, mindfulness and focus with targeted activities.   Discussed benefits of yoga in improving mood and self-esteem, reducing stress and anxiety, and promoting functional strength and balance for each participant.  Discussed ways to integrate into each participant's daily routine.  Provided handout with written and pictorial descriptions of included yoga movements to be utilized as appropriate outside of group time.  Therapeutic Goal(s):  Demonstrate safe ability to participate with yoga poses during group activity. Identify one benefit of participation with yoga poses as part of each participant's exercise/movement routine. Identify 1-2 individual poses that participant feels most beneficial to his/her needs and that he/she can easily replicate outside of group.  Individual Participation: Pt actively participated with the discussion and physical activity components of the session although verbal communication was somewhat limited.    Participation Level: Moderate   Participation Quality: Moderate Cues   Behavior: Alert and Appropriate   Speech/Thought Process: Focused and Organized   Affect/Mood: Flat   Insight: Fair   Judgement: Fair   Modes of Intervention: Activity, Discussion, and Education  Patient Response to Interventions:  Attentive, Engaged, and Receptive   Plan: Continue to engage patient in PT/OT groups 1 - 2x/week.  CHARM Hamilton Bertin PT, DPT 11/03/23, 4:56 PM

## 2023-11-03 NOTE — Progress Notes (Signed)
 Admission Note:  52 yr AA female who presents voluntary from ED and is in no acute distress for the treatment of Depression. Pt appears flat and sad but, calm and cooperative with admission process. Pt presents denied SI, plan or intent and contracts for safety upon admission. Pt denies AVH, reports not having anywhere to go is her reason for been admitted. Pt stated she have been living with various relatives since and does not have her own housing. Pt also reports a hx of Diabetes, HTN and arthritis. Pt skin was assessed and found to old scars to her abdomen, right arm, and back however, shin is intact. PT searched and no contraband found, POC and unit policies explained and understanding verbalized. Consents obtained. Food and fluids offered, and pt declined. Pt had no additional questions or concerns, she was placed in room L-31 and is on 15 min rounds for safety and support.

## 2023-11-03 NOTE — Group Note (Signed)
 Date:  11/03/2023 Time:  10:50 PM  Group Topic/Focus:  Wrap-Up Group:   The focus of this group is to help patients review their daily goal of treatment and discuss progress on daily workbooks.    Participation Level:  Active  Participation Quality:  Appropriate  Affect:  Appropriate  Cognitive:  Appropriate  Insight: Appropriate  Engagement in Group:  Engaged  Modes of Intervention:  Discussion  Additional Comments:    Kelly Doyle [name] 11/03/2023, 10:50 PM

## 2023-11-03 NOTE — Group Note (Signed)
 Date:  11/03/2023 Time:  11:20 AM  Group Topic/Focus:  Goals/Karaoke Group:   The focus of this group is to help patients establish daily goals to achieve during treatment and discuss how the patient can incorporate goal setting into their daily lives to aide in recovery. Also singing along to their favorite tunes and interacting with one another.     Participation Level:  Active  Participation Quality:  Appropriate  Affect:  Appropriate  Cognitive:  Appropriate  Insight: Appropriate  Engagement in Group:  Engaged  Modes of Intervention:  Activity  Additional Comments:    [name] 11/03/2023, 11:20 AM

## 2023-11-03 NOTE — Plan of Care (Signed)
  Problem: Education: Goal: Utilization of techniques to improve thought processes will improve Outcome: Not Met (add Reason) Goal: Knowledge of the prescribed therapeutic regimen will improve Outcome: Not Met (add Reason)   Problem: Activity: Goal: Interest or engagement in leisure activities will improve Outcome: Not Met (add Reason) Goal: Imbalance in normal sleep/wake cycle will improve Outcome: Not Met (add Reason)   Problem: Coping: Goal: Coping ability will improve Outcome: Not Met (add Reason) Goal: Will verbalize feelings Outcome: Not Met (add Reason)

## 2023-11-04 DIAGNOSIS — F332 Major depressive disorder, recurrent severe without psychotic features: Secondary | ICD-10-CM | POA: Diagnosis not present

## 2023-11-04 LAB — HEMOGLOBIN A1C
Hgb A1c MFr Bld: 6.6 % — ABNORMAL HIGH (ref 4.8–5.6)
Mean Plasma Glucose: 143 mg/dL

## 2023-11-04 LAB — GLUCOSE, CAPILLARY
Glucose-Capillary: 170 mg/dL — ABNORMAL HIGH (ref 70–99)
Glucose-Capillary: 137 mg/dL — ABNORMAL HIGH (ref 70–99)
Glucose-Capillary: 201 mg/dL — ABNORMAL HIGH (ref 70–99)

## 2023-11-04 MED ORDER — SERTRALINE HCL 50 MG PO TABS
50.0000 mg | ORAL_TABLET | Freq: Every day | ORAL | Status: DC
  Administered 2023-11-05 – 2023-11-09 (×5): 50 mg via ORAL
  Filled 2023-11-04 (×5): qty 1

## 2023-11-04 NOTE — [facility] Counselor (Signed)
 CSW provided information for boarding houses in the area per pt's request.   Lum Croft, MSW, [facility] 11/04/2023 10:53 AM

## 2023-11-04 NOTE — Plan of Care (Signed)
 Patient alert and oriented x 4. Denies SI, HI, AVH and pain. Scheduled medications administered per MAR. Pt expressed difficulty falling asleep the previous night. PRN sleep aid given. Support and encouragement provided.  Routine safety checks conducted every 15 minutes.  Patient informed to notify staff with problems or concerns. No adverse drug reactions noted. Patient verbally contracts for safety at this time. Patient interacts well with others on the unit.  Patient remains safe at this time.  Problem: Education: Goal: Utilization of techniques to improve thought processes will improve Outcome: Progressing Goal: Knowledge of the prescribed therapeutic regimen will improve Outcome: Progressing   Problem: Coping: Goal: Coping ability will improve Outcome: Progressing Goal: Will verbalize feelings Outcome: Progressing   Problem: Health Behavior/Discharge Planning: Goal: Ability to make decisions will improve Outcome: Progressing Goal: Compliance with therapeutic regimen will improve Outcome: Progressing   Problem: Safety: Goal: Ability to disclose and discuss suicidal ideas will improve Outcome: Progressing Goal: Ability to identify and utilize support systems that promote safety will improve Outcome: Progressing

## 2023-11-04 NOTE — Progress Notes (Signed)
 [facility] MD Progress Note  11/04/2023 3:42 PM Kelly Doyle  MRN:  [id number]   Kelly Doyle is a 68 y/o F who is A&O x3 with PMH significant for MDD, DM2 requiring insulin , HTN, and CKD (stage 3). She initially presented to the [facility] ED on on 11/01/23 for suicidal ideation and threatening bodily harm to herself with a knife. Her daughter stopped her and brought the pt to the ED for evaluation Patient is admitted to [facility] psych unit with Q15 min safety monitoring. Multidisciplinary team approach is offered. Medication management; group/milieu therapy is offered.   Subjective:  Chart reviewed, case discussed in multidisciplinary meeting, patient seen during rounds.  Patient is noted to be resting in bed.  She reports not feeling well but denies having any physical symptoms.  She reports that she wants to leave the hospital but then unclear where she will go as she remembers being homeless.  She reports calling the boardinghouse and since her check is not coming in 2 weeks she is really not able to finalize any of the boarding houses.  She minimizes her symptoms but is noted to be displaying hopelessness.  She denies SI/HI/plan and denies hallucinations.  She has been isolating herself in her room.  Provider encouraged her to possibly attend groups.   Sleep: Fair  Appetite:  Fair  Past Psychiatric History: see h&P Family History:  Family History  Problem Relation Age of Onset   Alzheimer's disease Mother    Hypertension Mother    Diabetes Sister    Gout Brother    Social History:  Social History   Substance and Sexual Activity  Alcohol Use No     Social History   Substance and Sexual Activity  Drug Use No    Social History   Socioeconomic History   Marital status: Single    Spouse name: Not on file   Number of children: Not on file   Years of education: Not on file   Highest education level: Not on file  Occupational History   Not on file  Tobacco Use   Smoking status: Never    Smokeless tobacco: Never  Vaping Use   Vaping status: Never Used  Substance and Sexual Activity   Alcohol use: No   Drug use: No   Sexual activity: Yes    Birth control/protection: None  Other Topics Concern   Not on file  Social History Narrative   Not on file   Social Drivers of Health   Financial Resource Strain: Not on file  Food Insecurity: No Food Insecurity (11/02/2023)   Hunger Vital Sign    Worried About Running Out of Food in the Last Year: Never true    Ran Out of Food in the Last Year: Never true  Transportation Needs: No Transportation Needs (11/02/2023)   PRAPARE - Administrator, Civil Service (Medical): No    Lack of Transportation (Non-Medical): No  Physical Activity: Not on file  Stress: Not on file  Social Connections: Moderately Isolated (11/02/2023)   Social Connection and Isolation Panel    Frequency of Communication with Friends and Family: Three times a week    Frequency of Social Gatherings with Friends and Family: More than three times a week    Attends Religious Services: 1 to 4 times per year    Active Member of [employer] or Organizations: No    Attends Banker Meetings: Never    Marital Status: Divorced   Past Medical History:  Past Medical History:  Diagnosis Date   Anxiety    Arthritis    Diabetes mellitus without complication (HCC)    GERD (gastroesophageal reflux disease)    Hypertension     Past Surgical History:  Procedure Laterality Date   COLONOSCOPY WITH PROPOFOL  N/A 06/23/2017   Procedure: COLONOSCOPY WITH PROPOFOL ;  Surgeon: Toledo, Ladell POUR, MD;  Location: [facility] END[facility];  Service: Gastroenterology;  Laterality: N/A;   DILATION AND CURETTAGE OF UTERUS  04/20/1973   NO PAST SURGERIES     TUBAL LIGATION      Current Medications: Current Facility-Administered Medications  Medication Dose Route Frequency Provider Last Rate Last Admin   acetaminophen  (TYLENOL ) tablet 650 mg  650 mg Oral Q6H PRN Reverie Vaquera,  Olivya Sobol, MD       alum & mag hydroxide-simeth (MAALOX/MYLANTA) 200-200-20 MG/5ML suspension 30 mL  30 mL Oral Q4H PRN Millicent Blazejewski, MD       amLODipine  (NORVASC ) tablet 10 mg  10 mg Oral Daily Revonda Menter, MD   10 mg at 11/04/23 0840   atorvastatin  (LIPITOR) tablet 40 mg  40 mg Oral Daily Alnita Aybar, MD   40 mg at 11/04/23 0840   empagliflozin  (JARDIANCE ) tablet 25 mg  25 mg Oral Daily Alec Jaros, MD   25 mg at 11/04/23 0840   ferrous sulfate  tablet 325 mg  325 mg Oral Q breakfast Donnelly Mellow, MD   325 mg at 11/04/23 0840   hydrOXYzine  (ATARAX ) tablet 25 mg  25 mg Oral Q6H PRN Dyann Goodspeed, MD       insulin  aspart (novoLOG ) injection 0-5 Units  0-5 Units Subcutaneous QHS Bralin Garry, MD   2 Units at 11/03/23 2100   insulin  aspart (novoLOG ) injection 0-9 Units  0-9 Units Subcutaneous TID WC Wenceslao Loper, MD   1 Units at 11/04/23 1204   lisinopril  (ZESTRIL ) tablet 40 mg  40 mg Oral Daily Orrie Schubert, MD   40 mg at 11/04/23 0840   magnesium  hydroxide (MILK OF MAGNESIA) suspension 30 mL  30 mL Oral Daily PRN Donnelly Mellow, MD       OLANZapine  (ZYPREXA ) injection 5 mg  5 mg Intramuscular TID PRN Ruthel Martine, MD       OLANZapine  zydis (ZYPREXA ) disintegrating tablet 5 mg  5 mg Oral TID PRN Gautam Langhorst, MD       pantoprazole  (PROTONIX ) EC tablet 40 mg  40 mg Oral Daily Babbie Dondlinger, MD   40 mg at 11/04/23 0840   sertraline  (ZOLOFT ) tablet 25 mg  25 mg Oral Daily Landree Fernholz, MD   25 mg at 11/04/23 0840   traZODone  (DESYREL ) tablet 50 mg  50 mg Oral QHS PRN Randol Zumstein, MD   50 mg at 11/03/23 2107    Lab Results:  Results for orders placed or performed during the hospital encounter of 11/02/23 (from the past 48 hours)  Glucose, capillary     Status: Abnormal   Collection Time: 11/03/23  7:27 AM  Result Value Ref Range   Glucose-Capillary 157 (H) 70 - 99 mg/dL    Comment: Glucose reference range applies only to samples taken after fasting for at  least 8 hours.  Glucose, capillary     Status: Abnormal   Collection Time: 11/03/23 11:29 AM  Result Value Ref Range   Glucose-Capillary 200 (H) 70 - 99 mg/dL    Comment: Glucose reference range applies only to samples taken after fasting for at least 8 hours.  Glucose, capillary     Status:  Abnormal   Collection Time: 11/03/23  4:33 PM  Result Value Ref Range   Glucose-Capillary 195 (H) 70 - 99 mg/dL    Comment: Glucose reference range applies only to samples taken after fasting for at least 8 hours.  Glucose, capillary     Status: Abnormal   Collection Time: 11/03/23  8:00 PM  Result Value Ref Range   Glucose-Capillary 227 (H) 70 - 99 mg/dL    Comment: Glucose reference range applies only to samples taken after fasting for at least 8 hours.  Glucose, capillary     Status: Abnormal   Collection Time: 11/04/23  7:46 AM  Result Value Ref Range   Glucose-Capillary 170 (H) 70 - 99 mg/dL    Comment: Glucose reference range applies only to samples taken after fasting for at least 8 hours.  Glucose, capillary     Status: Abnormal   Collection Time: 11/04/23 11:35 AM  Result Value Ref Range   Glucose-Capillary 137 (H) 70 - 99 mg/dL    Comment: Glucose reference range applies only to samples taken after fasting for at least 8 hours.    Blood Alcohol level:  Lab Results  Component Value Date   [facility] <15 11/01/2023   ETH <5 10/02/2014    Metabolic Disorder Labs: Lab Results  Component Value Date   HGBA1C 6.6 (H) 11/01/2023   MPG 143 11/01/2023   MPG 194.38 04/28/2021   No results found for: PROLACTIN Lab Results  Component Value Date   CHOL 143 04/28/2021   TRIG 71 04/28/2021   HDL 62 04/28/2021   CHOLHDL 2.3 04/28/2021   VLDL 14 04/28/2021   LDLCALC 67 04/28/2021   LDLCALC 73 07/09/2016    Physical Findings: AIMS:  , ,  ,  ,    CIWA:    COWS:      Psychiatric Specialty Exam:  Presentation  General Appearance:  Appropriate for Environment; Casual  Eye  Contact: Fair  Speech: Clear and Coherent  Speech Volume: Normal    Mood and Affect  Mood: Depressed; Dysphoric  Affect: Depressed; Flat; Tearful   Thought Process  Thought Processes: Coherent  Descriptions of Associations:Intact  Orientation:Full (Time, Place and Person)  Thought Content:Logical  Hallucinations:Hallucinations: None  Ideas of Reference:None  Suicidal Thoughts: Denies Homicidal Thoughts:Homicidal Thoughts: No   Sensorium  Memory: Immediate Fair; Recent Fair; Remote Fair  Judgment: Impaired  Insight: Shallow   Executive Functions  Concentration: Fair  Attention Span: Fair  Recall: [employer] of Knowledge: Fair  Language: Fair   Psychomotor Activity  Psychomotor Activity: Psychomotor Activity: Normal  Musculoskeletal: Strength & Muscle Tone: within normal limits Gait & Station: normal Assets  Assets: Manufacturing systems engineer; Financial Resources/Insurance; Resilience    Physical Exam: Physical Exam Vitals and nursing note reviewed.    Review of Systems  Constitutional: Negative.   Eyes: Negative.    Blood pressure 125/84, pulse 90, temperature (!) 97.3 F (36.3 C), resp. rate 18, height 5' 2 (1.575 m), weight 68 kg, SpO2 98%. Body mass index is 27.42 kg/m.  Diagnosis: Principal Problem:   MDD (major depressive disorder), recurrent severe, without psychosis (HCC)   reatment Plan Summary:   Safety and Monitoring:             -- Voluntary admission to inpatient psychiatric unit for safety, stabilization and treatment             -- Daily contact with patient to assess and evaluate symptoms and progress in treatment             --  Patient's case to be discussed in multi-disciplinary team meeting             -- Observation Level: q15 minute checks             -- Vital signs:  q12 hours             -- Precautions: suicide, elopement, and assault   2. Psychiatric Diagnoses and Treatment:                 zoloft  increased to 50 mg daily   -- The risks/benefits/side-effects/alternatives to this medication were discussed in detail with the patient and time was given for questions. The patient consents to medication trial.                -- Metabolic profile and EKG monitoring obtained while on an atypical antipsychotic (BMI: Lipid Panel: HbgA1c: QTc:)              -- Encouraged patient to participate in unit milieu and in scheduled group therapies                             4. Discharge Planning:   -- Social work and case management to assist with discharge planning and identification of hospital follow-up needs prior to discharge  -- Estimated LOS: 3-4 days  Kaislee Chao, MD 11/04/2023, 3:42 PM

## 2023-11-04 NOTE — Group Note (Signed)
 Date:  11/04/2023 Time:  11:03 AM  Group Topic/Focus:  Healthy Communication:   The focus of this group is to discuss communication, barriers to communication, as well as healthy ways to communicate with others.    Participation Level:  Did Not Attend   Kelly Doyle 11/04/2023, 11:03 AM

## 2023-11-04 NOTE — Progress Notes (Signed)
   11/03/23 1935  Psych Admission Type (Psych Patients Only)  Admission Status Voluntary  Psychosocial Assessment  Patient Complaints Other (Comment) (Something for sleep tonight please.)  Eye Contact Brief  Facial Expression Animated  Affect Depressed  Speech Incoherent  Interaction Assertive  Motor Activity Slow  Appearance/Hygiene In scrubs  Behavior Characteristics Cooperative  Mood Depressed;Pleasant  Aggressive Behavior  Effect No apparent injury  Thought Process  Coherency WDL  Content WDL  Delusions None reported or observed  Perception WDL  Hallucination None reported or observed  Judgment Impaired  Confusion WDL  Danger to Self  Current suicidal ideation? Denies  Danger to Others  Danger to Others None reported or observed

## 2023-11-04 NOTE — Group Note (Signed)
 Date:  11/04/2023 Time:  4:12 PM  Group Topic/Focus:  Building Self Esteem:   The Focus of this group is helping patients become aware of the effects of self-esteem on their lives, the things they and others do that enhance or undermine their self-esteem, seeing the relationship between their level of self-esteem and the choices they make and learning ways to enhance self-esteem.    Participation Level:  Active  Participation Quality:  Appropriate  Affect:  Appropriate  Cognitive:  Appropriate  Insight: Appropriate  Engagement in Group:  Engaged  Modes of Intervention:  Role-play  A Arland Nutting 11/04/2023, 4:12 PM

## 2023-11-04 NOTE — Progress Notes (Signed)
 Glucometer did not upload properly. The dinner blood glucose was 210 mg/dl.

## 2023-11-04 NOTE — Progress Notes (Signed)
   11/04/23 1000  Psych Admission Type (Psych Patients Only)  Admission Status Voluntary  Psychosocial Assessment  Patient Complaints Depression  Eye Contact Brief  Facial Expression Animated  Affect Depressed  Speech Logical/coherent  Interaction Assertive  Motor Activity Slow  Appearance/Hygiene In scrubs  Behavior Characteristics Cooperative  Thought Process  Coherency WDL  Content WDL  Delusions None reported or observed  Perception WDL  Hallucination None reported or observed  Judgment Impaired  Confusion WDL  Danger to Self  Current suicidal ideation? Denies  Danger to Others  Danger to Others None reported or observed

## 2023-11-04 NOTE — Plan of Care (Signed)
   Problem: Education: Goal: Utilization of techniques to improve thought processes will improve Outcome: Progressing Goal: Knowledge of the prescribed therapeutic regimen will improve Outcome: Progressing

## 2023-11-04 NOTE — Group Note (Signed)
 Recreation Therapy Group Note   Group Topic:Healthy Support Systems  Group Date: 11/04/2023 Start Time: 1400 End Time: 1450 Facilitators: Celestia Jeoffrey BRAVO, LRT, CTRS Location: Dayroom  Group Description: Straw Bridge. In groups or individually, patients were given 10 plastic drinking straws and an equal length of masking tape. Using the materials provided, patients were instructed to build a free-standing bridge-like structure to suspend an everyday item (ex: deck of cards) off the floor or table surface. All materials were required to be used in Secondary school teacher. LRT facilitated post-activity discussion reviewing the importance of having strong and healthy support systems in our lives. LRT discussed how the people in our lives serve as the tape and the deck of cards we placed on top of our straw structure are the stressors we face in daily life. LRT and pts discussed what happens in our life when things get too heavy for us , and we don't have strong supports outside of the hospital. Pt shared 2 of their healthy supports in their life aloud in the group.   Goal Area(s) Addressed:  Patient will identify 2 healthy supports in their life. Patient will identify skills to successfully complete activity. Patient will identify correlation of this activity to life post-discharge.  Patient will build on frustration tolerance skills. Patient will increase team building and communication skills.    Affect/Mood: N/A   Participation Level: Did not attend    Clinical Observations/Individualized Feedback: Patient did not attend group.   Plan: Continue to engage patient in RT group sessions 2-3x/week.   Jeoffrey BRAVO Celestia, LRT, CTRS 11/04/2023 4:44 PM

## 2023-11-04 NOTE — Group Note (Signed)
 LCSW Group Therapy Note  Group Date: 11/04/2023 Start Time: 1315 End Time: 1345   Type of Therapy and Topic:  Group Therapy - Healthy vs Unhealthy Coping Skills  Participation Level:  Did Not Attend   Description of Group The focus of this group was to determine what unhealthy coping techniques typically are used by group members and what healthy coping techniques would be helpful in coping with various problems. Patients were guided in becoming aware of the differences between healthy and unhealthy coping techniques. Patients were asked to identify 2-3 healthy coping skills they would like to learn to use more effectively.  Therapeutic Goals Patients learned that coping is what human beings do all day long to deal with various situations in their lives Patients defined and discussed healthy vs unhealthy coping techniques Patients identified their preferred coping techniques and identified whether these were healthy or unhealthy Patients determined 2-3 healthy coping skills they would like to become more familiar with and use more often. Patients provided support and ideas to each other   Summary of Patient Progress: X   Therapeutic Modalities Cognitive Behavioral Therapy Motivational Interviewing  Kelly Doyle, LCSWA 11/04/2023  1:51 PM

## 2023-11-05 DIAGNOSIS — F332 Major depressive disorder, recurrent severe without psychotic features: Secondary | ICD-10-CM | POA: Diagnosis not present

## 2023-11-05 LAB — GLUCOSE, CAPILLARY
Glucose-Capillary: 210 mg/dL — ABNORMAL HIGH (ref 70–99)
Glucose-Capillary: 189 mg/dL — ABNORMAL HIGH (ref 70–99)
Glucose-Capillary: 237 mg/dL — ABNORMAL HIGH (ref 70–99)
Glucose-Capillary: 155 mg/dL — ABNORMAL HIGH (ref 70–99)
Glucose-Capillary: 330 mg/dL — ABNORMAL HIGH (ref 70–99)

## 2023-11-05 NOTE — Plan of Care (Signed)
  Problem: Education: Goal: Utilization of techniques to improve thought processes will improve Outcome: Progressing Goal: Knowledge of the prescribed therapeutic regimen will improve Outcome: Progressing   Problem: Activity: Goal: Interest or engagement in leisure activities will improve Outcome: Progressing Goal: Imbalance in normal sleep/wake cycle will improve Outcome: Progressing   Problem: Coping: Goal: Coping ability will improve Outcome: Progressing Goal: Will verbalize feelings Outcome: Progressing   Problem: Health Behavior/Discharge Planning: Goal: Ability to make decisions will improve Outcome: Progressing Goal: Compliance with therapeutic regimen will improve Outcome: Progressing   Problem: Safety: Goal: Ability to disclose and discuss suicidal ideas will improve Outcome: Progressing Goal: Ability to identify and utilize support systems that promote safety will improve Outcome: Progressing

## 2023-11-05 NOTE — Progress Notes (Signed)
 Calm and cooperative. Flat affect. Endorses anxiety and depression 8/10. Particle participation in group. PRNs given po for anxiety and insomnia. Tol well. No behavior issues noted. Denies SI/HI/AVH. No c/o pain/discomfort noted.     11/04/23 2100  Psych Admission Type (Psych Patients Only)  Admission Status Voluntary  Psychosocial Assessment  Patient Complaints Anxiety  Eye Contact Brief  Facial Expression Animated  Affect Anxious  Speech Logical/coherent  Interaction Assertive  Motor Activity Slow  Appearance/Hygiene In scrubs  Behavior Characteristics Cooperative  Mood Pleasant  Thought Process  Coherency WDL  Content WDL  Delusions None reported or observed  Perception WDL  Hallucination None reported or observed  Judgment Impaired  Confusion WDL  Danger to Self  Current suicidal ideation? Denies

## 2023-11-05 NOTE — Group Note (Signed)
 Physical/Occupational Therapy Group Note  Group Topic: UE Therex   Group Date: 11/05/2023 Start Time: 1300 End Time: 1340 Facilitators: Clive Warren CROME, OT     Group Description: Group instructed in series of upper extremities exercises, aimed to promote strength, flexibility, range of motion and functional endurance.  Patients provided cuing for proper mechanics and proper pace of exercise; exercises adjusted as necessary for individualized patient needs.  Patient also engaged in cognitive components throughout session, working to integrate attention to task, command following, turn-taking and appropriate social interaction throughout session.  Allowed to ask questions as appropriate, and encouraged to identify specific exercises that they could complete independently outside of group sessions.   Therapeutic Goal(s): Demonstrate appropriate performance of upper extremity exercises to promote strength, flexibility, range of motion and functional endurance Identify 2-3 specific upper extremity exercises to complete as home exercise program outside of group session   Individual Participation: Pt engaged throughout. She required moderate multimodal cues during session to improve technique for many of the exercises. Engaged with and without prompting. Demonstrated some difficulty with sequencing but receptive to instruction and feedback.   Participation Level: Active and Engaged   Participation Quality: Moderate Cues   Behavior: Alert and Appropriate   Speech/Thought Process: Coherent and Organized   Affect/Mood: Appropriate   Insight: Poor   Judgement: Poor   Modes of Intervention: Activity, Clarification, Discussion, Education, Exploration, Problem-solving, Rapport Building, Socialization, and Support  Patient Response to Interventions:  Attentive, Engaged, and Interested    Plan: Continue to engage patient in PT/OT groups 1 - 2x/week.  Arpan Eskelson R., MPH, MS, OTR/L ascom  [phone] 11/05/23, 2:06 PM
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
## 2023-11-05 NOTE — Progress Notes (Signed)
   11/05/23 2100  Psych Admission Type (Psych Patients Only)  Admission Status Voluntary  Psychosocial Assessment  Patient Complaints Anxiety  Eye Contact Brief  Facial Expression Animated  Affect Appropriate to circumstance  Speech Logical/coherent  Interaction Assertive  Motor Activity Slow  Appearance/Hygiene In scrubs  Behavior Characteristics Cooperative  Mood Pleasant  Thought Process  Coherency WDL  Content WDL  Delusions None reported or observed  Perception WDL  Hallucination None reported or observed  Judgment Impaired  Confusion None  Danger to Self  Current suicidal ideation? Denies  Danger to Others  Danger to Others None reported or observed

## 2023-11-05 NOTE — Plan of Care (Signed)
 Problem: Education: Goal: Utilization of techniques to improve thought processes will improve Outcome: Progressing Goal: Knowledge of the prescribed therapeutic regimen will improve Outcome: Progressing   Problem: Activity: Goal: Interest or engagement in leisure activities will improve Outcome: Progressing Goal: Imbalance in normal sleep/wake cycle will improve Outcome: Progressing   Problem: Coping: Goal: Coping ability will improve Outcome: Progressing Goal: Will verbalize feelings Outcome: Progressing   Problem: Health Behavior/Discharge Planning: Goal: Ability to make decisions will improve Outcome: Progressing Goal: Compliance with therapeutic regimen will improve Outcome: Progressing   Problem: Role Relationship: Goal: Will demonstrate positive changes in social behaviors and relationships Outcome: Progressing   Problem: Safety: Goal: Ability to disclose and discuss suicidal ideas will improve Outcome: Progressing Goal: Ability to identify and utilize support systems that promote safety will improve Outcome: Progressing   Problem: Self-Concept: Goal: Will verbalize positive feelings about self Outcome: Progressing Goal: Level of anxiety will decrease Outcome: Progressing   Problem: Education: Goal: Knowledge of General Education information will improve Description: Including pain rating scale, medication(s)/side effects and non-pharmacologic comfort measures Outcome: Progressing   Problem: Health Behavior/Discharge Planning: Goal: Ability to manage health-related needs will improve Outcome: Progressing   Problem: Clinical Measurements: Goal: Ability to maintain clinical measurements within normal limits will improve Outcome: Progressing Goal: Will remain free from infection Outcome: Progressing Goal: Diagnostic test results will improve Outcome: Progressing Goal: Respiratory complications will improve Outcome: Progressing Goal: Cardiovascular  complication will be avoided Outcome: Progressing   Problem: Activity: Goal: Risk for activity intolerance will decrease Outcome: Progressing   Problem: Nutrition: Goal: Adequate nutrition will be maintained Outcome: Progressing   Problem: Coping: Goal: Level of anxiety will decrease Outcome: Progressing   Problem: Elimination: Goal: Will not experience complications related to bowel motility Outcome: Progressing Goal: Will not experience complications related to urinary retention Outcome: Progressing   Problem: Pain Managment: Goal: General experience of comfort will improve and/or be controlled Outcome: Progressing   Problem: Safety: Goal: Ability to remain free from injury will improve Outcome: Progressing   Problem: Skin Integrity: Goal: Risk for impaired skin integrity will decrease Outcome: Progressing   Problem: Education: Goal: Knowledge of General Education information will improve Description: Including pain rating scale, medication(s)/side effects and non-pharmacologic comfort measures Outcome: Progressing   Problem: Health Behavior/Discharge Planning: Goal: Ability to manage health-related needs will improve Outcome: Progressing   Problem: Clinical Measurements: Goal: Ability to maintain clinical measurements within normal limits will improve Outcome: Progressing Goal: Will remain free from infection Outcome: Progressing Goal: Diagnostic test results will improve Outcome: Progressing Goal: Respiratory complications will improve Outcome: Progressing Goal: Cardiovascular complication will be avoided Outcome: Progressing   Problem: Education: Goal: Utilization of techniques to improve thought processes will improve Outcome: Progressing Goal: Knowledge of the prescribed therapeutic regimen will improve Outcome: Progressing   Problem: Activity: Goal: Interest or engagement in leisure activities will improve Outcome: Progressing Goal: Imbalance in  normal sleep/wake cycle will improve Outcome: Progressing   Problem: Coping: Goal: Coping ability will improve Outcome: Progressing Goal: Will verbalize feelings Outcome: Progressing   Problem: Health Behavior/Discharge Planning: Goal: Ability to make decisions will improve Outcome: Progressing Goal: Compliance with therapeutic regimen will improve Outcome: Progressing   Problem: Role Relationship: Goal: Will demonstrate positive changes in social behaviors and relationships Outcome: Progressing   Problem: Safety: Goal: Ability to disclose and discuss suicidal ideas will improve Outcome: Progressing Goal: Ability to identify and utilize support systems that promote safety will improve Outcome: Progressing   Problem: Self-Concept: Goal: Will verbalize  positive feelings about self Outcome: Progressing Goal: Level of anxiety will decrease Outcome: Progressing

## 2023-11-05 NOTE — Progress Notes (Signed)
   11/05/23 0725  Psych Admission Type (Psych Patients Only)  Admission Status Voluntary  Psychosocial Assessment  Patient Complaints Anxiety  Eye Contact Brief  Facial Expression Blank  Affect Anxious  Speech Logical/coherent  Interaction Assertive  Motor Activity Slow  Appearance/Hygiene In scrubs  Behavior Characteristics Cooperative  Mood Pleasant  Thought Process  Coherency WDL  Content WDL  Delusions None reported or observed  Perception WDL  Hallucination None reported or observed  Judgment Impaired  Confusion None  Danger to Self  Current suicidal ideation? Denies  Danger to Others  Danger to Others None reported or observed

## 2023-11-05 NOTE — Inpatient Diabetes Management (Addendum)
 Inpatient Diabetes Program Recommendations  AACE/ADA: New Consensus Statement on Inpatient Glycemic Control (2015)  Target Ranges:  Prepandial:   less than 140 mg/dL      Peak postprandial:   less than 180 mg/dL (1-2 hours)      Critically ill patients:  140 - 180 mg/dL    Latest Reference Range & Units 11/04/23 07:46 11/04/23 11:35 11/04/23 16:54 11/04/23 19:22  Glucose-Capillary 70 - 99 mg/dL 829 (H)  2 units Novolog   137 (H)  1 unit Novolog   210 (H)  3 units Novolog   201 (H)  2 units Novolog    (H): Data is abnormally high  Latest Reference Range & Units 11/05/23 07:31 11/05/23 11:32  Glucose-Capillary 70 - 99 mg/dL 810 (H)  2 units Novolog   237 (H)  3 units Novolog    (H): Data is abnormally high    Home DM Meds: Levemir  30 units daily if CBG >200       Jardiance  25 mg daily  Current Orders: Novolog  Sensitive Correction Scale/ SSI (0-9 units) TID AC + HS     Jardiance  25 mg daily     MD- Note pt taking Levemir  30 units daily when CBG >200 at home  May consider adding low dose basal insulin  while inpatient:  Recommend Semglee 5 units daily to start (~0.1 units/kg)    --Will follow patient during hospitalization--  Adina Rudolpho Arrow RN, MSN, CDCES Diabetes Coordinator Inpatient Glycemic Control Team Team Pager: [phone] (8a-5p)

## 2023-11-05 NOTE — Progress Notes (Signed)
 [facility] MD Progress Note  11/05/2023 3:13 PM Kelly Doyle  MRN:  [id number]   Kelly Doyle is a 68 y/o F who is A&O x3 with PMH significant for MDD, DM2 requiring insulin , HTN, and CKD (stage 3). She initially presented to [facility] ED on on 11/01/23 for suicidal ideation and threatening bodily harm to herself with a knife. Her daughter stopped her and brought Kelly pt to [facility] ED for evaluation Patient is admitted to [facility] psych unit with Q15 min safety monitoring. Multidisciplinary team approach is offered. Medication management; group/milieu therapy is offered.   Subjective:  Chart reviewed, case discussed in multidisciplinary meeting, patient seen during rounds.  Patient is noted to be resting in bed.  She reports feeling tired during Kelly daytime.  Provider educated her that Kelly Zoloft  was increased to 50 mg to help with Kelly depression and anxiety.  Patient is aware that she is waiting for her check to be able to confirm Kelly boarding houses.  Provider informed Kelly patient that her discharge will be planned early next week after being monitor for any side effects with SSRIs.  Patient reports sleeping better and fair appetite.  She denies SI/HI/plan.  She continues to feel hopeless about her family abandoning her and her children not wanting to take care of her.  She denies auditory/visual hallucinations. Sleep: Fair  Appetite:  Fair  Past Psychiatric History: see h&P Family History:  Family History  Problem Relation Age of Onset   Alzheimer's disease Mother    Hypertension Mother    Diabetes Sister    Gout Brother    Social History:  Social History   Substance and Sexual Activity  Alcohol Use No     Social History   Substance and Sexual Activity  Drug Use No    Social History   Socioeconomic History   Marital status: Single    Spouse name: Not on file   Number of children: Not on file   Years of education: Not on file   Highest education level: Not on file  Occupational History   Not on  file  Tobacco Use   Smoking status: Never   Smokeless tobacco: Never  Vaping Use   Vaping status: Never Used  Substance and Sexual Activity   Alcohol use: No   Drug use: No   Sexual activity: Yes    Birth control/protection: None  Other Topics Concern   Not on file  Social History Narrative   Not on file   Social Drivers of Health   Financial Resource Strain: Not on file  Food Insecurity: No Food Insecurity (11/02/2023)   Hunger Vital Sign    Worried About Running Out of Food in Kelly Last Year: Never true    Ran Out of Food in Kelly Last Year: Never true  Transportation Needs: No Transportation Needs (11/02/2023)   PRAPARE - Administrator, Civil Service (Medical): No    Lack of Transportation (Non-Medical): No  Physical Activity: Not on file  Stress: Not on file  Social Connections: Moderately Isolated (11/02/2023)   Social Connection and Isolation Panel    Frequency of Communication with Friends and Family: Three times a week    Frequency of Social Gatherings with Friends and Family: More than three times a week    Attends Religious Services: 1 to 4 times per year    Active Member of [employer] or Organizations: No    Attends Banker Meetings: Never    Marital Status:  Divorced   Past Medical History:  Past Medical History:  Diagnosis Date   Anxiety    Arthritis    Diabetes mellitus without complication (HCC)    GERD (gastroesophageal reflux disease)    Hypertension     Past Surgical History:  Procedure Laterality Date   COLONOSCOPY WITH PROPOFOL  N/A 06/23/2017   Procedure: COLONOSCOPY WITH PROPOFOL ;  Surgeon: Toledo, Ladell POUR, MD;  Location: [facility];  Service: Gastroenterology;  Laterality: N/A;   DILATION AND CURETTAGE OF UTERUS  04/20/1973   NO PAST SURGERIES     TUBAL LIGATION      Current Medications: Current Facility-Administered Medications  Medication Dose Route Frequency Provider Last Rate Last Admin   acetaminophen  (TYLENOL )  tablet 650 mg  650 mg Oral Q6H PRN Ledger Heindl, MD       alum & mag hydroxide-simeth (MAALOX/MYLANTA) 200-200-20 MG/5ML suspension 30 mL  30 mL Oral Q4H PRN Devinne Epstein, MD       amLODipine  (NORVASC ) tablet 10 mg  10 mg Oral Daily Fayez Sturgell, MD   10 mg at 11/05/23 0957   atorvastatin  (LIPITOR) tablet 40 mg  40 mg Oral Daily Reyna Lorenzi, MD   40 mg at 11/05/23 0957   empagliflozin  (JARDIANCE ) tablet 25 mg  25 mg Oral Daily Sandy Blouch, MD   25 mg at 11/05/23 9041   ferrous sulfate  tablet 325 mg  325 mg Oral Q breakfast Sereniti Wan, MD   325 mg at 11/05/23 0957   hydrOXYzine  (ATARAX ) tablet 25 mg  25 mg Oral Q6H PRN Laren Whaling, MD   25 mg at 11/04/23 2159   insulin  aspart (novoLOG ) injection 0-5 Units  0-5 Units Subcutaneous QHS Zaydenn Balaguer, MD   2 Units at 11/04/23 2157   insulin  aspart (novoLOG ) injection 0-9 Units  0-9 Units Subcutaneous TID WC Zamora Colton, MD   3 Units at 11/05/23 1142   lisinopril  (ZESTRIL ) tablet 40 mg  40 mg Oral Daily Shuntel Fishburn, MD   40 mg at 11/04/23 0840   magnesium  hydroxide (MILK OF MAGNESIA) suspension 30 mL  30 mL Oral Daily PRN Donnelly Mellow, MD       OLANZapine  (ZYPREXA ) injection 5 mg  5 mg Intramuscular TID PRN Christl Fessenden, MD       OLANZapine  zydis (ZYPREXA ) disintegrating tablet 5 mg  5 mg Oral TID PRN Delainey Winstanley, MD       pantoprazole  (PROTONIX ) EC tablet 40 mg  40 mg Oral Daily Eluterio Seymour, MD   40 mg at 11/04/23 0840   sertraline  (ZOLOFT ) tablet 50 mg  50 mg Oral Daily Annamay Laymon, MD   50 mg at 11/05/23 9042   traZODone  (DESYREL ) tablet 50 mg  50 mg Oral QHS PRN Galan Ghee, MD   50 mg at 11/04/23 2158    Lab Results:  Results for orders placed or performed during Kelly Doyle encounter of 11/02/23 (from Kelly past 48 hours)  Glucose, capillary     Status: Abnormal   Collection Time: 11/03/23  4:33 PM  Result Value Ref Range   Glucose-Capillary 195 (H) 70 - 99 mg/dL    Comment: Glucose  reference range applies only to samples taken after fasting for at least 8 hours.  Glucose, capillary     Status: Abnormal   Collection Time: 11/03/23  8:00 PM  Result Value Ref Range   Glucose-Capillary 227 (H) 70 - 99 mg/dL    Comment: Glucose reference range applies only to samples taken after fasting for at  least 8 hours.  Glucose, capillary     Status: Abnormal   Collection Time: 11/04/23  7:46 AM  Result Value Ref Range   Glucose-Capillary 170 (H) 70 - 99 mg/dL    Comment: Glucose reference range applies only to samples taken after fasting for at least 8 hours.  Glucose, capillary     Status: Abnormal   Collection Time: 11/04/23 11:35 AM  Result Value Ref Range   Glucose-Capillary 137 (H) 70 - 99 mg/dL    Comment: Glucose reference range applies only to samples taken after fasting for at least 8 hours.  Glucose, capillary     Status: Abnormal   Collection Time: 11/04/23  4:54 PM  Result Value Ref Range   Glucose-Capillary 210 (H) 70 - 99 mg/dL    Comment: Glucose reference range applies only to samples taken after fasting for at least 8 hours.  Glucose, capillary     Status: Abnormal   Collection Time: 11/04/23  7:22 PM  Result Value Ref Range   Glucose-Capillary 201 (H) 70 - 99 mg/dL    Comment: Glucose reference range applies only to samples taken after fasting for at least 8 hours.  Glucose, capillary     Status: Abnormal   Collection Time: 11/05/23  7:31 AM  Result Value Ref Range   Glucose-Capillary 189 (H) 70 - 99 mg/dL    Comment: Glucose reference range applies only to samples taken after fasting for at least 8 hours.  Glucose, capillary     Status: Abnormal   Collection Time: 11/05/23 11:32 AM  Result Value Ref Range   Glucose-Capillary 237 (H) 70 - 99 mg/dL    Comment: Glucose reference range applies only to samples taken after fasting for at least 8 hours.    Blood Alcohol level:  Lab Results  Component Value Date   [facility] <15 11/01/2023   ETH <5 10/02/2014     Metabolic Disorder Labs: Lab Results  Component Value Date   HGBA1C 6.6 (H) 11/01/2023   MPG 143 11/01/2023   MPG 194.38 04/28/2021   No results found for: PROLACTIN Lab Results  Component Value Date   CHOL 143 04/28/2021   TRIG 71 04/28/2021   HDL 62 04/28/2021   CHOLHDL 2.3 04/28/2021   VLDL 14 04/28/2021   LDLCALC 67 04/28/2021   LDLCALC 73 07/09/2016    Physical Findings: AIMS:  , ,  ,  ,    CIWA:    COWS:      Psychiatric Specialty Exam:  Presentation  General Appearance:  Appropriate for Environment; Casual  Eye Contact: Fair  Speech: Clear and Coherent  Speech Volume: Normal    Mood and Affect  Mood: Depressed; Dysphoric  Affect: Depressed; Flat; Tearful   Thought Process  Thought Processes: Coherent  Descriptions of Associations:Intact  Orientation:Full (Time, Place and Person)  Thought Content:Logical  Hallucinations:No data recorded  Ideas of Reference:None  Suicidal Thoughts: Denies Homicidal Thoughts:No data recorded   Sensorium  Memory: Immediate Fair; Recent Fair; Remote Fair  Judgment: Impaired  Insight: Shallow   Executive Functions  Concentration: Fair  Attention Span: Fair  Recall: [employer] of Knowledge: Fair  Language: Fair   Psychomotor Activity  Psychomotor Activity: No data recorded  Musculoskeletal: Strength & Muscle Tone: within normal limits Gait & Station: normal Assets  Assets: Manufacturing systems engineer; Financial Resources/Insurance; Resilience    Physical Exam: Physical Exam Vitals and nursing note reviewed.    Review of Systems  Constitutional: Negative.   Eyes: Negative.  Blood pressure 118/69, pulse (!) 48, temperature 97.7 F (36.5 C), resp. rate 17, height 5' 2 (1.575 m), weight 68 kg, SpO2 100%. Body mass index is 27.42 kg/m.  Diagnosis: Principal Problem:   MDD (major depressive disorder), recurrent severe, without psychosis (HCC)   reatment Plan  Summary:   Safety and Monitoring:             -- Voluntary admission to inpatient psychiatric unit for safety, stabilization and treatment             -- Daily contact with patient to assess and evaluate symptoms and progress in treatment             -- Patient's case to be discussed in multi-disciplinary team meeting             -- Observation Level: q15 minute checks             -- Vital signs:  q12 hours             -- Precautions: suicide, elopement, and assault   2. Psychiatric Diagnoses and Treatment:                zoloft  increased to 50 mg daily   -- Kelly risks/benefits/side-effects/alternatives to this medication were discussed in detail with Kelly patient and time was given for questions. Kelly patient consents to medication trial.                -- Metabolic profile and EKG monitoring obtained while on an atypical antipsychotic (BMI: Lipid Panel: HbgA1c: QTc:)              -- Encouraged patient to participate in unit milieu and in scheduled group therapies                             4. Discharge Planning:   -- Social work and case management to assist with discharge planning and identification of Doyle follow-up needs prior to discharge  -- Estimated LOS: 3-4 days  Sacoya Mcgourty, MD 11/05/2023, 3:13 PM

## 2023-11-05 NOTE — Group Note (Signed)
 Recreation Therapy Group Note   Group Topic:Self-Esteem  Group Date: 11/05/2023 Start Time: 1405 End Time: 1500 Facilitators: Celestia Jeoffrey BRAVO, LRT, CTRS Location: Dayroom  Group Description: Positive Affirmation Bingo. LRT and patients played multiple games of Bingo with music playing in the background. LRT and pts discussed what a positive affirmation is, the importance of speaking kindly to yourself, and the use of this as a coping skill.   Goal Area(s) Addressed: Patient will learn positive affirmations.  Patient will engage in recreation activity.  Patient will increase communication.    Affect/Mood: Appropriate   Participation Level: Active and Engaged   Participation Quality: Independent   Behavior: Appropriate, Calm, and Cooperative   Speech/Thought Process: Coherent   Insight: Good   Judgement: Good   Modes of Intervention: Competitive Play and Education   Patient Response to Interventions:  Attentive, Engaged, Interested , and Receptive   Education Outcome:  Acknowledges education   Clinical Observations/Individualized Feedback: [name] was active in their participation of session activities and group discussion. Pt won a Facilities manager and received a soda as a Scientist, research (physical sciences). Pt interacted well with LRT and peers duration of session.    Plan: Continue to engage patient in RT group sessions 2-3x/week.   Jeoffrey BRAVO Celestia, LRT, CTRS 11/05/2023 5:00 PM

## 2023-11-05 NOTE — Group Note (Signed)
 Date:  11/05/2023 Time:  9:00 PM  Group Topic/Focus:  Self Care:   The focus of this group is to help patients understand the importance of self-care in order to improve or restore emotional, physical, spiritual, interpersonal, and financial health.  MHT reviewed rules and expectations of the unit. MHT informed patients of 15 minute rounding. MHT informed doors are usually left slightly cracked to avoid hearing the clicking of the doors opening every 15 minutes. MHT informed patients not to be alarmed if they see someone looking in at night, it was only to check for their safety.  MHT group topic was wellness.  MHT explained while on the unit, patients have access to counselors and doctors. MHT encouraged continued treatment upon discharge. MHT informed of outpatient services that were available at [facility]. MHT informed patients of open access and how to get linked with outpatient services. MHT encouraged patients to continue taking medications as prescribed. MHT explained how some people will start doing well on medications and feel they no longer need, then will decompensate when stopping the medication.  MHT provided group with resources available in the community. MHT informed of the peer living room that is available to the community at [facility]. MHT informed they have access to the internet, daily groups, and a peer to assist with needs while there. MHT provided the address and telephone number. [address] [place] [phone]  Participation Level:  Active  Participation Quality:  Appropriate  Affect:  Appropriate  Cognitive:  Appropriate  Insight: Appropriate  Engagement in Group:  Engaged  Modes of Intervention:  Discussion  Additional Comments:    Kelly Doyle 11/05/2023, 9:00 PM

## 2023-11-05 NOTE — Group Note (Signed)
 Date:  11/05/2023 Time:  11:16 AM  Group Topic/Focus:  Community Meeting    Participation Level:  Did Not Attend    Kelly Doyle 11/05/2023, 11:16 AM

## 2023-11-05 NOTE — Group Note (Signed)
 Date:  11/05/2023 Time:  1:38 AM  Group Topic/Focus:  Coping With Mental Health Crisis:   The purpose of this group is to help patients identify strategies for coping with mental health crisis.  Group discusses possible causes of crisis and ways to manage them effectively.    Participation Level:  Active  Participation Quality:  Appropriate  Affect:  Appropriate  Cognitive:  Appropriate  Insight: Appropriate  Engagement in Group:  Engaged  Modes of Intervention:  Education  Additional Comments:    Laymon ONEIDA Finder 11/05/2023, 1:38 AM

## 2023-11-05 NOTE — Group Note (Signed)
 LCSW Group Therapy Note  Group Date: 11/05/2023 Start Time: 1515 End Time: 1600   Type of Therapy and Topic:  Group Therapy: Using I Statements  Participation Level:  Minimal  Description of Group:  Patients were asked to provide details of some interpersonal conflicts they have experienced. Patients were then educated about "I" statements, communication which focuses on feelings or views of the speaker rather than what the other person is doing. T group members were asked to reflect on past conflicts and to provide specific examples for utilizing "I" statements.  Therapeutic Goals:  Patients will verbalize understanding of ineffective communication and effective communication. Patients will be able to empathize with whom they are having conflict. Patients will practice effective communication in the form of "I" statements.    Summary of Patient Progress:  Kelly Doyle shared hesitation to communicate because she feels her family does not listen . The patient was present/active throughout the session and proved open to feedback from CSW and peers. Patient demonstrated fair insight into the subject matter, was respectful of peers, and was present throughout the entire session.  Therapeutic Modalities:   Cognitive Behavioral Therapy Solution-Focused Therapy    Lum [name], LCSW 11/05/2023  4:20 PM

## 2023-11-05 NOTE — Plan of Care (Signed)
 Problem: Education: Goal: Utilization of techniques to improve thought processes will improve Outcome: Progressing Goal: Knowledge of the prescribed therapeutic regimen will improve Outcome: Progressing   Problem: Activity: Goal: Interest or engagement in leisure activities will improve Outcome: Progressing Goal: Imbalance in normal sleep/wake cycle will improve Outcome: Progressing   Problem: Coping: Goal: Coping ability will improve Outcome: Progressing Goal: Will verbalize feelings Outcome: Progressing   Problem: Health Behavior/Discharge Planning: Goal: Ability to make decisions will improve Outcome: Progressing Goal: Compliance with therapeutic regimen will improve Outcome: Progressing   Problem: Role Relationship: Goal: Will demonstrate positive changes in social behaviors and relationships Outcome: Progressing   Problem: Safety: Goal: Ability to disclose and discuss suicidal ideas will improve Outcome: Progressing Goal: Ability to identify and utilize support systems that promote safety will improve Outcome: Progressing   Problem: Self-Concept: Goal: Will verbalize positive feelings about self Outcome: Progressing Goal: Level of anxiety will decrease Outcome: Progressing   Problem: Education: Goal: Knowledge of General Education information will improve Description: Including pain rating scale, medication(s)/side effects and non-pharmacologic comfort measures Outcome: Progressing   Problem: Health Behavior/Discharge Planning: Goal: Ability to manage health-related needs will improve Outcome: Progressing   Problem: Clinical Measurements: Goal: Ability to maintain clinical measurements within normal limits will improve Outcome: Progressing Goal: Will remain free from infection Outcome: Progressing Goal: Diagnostic test results will improve Outcome: Progressing Goal: Respiratory complications will improve Outcome: Progressing Goal: Cardiovascular  complication will be avoided Outcome: Progressing   Problem: Activity: Goal: Risk for activity intolerance will decrease Outcome: Progressing   Problem: Nutrition: Goal: Adequate nutrition will be maintained Outcome: Progressing   Problem: Coping: Goal: Level of anxiety will decrease Outcome: Progressing   Problem: Elimination: Goal: Will not experience complications related to bowel motility Outcome: Progressing Goal: Will not experience complications related to urinary retention Outcome: Progressing   Problem: Pain Managment: Goal: General experience of comfort will improve and/or be controlled Outcome: Progressing   Problem: Safety: Goal: Ability to remain free from injury will improve Outcome: Progressing   Problem: Skin Integrity: Goal: Risk for impaired skin integrity will decrease Outcome: Progressing   Problem: Education: Goal: Knowledge of General Education information will improve Description: Including pain rating scale, medication(s)/side effects and non-pharmacologic comfort measures Outcome: Progressing   Problem: Health Behavior/Discharge Planning: Goal: Ability to manage health-related needs will improve Outcome: Progressing   Problem: Clinical Measurements: Goal: Ability to maintain clinical measurements within normal limits will improve Outcome: Progressing Goal: Will remain free from infection Outcome: Progressing Goal: Diagnostic test results will improve Outcome: Progressing Goal: Respiratory complications will improve Outcome: Progressing Goal: Cardiovascular complication will be avoided Outcome: Progressing   Problem: Education: Goal: Utilization of techniques to improve thought processes will improve Outcome: Progressing Goal: Knowledge of the prescribed therapeutic regimen will improve Outcome: Progressing   Problem: Activity: Goal: Interest or engagement in leisure activities will improve Outcome: Progressing Goal: Imbalance in  normal sleep/wake cycle will improve Outcome: Progressing   Problem: Coping: Goal: Coping ability will improve Outcome: Progressing Goal: Will verbalize feelings Outcome: Progressing   Problem: Health Behavior/Discharge Planning: Goal: Ability to make decisions will improve Outcome: Progressing Goal: Compliance with therapeutic regimen will improve Outcome: Progressing   Problem: Role Relationship: Goal: Will demonstrate positive changes in social behaviors and relationships Outcome: Progressing   Problem: Safety: Goal: Ability to disclose and discuss suicidal ideas will improve Outcome: Progressing Goal: Ability to identify and utilize support systems that promote safety will improve Outcome: Progressing   Problem: Self-Concept: Goal: Will verbalize  positive feelings about self Outcome: Progressing Goal: Level of anxiety will decrease Outcome: Progressing  Patient is alert and oriented times 4. Mood and affect appropriate. Patient denies pain. She denies SI, HI, and AVH. Also denies feelings of anxiety and depression at this time. States she slept good last night. Morning meds given whole by mouth W/O difficulty. Ate breakfast in day room- appetite good. Patient remains on unit with Q15 minute checks in place.

## 2023-11-06 DIAGNOSIS — F332 Major depressive disorder, recurrent severe without psychotic features: Secondary | ICD-10-CM | POA: Diagnosis not present

## 2023-11-06 LAB — GLUCOSE, CAPILLARY
Glucose-Capillary: 185 mg/dL — ABNORMAL HIGH (ref 70–99)
Glucose-Capillary: 238 mg/dL — ABNORMAL HIGH (ref 70–99)
Glucose-Capillary: 167 mg/dL — ABNORMAL HIGH (ref 70–99)

## 2023-11-06 MED ORDER — TRAZODONE HCL 50 MG PO TABS
50.0000 mg | ORAL_TABLET | ORAL | Status: DC
  Administered 2023-11-06 – 2023-11-08 (×3): 50 mg via ORAL
  Filled 2023-11-06 (×3): qty 1

## 2023-11-06 NOTE — Progress Notes (Signed)
 [facility] MD Progress Note  11/06/2023 11:49 AM Kelly Doyle  MRN:  [id number]   Kelly Doyle is a 68 y/o F who is A&O x3 with PMH significant for MDD, DM2 requiring insulin , HTN, and CKD (stage 3). She initially presented to the [facility] ED on on 11/01/23 for suicidal ideation and threatening bodily harm to herself with a knife. Her daughter stopped her and brought the pt to the ED for evaluation Patient is admitted to [facility] psych unit with Q15 min safety monitoring. Multidisciplinary team approach is offered. Medication management; group/milieu therapy is offered.   Subjective:  Chart reviewed, case discussed in multidisciplinary meeting, Patient is noted to be very sedated and had hard time waking up for the interview.  Eventually she was able to participate in the interview saying last night she was given medications to sleep late in the night.  On chart review patient received both hydroxyzine  and trazodone  as needed medications at the same time.  This is second day in a row where patient received these 2 PRNs in the night which keeps her very sedated for the rest of the day.  Hydroxyzine  as needed order is discontinued and trazodone  is scheduled to be given at 8:00 PM.  She denies SI/HI/plan.  She is taking Zoloft  increased dose to 50 mg and offers no complaints.  She remains anxious and hopeless about her living situation and her family not helping her or checking on her.  She denies auditory/visual hallucinations.    Sleep: Fair  Appetite:  Fair  Past Psychiatric History: see h&P Family History:  Family History  Problem Relation Age of Onset   Alzheimer's disease Mother    Hypertension Mother    Diabetes Sister    Gout Brother    Social History:  Social History   Substance and Sexual Activity  Alcohol Use No     Social History   Substance and Sexual Activity  Drug Use No    Social History   Socioeconomic History   Marital status: Single    Spouse name: Not on file   Number of  children: Not on file   Years of education: Not on file   Highest education level: Not on file  Occupational History   Not on file  Tobacco Use   Smoking status: Never   Smokeless tobacco: Never  Vaping Use   Vaping status: Never Used  Substance and Sexual Activity   Alcohol use: No   Drug use: No   Sexual activity: Yes    Birth control/protection: None  Other Topics Concern   Not on file  Social History Narrative   Not on file   Social Drivers of Health   Financial Resource Strain: Not on file  Food Insecurity: No Food Insecurity (11/02/2023)   Hunger Vital Sign    Worried About Running Out of Food in the Last Year: Never true    Ran Out of Food in the Last Year: Never true  Transportation Needs: No Transportation Needs (11/02/2023)   PRAPARE - Administrator, Civil Service (Medical): No    Lack of Transportation (Non-Medical): No  Physical Activity: Not on file  Stress: Not on file  Social Connections: Moderately Isolated (11/02/2023)   Social Connection and Isolation Panel    Frequency of Communication with Friends and Family: Three times a week    Frequency of Social Gatherings with Friends and Family: More than three times a week    Attends Religious Services: 1 to 4  times per year    Active Member of Clubs or Organizations: No    Attends Banker Meetings: Never    Marital Status: Divorced   Past Medical History:  Past Medical History:  Diagnosis Date   Anxiety    Arthritis    Diabetes mellitus without complication (HCC)    GERD (gastroesophageal reflux disease)    Hypertension     Past Surgical History:  Procedure Laterality Date   COLONOSCOPY WITH PROPOFOL  N/A 06/23/2017   Procedure: COLONOSCOPY WITH PROPOFOL ;  Surgeon: Toledo, Ladell POUR, MD;  Location: [facility] ENDOSCOPY;  Service: Gastroenterology;  Laterality: N/A;   DILATION AND CURETTAGE OF UTERUS  04/20/1973   NO PAST SURGERIES     TUBAL LIGATION      Current  Medications: Current Facility-Administered Medications  Medication Dose Route Frequency Provider Last Rate Last Admin   acetaminophen  (TYLENOL ) tablet 650 mg  650 mg Oral Q6H PRN Damisha Wolff, MD       alum & mag hydroxide-simeth (MAALOX/MYLANTA) 200-200-20 MG/5ML suspension 30 mL  30 mL Oral Q4H PRN Deangelo Berns, MD       amLODipine  (NORVASC ) tablet 10 mg  10 mg Oral Daily Norfleet Capers, MD   10 mg at 11/06/23 1021   atorvastatin  (LIPITOR) tablet 40 mg  40 mg Oral Daily Cataleah Stites, MD   40 mg at 11/06/23 1021   empagliflozin  (JARDIANCE ) tablet 25 mg  25 mg Oral Daily Binnie Droessler, MD   25 mg at 11/06/23 1022   ferrous sulfate  tablet 325 mg  325 mg Oral Q breakfast Apryll Hinkle, MD   325 mg at 11/06/23 1022   hydrOXYzine  (ATARAX ) tablet 25 mg  25 mg Oral Q6H PRN Julieanne Hadsall, MD   25 mg at 11/05/23 2134   insulin  aspart (novoLOG ) injection 0-5 Units  0-5 Units Subcutaneous QHS Juana Haralson, MD   4 Units at 11/05/23 2132   insulin  aspart (novoLOG ) injection 0-9 Units  0-9 Units Subcutaneous TID WC Jabrea Kallstrom, MD   2 Units at 11/06/23 9182   lisinopril  (ZESTRIL ) tablet 40 mg  40 mg Oral Daily Dotty Gonzalo, MD   40 mg at 11/06/23 1022   magnesium  hydroxide (MILK OF MAGNESIA) suspension 30 mL  30 mL Oral Daily PRN Donnelly Mellow, MD       OLANZapine  (ZYPREXA ) injection 5 mg  5 mg Intramuscular TID PRN Sueo Cullen, MD       OLANZapine  zydis (ZYPREXA ) disintegrating tablet 5 mg  5 mg Oral TID PRN Lizzette Carbonell, MD       pantoprazole  (PROTONIX ) EC tablet 40 mg  40 mg Oral Daily Terry Bolotin, MD   40 mg at 11/06/23 1022   sertraline  (ZOLOFT ) tablet 50 mg  50 mg Oral Daily Emani Taussig, MD   50 mg at 11/06/23 1021   traZODone  (DESYREL ) tablet 50 mg  50 mg Oral QHS PRN Latesa Fratto, MD   50 mg at 11/05/23 2133    Lab Results:  Results for orders placed or performed during the hospital encounter of 11/02/23 (from the past 48 hours)  Glucose, capillary      Status: Abnormal   Collection Time: 11/04/23  4:54 PM  Result Value Ref Range   Glucose-Capillary 210 (H) 70 - 99 mg/dL    Comment: Glucose reference range applies only to samples taken after fasting for at least 8 hours.  Glucose, capillary     Status: Abnormal   Collection Time: 11/04/23  7:22 PM  Result Value  Ref Range   Glucose-Capillary 201 (H) 70 - 99 mg/dL    Comment: Glucose reference range applies only to samples taken after fasting for at least 8 hours.  Glucose, capillary     Status: Abnormal   Collection Time: 11/05/23  7:31 AM  Result Value Ref Range   Glucose-Capillary 189 (H) 70 - 99 mg/dL    Comment: Glucose reference range applies only to samples taken after fasting for at least 8 hours.  Glucose, capillary     Status: Abnormal   Collection Time: 11/05/23 11:32 AM  Result Value Ref Range   Glucose-Capillary 237 (H) 70 - 99 mg/dL    Comment: Glucose reference range applies only to samples taken after fasting for at least 8 hours.  Glucose, capillary     Status: Abnormal   Collection Time: 11/05/23  4:54 PM  Result Value Ref Range   Glucose-Capillary 155 (H) 70 - 99 mg/dL    Comment: Glucose reference range applies only to samples taken after fasting for at least 8 hours.  Glucose, capillary     Status: Abnormal   Collection Time: 11/05/23  9:11 PM  Result Value Ref Range   Glucose-Capillary 330 (H) 70 - 99 mg/dL    Comment: Glucose reference range applies only to samples taken after fasting for at least 8 hours.   Comment 1 Notify RN   Glucose, capillary     Status: Abnormal   Collection Time: 11/06/23  7:25 AM  Result Value Ref Range   Glucose-Capillary 185 (H) 70 - 99 mg/dL    Comment: Glucose reference range applies only to samples taken after fasting for at least 8 hours.    Blood Alcohol level:  Lab Results  Component Value Date   [facility] <15 11/01/2023   ETH <5 10/02/2014    Metabolic Disorder Labs: Lab Results  Component Value Date   HGBA1C 6.6 (H)  11/01/2023   MPG 143 11/01/2023   MPG 194.38 04/28/2021   No results found for: PROLACTIN Lab Results  Component Value Date   CHOL 143 04/28/2021   TRIG 71 04/28/2021   HDL 62 04/28/2021   CHOLHDL 2.3 04/28/2021   VLDL 14 04/28/2021   LDLCALC 67 04/28/2021   LDLCALC 73 07/09/2016    Physical Findings: AIMS:  , ,  ,  ,    CIWA:    COWS:      Psychiatric Specialty Exam:  Presentation  General Appearance:  Appropriate for Environment; Casual  Eye Contact: Fair  Speech: Clear and Coherent  Speech Volume: Normal    Mood and Affect  Mood: Depressed; Dysphoric  Affect: Depressed; Flat; Tearful   Thought Process  Thought Processes: Coherent  Descriptions of Associations:Intact  Orientation:Full (Time, Place and Person)  Thought Content:Logical  Hallucinations: Denied  Ideas of Reference:None  Suicidal Thoughts: Denies Homicidal Thoughts: Denies   Sensorium  Memory: Immediate Fair; Recent Fair; Remote Fair  Judgment: Impaired  Insight: Shallow   Executive Functions  Concentration: Fair  Attention Span: Fair  Recall: [employer] of Knowledge: Fair  Language: Fair   Psychomotor Activity  Psychomotor Activity: No data recorded  Musculoskeletal: Strength & Muscle Tone: within normal limits Gait & Station: normal Assets  Assets: Manufacturing systems engineer; Financial Resources/Insurance; Resilience    Physical Exam: Physical Exam Vitals and nursing note reviewed.    Review of Systems  Constitutional: Negative.   Eyes: Negative.    Blood pressure 128/72, pulse 100, temperature (!) 97.3 F (36.3 C), resp. rate 18,  height 5' 2 (1.575 m), weight 68 kg, SpO2 100%. Body mass index is 27.42 kg/m.  Diagnosis: Principal Problem:   MDD (major depressive disorder), recurrent severe, without psychosis (HCC)   reatment Plan Summary:   Safety and Monitoring:             -- Voluntary admission to inpatient psychiatric unit  for safety, stabilization and treatment             -- Daily contact with patient to assess and evaluate symptoms and progress in treatment             -- Patient's case to be discussed in multi-disciplinary team meeting             -- Observation Level: q15 minute checks             -- Vital signs:  q12 hours             -- Precautions: suicide, elopement, and assault   2. Psychiatric Diagnoses and Treatment:                zoloft  increased to 50 mg daily Schedule trazodone  50 mg nightly at 8 PM and reviewed hydroxyzine  as needed order as patient was receiving both medications at the same time causing daytime sedation   -- The risks/benefits/side-effects/alternatives to this medication were discussed in detail with the patient and time was given for questions. The patient consents to medication trial.                -- Metabolic profile and EKG monitoring obtained while on an atypical antipsychotic (BMI: Lipid Panel: HbgA1c: QTc:)              -- Encouraged patient to participate in unit milieu and in scheduled group therapies                             4. Discharge Planning:   -- Social work and case management to assist with discharge planning and identification of hospital follow-up needs prior to discharge  -- Estimated LOS: 3-4 days  Allyn Foil, MD 11/06/2023, 11:49 AM

## 2023-11-06 NOTE — Plan of Care (Signed)
   Problem: Self-Concept: Goal: Will verbalize positive feelings about self Outcome: Progressing Goal: Level of anxiety will decrease Outcome: Progressing

## 2023-11-06 NOTE — Progress Notes (Signed)
   11/06/23 1100  Psych Admission Type (Psych Patients Only)  Admission Status Voluntary  Psychosocial Assessment  Patient Complaints Depression  Eye Contact Brief  Facial Expression Blank  Affect Appropriate to circumstance  Speech Logical/coherent  Interaction Assertive  Motor Activity Slow  Appearance/Hygiene In scrubs  Behavior Characteristics Cooperative  Mood Depressed  Thought Process  Coherency WDL  Content WDL  Delusions None reported or observed  Perception WDL  Hallucination None reported or observed  Judgment Impaired  Confusion None  Danger to Self  Current suicidal ideation? Denies  Danger to Others  Danger to Others None reported or observed

## 2023-11-06 NOTE — Group Note (Signed)
 Date:  11/06/2023 Time:  11:15 AM  Group Topic/Focus:  Outside Rec/Music Therapy  The purpose of this group is to allow patients to go out and enjoy the outdoors while participating in outside activities interacting with peers and listening to their favorite tunes.    Participation Level:  Minimal  Kelly Doyle Kelly Doyle 11/06/2023, 11:15 AM

## 2023-11-06 NOTE — Group Note (Signed)
 Date:  11/06/2023 Time:  9:43 PM  Group Topic/Focus:  Developing a Wellness Toolbox:   The focus of this group is to help patients develop a wellness toolbox with skills and strategies to promote recovery upon discharge. Identifying Needs:   The focus of this group is to help patients identify their personal needs that have been historically problematic and identify healthy behaviors to address their needs.  MHT started group with an ice breaker, telling 2 true statements and 1 false, and the group trying to figure out which was false. Group enjoyed ice breaker, with 100% participation. MHT opened group with any concerns they wanted addressed. Group chose to talk about what to do after discharge. MHT recapped some things from the previous night, about linkage to mental health services and continued treatment with physicians and therapist.  MHT addressed concerns about finding resources available in the community to address needs. MHT informed of 988 and 211 phone services. MHT informed how to find available resources, such as housing, food, utility assistance, etc by calling 211. MHT explained what to expect when calling. MHT called to model how to find resources available. MHT asked what resources they wanted to look for. Group chose food stamps/food. MHT called 211, put on speaker for the group to hear the call, and requested information on how to get food stamps and available food in the community. MHT answered the questions, and  showed group how to follow the prompts. MHT was given these resources: Food and nutrition services [phone] [facility] [phone] Friday 10-11am [address] [place], [place] [address] [employer] [phone] Wednesday 9-10am [address], [place] [address] In addition, MHT informed by calling 211, they can link to other services, such as AA meetings available in the community. MHT informed of additional ways  to find resources in the community by going on-line to [employer]. MHT informed they could complete the request on-line by providing name, telephone number, and email address. MHT informed they had categories they could choose from and make a note of what they were specifically looking for assistance with.  MHT processed with group about dealing with stressors, by focusing on the solution as opposed to the problem. MHT provided supportive counseling to address changing the way they think, to change the way they react to situations. MHT informed they could not change the things that happen, but they can change the way they look at the situation, to change the way they respond to it. MHT gave an example of a half full/empty cup. MHT explained looking at it half full had a different feeling than looking at it half empty. MHT explained how focusing on it in a negative way determined how they felt about it. Group was receptive and stated they would start focusing on solutions instead of focusing in on the problem. MHT addressed specific concerns of what to do when feeling like relapsing. MHT encouraged group to find out the triggers, warning signs, and developing coping skills. MHT explained making a list of the people that help keep them calm and help them work through difficult situations would be helpful to have, so they can easily access them in need. MHT suggested finding a sponsor as well, to help work through the difficult times. MHT encouraged group to do what was needed and not what was convenient or easy. MHT encouraged group to consult with their care teams to develop a crisis plan to follow upon discharge, so  they know what they need to do to remain safe and healthy upon discharge.  Participation Level:  Active  Participation Quality:  Appropriate  Affect:  Appropriate  Cognitive:  Appropriate  Insight: Appropriate  Engagement in Group:  Engaged  Modes of Intervention:   Discussion  Additional Comments:    Kelly Doyle 11/06/2023, 9:43 PM

## 2023-11-07 DIAGNOSIS — F332 Major depressive disorder, recurrent severe without psychotic features: Secondary | ICD-10-CM | POA: Diagnosis not present

## 2023-11-07 LAB — GLUCOSE, CAPILLARY
Glucose-Capillary: 195 mg/dL — ABNORMAL HIGH (ref 70–99)
Glucose-Capillary: 262 mg/dL — ABNORMAL HIGH (ref 70–99)
Glucose-Capillary: 159 mg/dL — ABNORMAL HIGH (ref 70–99)
Glucose-Capillary: 167 mg/dL — ABNORMAL HIGH (ref 70–99)

## 2023-11-07 NOTE — Group Note (Signed)
 Date:  11/07/2023 Time:  11:27 AM  Group Topic/Focus:  Outside Rec/Music Therapy The purpose of this group is for patients to get out and get fresh air while doing outside activities and socializing with other peers.     Participation Level:  Did Not Attend  [name] 11/07/2023, 11:27 AM

## 2023-11-07 NOTE — Progress Notes (Signed)
   11/06/23 2200  Psych Admission Type (Psych Patients Only)  Admission Status Voluntary  Psychosocial Assessment  Patient Complaints Depression  Eye Contact Brief  Facial Expression Flat  Affect Appropriate to circumstance  Speech Logical/coherent  Interaction Assertive  Motor Activity Slow  Appearance/Hygiene In scrubs  Behavior Characteristics Calm  Mood Depressed  Thought Process  Coherency WDL  Content WDL  Delusions None reported or observed  Perception WDL  Hallucination None reported or observed  Judgment Impaired  Confusion None  Danger to Self  Current suicidal ideation? Denies

## 2023-11-07 NOTE — Group Note (Signed)
 Date:  11/07/2023 Time:  9:29 PM  Group Topic/Focus:  Wrap-Up Group:   The focus of this group is to help patients review their daily goal of treatment and discuss progress on daily workbooks.    Participation Level:  Active  Participation Quality:  Appropriate  Affect:  Appropriate  Cognitive:  Alert  Insight: Appropriate  Engagement in Group:  Engaged  Modes of Intervention:  Discussion  Additional Comments:    Kelly Doyle 11/07/2023, 9:29 PM

## 2023-11-07 NOTE — Plan of Care (Signed)
  Problem: Education: Goal: Utilization of techniques to improve thought processes will improve Outcome: Progressing   Problem: Activity: Goal: Interest or engagement in leisure activities will improve Outcome: Progressing   Problem: Coping: Goal: Coping ability will improve Outcome: Progressing Goal: Will verbalize feelings Outcome: Progressing   Problem: Coping: Goal: Coping ability will improve Outcome: Progressing

## 2023-11-07 NOTE — Progress Notes (Signed)
 [facility] MD Progress Note  11/07/2023 3:15 PM Kelly Doyle  MRN:  [id number]   Kelly Doyle is a 68 y/o F who is A&O x3 with PMH significant for MDD, DM2 requiring insulin , HTN, and CKD (stage 3). She initially presented to the [facility] ED on on 11/01/23 for suicidal ideation and threatening bodily harm to herself with a knife. Her daughter stopped her and brought the pt to the ED for evaluation Patient is admitted to [facility] psych unit with Q15 min safety monitoring. Multidisciplinary team approach is offered. Medication management; group/milieu therapy is offered.   Subjective:  Chart reviewed, case discussed in multidisciplinary meeting, patient is noted to be more awake today.  Patient was appreciated to work cutting down the as needed medications in the night.  She is seen to be reading a book and engaging in therapy sessions.  She remains anxious about being homeless and depressed about her family/kids not caring for her.  Provider encouraged her to look into boarding houses.  Patient is tolerating Zoloft  with no reported side effects.  She denies SI/HI/plan and denies hallucinations.    Sleep: Fair  Appetite:  Fair  Past Psychiatric History: see h&P Family History:  Family History  Problem Relation Age of Onset   Alzheimer's disease Mother    Hypertension Mother    Diabetes Sister    Gout Brother    Social History:  Social History   Substance and Sexual Activity  Alcohol Use No     Social History   Substance and Sexual Activity  Drug Use No    Social History   Socioeconomic History   Marital status: Single    Spouse name: Not on file   Number of children: Not on file   Years of education: Not on file   Highest education level: Not on file  Occupational History   Not on file  Tobacco Use   Smoking status: Never   Smokeless tobacco: Never  Vaping Use   Vaping status: Never Used  Substance and Sexual Activity   Alcohol use: No   Drug use: No   Sexual activity: Yes    Birth  control/protection: None  Other Topics Concern   Not on file  Social History Narrative   Not on file   Social Drivers of Health   Financial Resource Strain: Not on file  Food Insecurity: No Food Insecurity (11/02/2023)   Hunger Vital Sign    Worried About Running Out of Food in the Last Year: Never true    Ran Out of Food in the Last Year: Never true  Transportation Needs: No Transportation Needs (11/02/2023)   PRAPARE - Administrator, Civil Service (Medical): No    Lack of Transportation (Non-Medical): No  Physical Activity: Not on file  Stress: Not on file  Social Connections: Moderately Isolated (11/02/2023)   Social Connection and Isolation Panel    Frequency of Communication with Friends and Family: Three times a week    Frequency of Social Gatherings with Friends and Family: More than three times a week    Attends Religious Services: 1 to 4 times per year    Active Member of [employer] or Organizations: No    Attends Banker Meetings: Never    Marital Status: Divorced   Past Medical History:  Past Medical History:  Diagnosis Date   Anxiety    Arthritis    Diabetes mellitus without complication (HCC)    GERD (gastroesophageal reflux disease)  Hypertension     Past Surgical History:  Procedure Laterality Date   COLONOSCOPY WITH PROPOFOL  N/A 06/23/2017   Procedure: COLONOSCOPY WITH PROPOFOL ;  Surgeon: Toledo, Ladell POUR, MD;  Location: [facility] ENDOSCOPY;  Service: Gastroenterology;  Laterality: N/A;   DILATION AND CURETTAGE OF UTERUS  04/20/1973   NO PAST SURGERIES     TUBAL LIGATION      Current Medications: Current Facility-Administered Medications  Medication Dose Route Frequency Provider Last Rate Last Admin   acetaminophen  (TYLENOL ) tablet 650 mg  650 mg Oral Q6H PRN Elle Vezina, MD       alum & mag hydroxide-simeth (MAALOX/MYLANTA) 200-200-20 MG/5ML suspension 30 mL  30 mL Oral Q4H PRN Kenyada Hy, MD       amLODipine  (NORVASC ) tablet  10 mg  10 mg Oral Daily Truong Delcastillo, MD   10 mg at 11/07/23 9094   atorvastatin  (LIPITOR) tablet 40 mg  40 mg Oral Daily Kidada Ging, MD   40 mg at 11/07/23 9094   empagliflozin  (JARDIANCE ) tablet 25 mg  25 mg Oral Daily Misael Mcgaha, MD   25 mg at 11/07/23 9094   ferrous sulfate  tablet 325 mg  325 mg Oral Q breakfast Ronon Ferger, MD   325 mg at 11/07/23 9094   insulin  aspart (novoLOG ) injection 0-5 Units  0-5 Units Subcutaneous QHS Mega Kinkade, MD   4 Units at 11/05/23 2132   insulin  aspart (novoLOG ) injection 0-9 Units  0-9 Units Subcutaneous TID WC Janoah Menna, MD   5 Units at 11/07/23 1155   lisinopril  (ZESTRIL ) tablet 40 mg  40 mg Oral Daily Yisrael Obryan, MD   40 mg at 11/07/23 0905   magnesium  hydroxide (MILK OF MAGNESIA) suspension 30 mL  30 mL Oral Daily PRN Donnelly Mellow, MD       OLANZapine  (ZYPREXA ) injection 5 mg  5 mg Intramuscular TID PRN Suhaila Troiano, MD       OLANZapine  zydis (ZYPREXA ) disintegrating tablet 5 mg  5 mg Oral TID PRN Ceriah Kohler, MD       pantoprazole  (PROTONIX ) EC tablet 40 mg  40 mg Oral Daily Itzia Cunliffe, MD   40 mg at 11/07/23 9094   sertraline  (ZOLOFT ) tablet 50 mg  50 mg Oral Daily Karina Lenderman, MD   50 mg at 11/07/23 9094   traZODone  (DESYREL ) tablet 50 mg  50 mg Oral Daily Ketzaly Cardella, MD   50 mg at 11/06/23 2055    Lab Results:  Results for orders placed or performed during the hospital encounter of 11/02/23 (from the past 48 hours)  Glucose, capillary     Status: Abnormal   Collection Time: 11/05/23  4:54 PM  Result Value Ref Range   Glucose-Capillary 155 (H) 70 - 99 mg/dL    Comment: Glucose reference range applies only to samples taken after fasting for at least 8 hours.  Glucose, capillary     Status: Abnormal   Collection Time: 11/05/23  9:11 PM  Result Value Ref Range   Glucose-Capillary 330 (H) 70 - 99 mg/dL    Comment: Glucose reference range applies only to samples taken after fasting for at least  8 hours.   Comment 1 Notify RN   Glucose, capillary     Status: Abnormal   Collection Time: 11/06/23  7:25 AM  Result Value Ref Range   Glucose-Capillary 185 (H) 70 - 99 mg/dL    Comment: Glucose reference range applies only to samples taken after fasting for at least 8 hours.  Glucose,  capillary     Status: Abnormal   Collection Time: 11/06/23  4:17 PM  Result Value Ref Range   Glucose-Capillary 238 (H) 70 - 99 mg/dL    Comment: Glucose reference range applies only to samples taken after fasting for at least 8 hours.  Glucose, capillary     Status: Abnormal   Collection Time: 11/06/23  8:16 PM  Result Value Ref Range   Glucose-Capillary 167 (H) 70 - 99 mg/dL    Comment: Glucose reference range applies only to samples taken after fasting for at least 8 hours.  Glucose, capillary     Status: Abnormal   Collection Time: 11/07/23  7:22 AM  Result Value Ref Range   Glucose-Capillary 195 (H) 70 - 99 mg/dL    Comment: Glucose reference range applies only to samples taken after fasting for at least 8 hours.  Glucose, capillary     Status: Abnormal   Collection Time: 11/07/23 11:29 AM  Result Value Ref Range   Glucose-Capillary 262 (H) 70 - 99 mg/dL    Comment: Glucose reference range applies only to samples taken after fasting for at least 8 hours.    Blood Alcohol level:  Lab Results  Component Value Date   [facility] <15 11/01/2023   ETH <5 10/02/2014    Metabolic Disorder Labs: Lab Results  Component Value Date   HGBA1C 6.6 (H) 11/01/2023   MPG 143 11/01/2023   MPG 194.38 04/28/2021   No results found for: PROLACTIN Lab Results  Component Value Date   CHOL 143 04/28/2021   TRIG 71 04/28/2021   HDL 62 04/28/2021   CHOLHDL 2.3 04/28/2021   VLDL 14 04/28/2021   LDLCALC 67 04/28/2021   LDLCALC 73 07/09/2016    Physical Findings: AIMS:  , ,  ,  ,    CIWA:    COWS:      Psychiatric Specialty Exam:  Presentation  General Appearance:  Appropriate for Environment;  Casual  Eye Contact: Fair  Speech: Clear and Coherent  Speech Volume: Normal    Mood and Affect  Mood: Fine  Affect: Stable   Thought Process  Thought Processes: Coherent  Descriptions of Associations:Intact  Orientation:Full (Time, Place and Person)  Thought Content:Logical  Hallucinations: Denied  Ideas of Reference:None  Suicidal Thoughts: Denies Homicidal Thoughts: Denies   Sensorium  Memory: Immediate Fair; Recent Fair; Remote Fair  Judgment: Impaired  Insight: Shallow   Executive Functions  Concentration: Fair  Attention Span: Fair  Recall: [employer] of Knowledge: Fair  Language: Fair   Psychomotor Activity  Psychomotor Activity: No data recorded  Musculoskeletal: Strength & Muscle Tone: within normal limits Gait & Station: normal Assets  Assets: Manufacturing systems engineer; Financial Resources/Insurance; Resilience    Physical Exam: Physical Exam Vitals and nursing note reviewed.    Review of Systems  Constitutional: Negative.   Eyes: Negative.    Blood pressure 112/69, pulse 99, temperature (!) 97.2 F (36.2 C), resp. rate 17, height 5' 2 (1.575 m), weight 68 kg, SpO2 98%. Body mass index is 27.42 kg/m.  Diagnosis: Principal Problem:   MDD (major depressive disorder), recurrent severe, without psychosis (HCC)   reatment Plan Summary:   Safety and Monitoring:             -- Voluntary admission to inpatient psychiatric unit for safety, stabilization and treatment             -- Daily contact with patient to assess and evaluate symptoms and progress in treatment             --  Patient's case to be discussed in multi-disciplinary team meeting             -- Observation Level: q15 minute checks             -- Vital signs:  q12 hours             -- Precautions: suicide, elopement, and assault   2. Psychiatric Diagnoses and Treatment:                zoloft   50 mg daily Schedule trazodone  50 mg nightly at 8 PM  and reviewed hydroxyzine  as needed order as patient was receiving both medications at the same time causing daytime sedation   -- The risks/benefits/side-effects/alternatives to this medication were discussed in detail with the patient and time was given for questions. The patient consents to medication trial.                -- Metabolic profile and EKG monitoring obtained while on an atypical antipsychotic (BMI: Lipid Panel: HbgA1c: QTc:)              -- Encouraged patient to participate in unit milieu and in scheduled group therapies                             4. Discharge Planning:   -- Social work and case management to assist with discharge planning and identification of hospital follow-up needs prior to discharge  -- Estimated LOS: 3-4 days  Rishon Thilges, MD 11/07/2023, 3:15 PM

## 2023-11-07 NOTE — Progress Notes (Signed)
   11/07/23 2300  Psych Admission Type (Psych Patients Only)  Admission Status Voluntary  Psychosocial Assessment  Patient Complaints None  Eye Contact Fair  Facial Expression Flat  Affect Appropriate to circumstance  Speech Logical/coherent  Interaction Assertive  Motor Activity Slow  Appearance/Hygiene In scrubs  Behavior Characteristics Calm  Mood Depressed  Thought Process  Coherency WDL  Content WDL  Delusions None reported or observed  Perception WDL  Hallucination None reported or observed  Judgment Impaired  Confusion None  Danger to Self  Current suicidal ideation? Denies

## 2023-11-07 NOTE — Progress Notes (Signed)
   11/07/23 1100  Psych Admission Type (Psych Patients Only)  Admission Status Voluntary  Psychosocial Assessment  Patient Complaints Depression  Eye Contact Fair  Facial Expression Flat  Affect Appropriate to circumstance  Speech Logical/coherent  Interaction Assertive  Motor Activity Slow  Appearance/Hygiene In scrubs  Behavior Characteristics Calm  Mood Depressed  Thought Process  Coherency WDL  Content WDL  Delusions None reported or observed  Perception WDL  Hallucination None reported or observed  Judgment Impaired  Confusion None  Danger to Self  Current suicidal ideation? Denies

## 2023-11-07 NOTE — Plan of Care (Signed)
  Problem: Education: Goal: Utilization of techniques to improve thought processes will improve Outcome: Progressing Goal: Knowledge of the prescribed therapeutic regimen will improve Outcome: Progressing   Problem: Activity: Goal: Interest or engagement in leisure activities will improve Outcome: Progressing Goal: Imbalance in normal sleep/wake cycle will improve Outcome: Progressing   Problem: Coping: Goal: Coping ability will improve Outcome: Progressing Goal: Will verbalize feelings Outcome: Progressing   Problem: Health Behavior/Discharge Planning: Goal: Ability to make decisions will improve Outcome: Progressing Goal: Compliance with therapeutic regimen will improve Outcome: Progressing   

## 2023-11-08 DIAGNOSIS — F332 Major depressive disorder, recurrent severe without psychotic features: Secondary | ICD-10-CM | POA: Diagnosis not present

## 2023-11-08 LAB — GLUCOSE, CAPILLARY
Glucose-Capillary: 205 mg/dL — ABNORMAL HIGH (ref 70–99)
Glucose-Capillary: 250 mg/dL — ABNORMAL HIGH (ref 70–99)
Glucose-Capillary: 230 mg/dL — ABNORMAL HIGH (ref 70–99)
Glucose-Capillary: 218 mg/dL — ABNORMAL HIGH (ref 70–99)

## 2023-11-08 NOTE — BH IP Treatment Plan (Signed)
 Interdisciplinary Treatment and Diagnostic Plan Update  11/08/2023 Time of Session: 3:30 PM Kelly Doyle MRN: [id number]  Principal Diagnosis: MDD (major depressive disorder), recurrent severe, without psychosis (HCC)  Secondary Diagnoses: Principal Problem:   MDD (major depressive disorder), recurrent severe, without psychosis (HCC)   Current Medications:  Current Facility-Administered Medications  Medication Dose Route Frequency Provider Last Rate Last Admin   acetaminophen  (TYLENOL ) tablet 650 mg  650 mg Oral Q6H PRN Jadapalle, Sree, MD       alum & mag hydroxide-simeth (MAALOX/MYLANTA) 200-200-20 MG/5ML suspension 30 mL  30 mL Oral Q4H PRN Jadapalle, Sree, MD   30 mL at 11/07/23 1522   amLODipine  (NORVASC ) tablet 10 mg  10 mg Oral Daily Jadapalle, Sree, MD   10 mg at 11/08/23 0915   atorvastatin  (LIPITOR) tablet 40 mg  40 mg Oral Daily Jadapalle, Sree, MD   40 mg at 11/08/23 0915   empagliflozin  (JARDIANCE ) tablet 25 mg  25 mg Oral Daily Jadapalle, Sree, MD   25 mg at 11/08/23 9082   ferrous sulfate  tablet 325 mg  325 mg Oral Q breakfast Jadapalle, Sree, MD   325 mg at 11/08/23 9082   insulin  aspart (novoLOG ) injection 0-5 Units  0-5 Units Subcutaneous QHS Jadapalle, Sree, MD   4 Units at 11/05/23 2132   insulin  aspart (novoLOG ) injection 0-9 Units  0-9 Units Subcutaneous TID WC Jadapalle, Sree, MD   3 Units at 11/08/23 1737   lisinopril  (ZESTRIL ) tablet 40 mg  40 mg Oral Daily Jadapalle, Sree, MD   40 mg at 11/07/23 9094   magnesium  hydroxide (MILK OF MAGNESIA) suspension 30 mL  30 mL Oral Daily PRN Jadapalle, Sree, MD       OLANZapine  (ZYPREXA ) injection 5 mg  5 mg Intramuscular TID PRN Jadapalle, Sree, MD       OLANZapine  zydis (ZYPREXA ) disintegrating tablet 5 mg  5 mg Oral TID PRN Jadapalle, Sree, MD       pantoprazole  (PROTONIX ) EC tablet 40 mg  40 mg Oral Daily Jadapalle, Sree, MD   40 mg at 11/08/23 0919   sertraline  (ZOLOFT ) tablet 50 mg  50 mg Oral Daily Jadapalle, Sree,  MD   50 mg at 11/08/23 9082   traZODone  (DESYREL ) tablet 50 mg  50 mg Oral Daily Jadapalle, Sree, MD   50 mg at 11/07/23 2122   PTA Medications: Medications Prior to Admission  Medication Sig Dispense Refill Last Dose/Taking   acetaminophen  (TYLENOL ) 650 MG CR tablet SMARTSIG:1-2 Tablet(s) By Mouth Every 8 Hours PRN (Patient not taking: Reported on 11/02/2023)      amLODipine  (NORVASC ) 10 MG tablet Take 1 tablet (10 mg total) by mouth daily. 30 tablet 2    atorvastatin  (LIPITOR) 40 MG tablet Take 1 tablet (40 mg total) by mouth daily. 90 tablet 1    BD PEN NEEDLE NANO 2ND GEN 32G X 4 MM MISC 3 (three) times daily. :3 Times Daily      blood glucose meter kit and supplies KIT Dispense based on patient and insurance preference. Use up to four times daily as directed. 1 each 0    blood glucose meter kit and supplies Dispense based on patient and insurance preference. Use up to four times daily as directed. (FOR ICD-10 E10.9, E11.9). 1 each 0    cetirizine  (ZYRTEC  ALLERGY) 10 MG tablet Take 1 tablet (10 mg total) by mouth daily. 20 tablet 0    ferrous sulfate  325 (65 FE) MG tablet Take 1 tablet (325 mg  total) by mouth daily with breakfast. 30 tablet 1    FLUoxetine (PROZAC) 40 MG capsule Take 40 mg by mouth daily. (Patient not taking: Reported on 11/02/2023)      hydrOXYzine  (ATARAX ) 25 MG tablet Take 25 mg by mouth daily. (Patient not taking: Reported on 11/02/2023)      insulin  detemir (LEVEMIR ) 100 UNIT/ML injection Inject 0.3 mLs (30 Units total) into the skin daily. Each day that the morning blood sugar is greater than 200, add one unit to the evening insulin  detemir  dose 10 mL 2    JARDIANCE  25 MG TABS tablet Take 25 mg by mouth daily.      lisinopril  (ZESTRIL ) 40 MG tablet Take 1 tablet (40 mg total) by mouth daily. 30 tablet 2    omeprazole (PRILOSEC) 20 MG capsule Take 20 mg by mouth 2 (two) times daily before a meal.  (Patient not taking: Reported on 11/02/2023)      venlafaxine XR (EFFEXOR-XR)  150 MG 24 hr capsule Take 150 mg by mouth daily.      VICTOZA 18 MG/3ML SOPN Inject into the skin. (Patient not taking: Reported on 11/02/2023)       Patient Stressors:    Patient Strengths:    Treatment Modalities: Medication Management, Group therapy, Case management,  1 to 1 session with clinician, Psychoeducation, Recreational therapy.   Physician Treatment Plan for Primary Diagnosis: MDD (major depressive disorder), recurrent severe, without psychosis (HCC) Long Term Goal(s): Improvement in symptoms so as ready for discharge   Short Term Goals: Ability to identify changes in lifestyle to reduce recurrence of condition will improve Ability to verbalize feelings will improve Ability to disclose and discuss suicidal ideas Ability to demonstrate self-control will improve Ability to identify and develop effective coping behaviors will improve Ability to maintain clinical measurements within normal limits will improve  Medication Management: Evaluate patient's response, side effects, and tolerance of medication regimen.  Therapeutic Interventions: 1 to 1 sessions, Unit Group sessions and Medication administration.  Evaluation of Outcomes: Adequate for Discharge  Physician Treatment Plan for Secondary Diagnosis: Principal Problem:   MDD (major depressive disorder), recurrent severe, without psychosis (HCC)  Long Term Goal(s): Improvement in symptoms so as ready for discharge   Short Term Goals: Ability to identify changes in lifestyle to reduce recurrence of condition will improve Ability to verbalize feelings will improve Ability to disclose and discuss suicidal ideas Ability to demonstrate self-control will improve Ability to identify and develop effective coping behaviors will improve Ability to maintain clinical measurements within normal limits will improve     Medication Management: Evaluate patient's response, side effects, and tolerance of medication  regimen.  Therapeutic Interventions: 1 to 1 sessions, Unit Group sessions and Medication administration.  Evaluation of Outcomes: Adequate for Discharge   RN Treatment Plan for Primary Diagnosis: MDD (major depressive disorder), recurrent severe, without psychosis (HCC) Long Term Goal(s): Knowledge of disease and therapeutic regimen to maintain health will improve  Short Term Goals: Ability to remain free from injury will improve, Ability to verbalize frustration and anger appropriately will improve, Ability to demonstrate self-control, Ability to participate in decision making will improve, Ability to verbalize feelings will improve, Ability to disclose and discuss suicidal ideas, Ability to identify and develop effective coping behaviors will improve, and Compliance with prescribed medications will improve  Medication Management: RN will administer medications as ordered by provider, will assess and evaluate patient's response and provide education to patient for prescribed medication. RN will report any adverse  and/or side effects to prescribing provider.  Therapeutic Interventions: 1 on 1 counseling sessions, Psychoeducation, Medication administration, Evaluate responses to treatment, Monitor vital signs and CBGs as ordered, Perform/monitor CIWA, COWS, AIMS and Fall Risk screenings as ordered, Perform wound care treatments as ordered.  Evaluation of Outcomes: Adequate for Discharge   LCSW Treatment Plan for Primary Diagnosis: MDD (major depressive disorder), recurrent severe, without psychosis (HCC) Long Term Goal(s): Safe transition to appropriate next level of care at discharge, Engage patient in therapeutic group addressing interpersonal concerns.  Short Term Goals: Engage patient in aftercare planning with referrals and resources  Therapeutic Interventions: Assess for all discharge needs, 1 to 1 time with Social worker, Explore available resources and support systems, Assess for  adequacy in community support network, Educate family and significant other(s) on suicide prevention, Complete Psychosocial Assessment, Interpersonal group therapy.  Evaluation of Outcomes: Adequate for Discharge  Progress in Treatment: Attending groups: Yes. and No. Participating in groups: Yes. and No. Taking medication as prescribed: Yes. Toleration medication: Yes. Family/Significant other contact made: No, will contact:  CSW will contact if given permission  Patient understands diagnosis: Yes. Discussing patient identified problems/goals with staff: Yes. Medical problems stabilized or resolved: Yes. Denies suicidal/homicidal ideation: Yes. Issues/concerns per patient self-inventory: No. Other: None    New problem(s) identified: No, Describe:  None identified Update 11/08/23: No changes at this time   New Short Term/Long Term Goal(s): elimination of symptoms of psychosis, medication management for mood stabilization; elimination of SI thoughts; development of comprehensive mental wellness plan. Update 11/08/23: No changes at this time   Patient Goals:  My goal is to find me somewhere to stay Update 11/08/23: No changes at this time   Discharge Plan or Barriers: CSW will assist with appropriate discharge planning Update 11/08/23: No changes at this time   Reason for Continuation of Hospitalization: Depression Medication stabilization   Estimated Length of Stay: 1 to 7 days Update 11/08/23: TBD  Last 3 [place] Suicide Severity Risk Score: Flowsheet Row Admission (Current) from 11/02/2023 in [facility] ED from 11/01/2023 in [facility] Emergency Department at [facility] ED from 03/17/2022 in [place] [facility] Emergency Department at [facility]  C-SSRS RISK CATEGORY Error: Question 6 not populated High Risk No Risk    Last PHQ 2/9 Scores:     No data to display          Scribe for Treatment Team: Lum [name], [place]RA[place] 11/08/2023 8:23  PM

## 2023-11-08 NOTE — Group Note (Unsigned)
 Date:  11/08/2023 Time:  6:33 PM  Group Topic/Focus:  Healthy Communication:   The focus of this group is to discuss communication, barriers to communication, as well as healthy ways to communicate with others.     Participation Level:  {BHH PARTICIPATION OZCZO:77735}  Participation Quality:  {BHH PARTICIPATION QUALITY:22265}  Affect:  {BHH AFFECT:22266}  Cognitive:  {BHH COGNITIVE:22267}  Insight: {BHH Insight2:20797}  Engagement in Group:  {BHH ENGAGEMENT IN HMNLE:77731}  Modes of Intervention:  {BHH MODES OF INTERVENTION:22269}  Additional Comments:  ***  Kelly Doyle l Kelly Doyle 11/08/2023, 6:33 PM

## 2023-11-08 NOTE — Progress Notes (Signed)
 [facility] MD Progress Note  11/08/2023 3:23 PM Kelly Doyle  MRN:  [id number]   Kelly Doyle is a 68 y/o F who is A&O x3 with PMH significant for MDD, DM2 requiring insulin , HTN, and CKD (stage 3). She initially presented to the [facility] ED on on 11/01/23 for suicidal ideation and threatening bodily harm to herself with a knife. Her daughter stopped her and brought the pt to the ED for evaluation Patient is admitted to [facility] psych unit with Q15 min safety monitoring. Multidisciplinary team approach is offered. Medication management; group/milieu therapy is offered.   Subjective:  Chart reviewed, case discussed in multidisciplinary meeting, patient is noted to be more awake today.  Patient is noted to be using a book in her room today.  She reports that tomorrow she has made arrangements to go stay with some of her family members.  She reports taking Zoloft  and having no side effects.  She reports that her mood has improved and her anxiety is under control.  She denies SI/HI/plan.  She denies auditory/visual hallucinations.  She [place] and fair sleep with trazodone .   Sleep: Fair  Appetite:  Fair  Past Psychiatric History: see h&P Family History:  Family History  Problem Relation Age of Onset   Alzheimer's disease Mother    Hypertension Mother    Diabetes Sister    Gout Brother    Social History:  Social History   Substance and Sexual Activity  Alcohol Use No     Social History   Substance and Sexual Activity  Drug Use No    Social History   Socioeconomic History   Marital status: Single    Spouse name: Not on file   Number of children: Not on file   Years of education: Not on file   Highest education level: Not on file  Occupational History   Not on file  Tobacco Use   Smoking status: Never   Smokeless tobacco: Never  Vaping Use   Vaping status: Never Used  Substance and Sexual Activity   Alcohol use: No   Drug use: No   Sexual activity: Yes    Birth control/protection: None   Other Topics Concern   Not on file  Social History Narrative   Not on file   Social Drivers of Health   Financial Resource Strain: Not on file  Food Insecurity: No Food Insecurity (11/02/2023)   Hunger Vital Sign    Worried About Running Out of Food in the Last Year: Never true    Ran Out of Food in the Last Year: Never true  Transportation Needs: No Transportation Needs (11/02/2023)   PRAPARE - Administrator, Civil Service (Medical): No    Lack of Transportation (Non-Medical): No  Physical Activity: Not on file  Stress: Not on file  Social Connections: Moderately Isolated (11/02/2023)   Social Connection and Isolation Panel    Frequency of Communication with Friends and Family: Three times a week    Frequency of Social Gatherings with Friends and Family: More than three times a week    Attends Religious Services: 1 to 4 times per year    Active Member of [employer] or Organizations: No    Attends Banker Meetings: Never    Marital Status: Divorced   Past Medical History:  Past Medical History:  Diagnosis Date   Anxiety    Arthritis    Diabetes mellitus without complication (HCC)    GERD (gastroesophageal reflux disease)  Hypertension     Past Surgical History:  Procedure Laterality Date   COLONOSCOPY WITH PROPOFOL  N/A 06/23/2017   Procedure: COLONOSCOPY WITH PROPOFOL ;  Surgeon: Toledo, Ladell POUR, MD;  Location: [facility] ENDOSCOPY;  Service: Gastroenterology;  Laterality: N/A;   DILATION AND CURETTAGE OF UTERUS  04/20/1973   NO PAST SURGERIES     TUBAL LIGATION      Current Medications: Current Facility-Administered Medications  Medication Dose Route Frequency Provider Last Rate Last Admin   acetaminophen  (TYLENOL ) tablet 650 mg  650 mg Oral Q6H PRN Adir Schicker, MD       alum & mag hydroxide-simeth (MAALOX/MYLANTA) 200-200-20 MG/5ML suspension 30 mL  30 mL Oral Q4H PRN Drew Herman, MD   30 mL at 11/07/23 1522   amLODipine  (NORVASC ) tablet 10 mg   10 mg Oral Daily Durga Saldarriaga, MD   10 mg at 11/08/23 0915   atorvastatin  (LIPITOR) tablet 40 mg  40 mg Oral Daily Yosgar Demirjian, MD   40 mg at 11/08/23 0915   empagliflozin  (JARDIANCE ) tablet 25 mg  25 mg Oral Daily Alistair Senft, MD   25 mg at 11/08/23 9082   ferrous sulfate  tablet 325 mg  325 mg Oral Q breakfast Donnelly Mellow, MD   325 mg at 11/08/23 9082   insulin  aspart (novoLOG ) injection 0-5 Units  0-5 Units Subcutaneous QHS Emmelina Mcloughlin, MD   4 Units at 11/05/23 2132   insulin  aspart (novoLOG ) injection 0-9 Units  0-9 Units Subcutaneous TID WC Cloyde Oregel, MD   3 Units at 11/08/23 1152   lisinopril  (ZESTRIL ) tablet 40 mg  40 mg Oral Daily Deane Melick, MD   40 mg at 11/07/23 9094   magnesium  hydroxide (MILK OF MAGNESIA) suspension 30 mL  30 mL Oral Daily PRN Donnelly Mellow, MD       OLANZapine  (ZYPREXA ) injection 5 mg  5 mg Intramuscular TID PRN Zynasia Burklow, MD       OLANZapine  zydis (ZYPREXA ) disintegrating tablet 5 mg  5 mg Oral TID PRN Kees Idrovo, MD       pantoprazole  (PROTONIX ) EC tablet 40 mg  40 mg Oral Daily Christophor Eick, MD   40 mg at 11/08/23 0919   sertraline  (ZOLOFT ) tablet 50 mg  50 mg Oral Daily Joanna Borawski, MD   50 mg at 11/08/23 0917   traZODone  (DESYREL ) tablet 50 mg  50 mg Oral Daily Kenyata Napier, MD   50 mg at 11/07/23 2122    Lab Results:  Results for orders placed or performed during the hospital encounter of 11/02/23 (from the past 48 hours)  Glucose, capillary     Status: Abnormal   Collection Time: 11/06/23  4:17 PM  Result Value Ref Range   Glucose-Capillary 238 (H) 70 - 99 mg/dL    Comment: Glucose reference range applies only to samples taken after fasting for at least 8 hours.  Glucose, capillary     Status: Abnormal   Collection Time: 11/06/23  8:16 PM  Result Value Ref Range   Glucose-Capillary 167 (H) 70 - 99 mg/dL    Comment: Glucose reference range applies only to samples taken after fasting for at least 8  hours.  Glucose, capillary     Status: Abnormal   Collection Time: 11/07/23  7:22 AM  Result Value Ref Range   Glucose-Capillary 195 (H) 70 - 99 mg/dL    Comment: Glucose reference range applies only to samples taken after fasting for at least 8 hours.  Glucose, capillary  Status: Abnormal   Collection Time: 11/07/23 11:29 AM  Result Value Ref Range   Glucose-Capillary 262 (H) 70 - 99 mg/dL    Comment: Glucose reference range applies only to samples taken after fasting for at least 8 hours.  Glucose, capillary     Status: Abnormal   Collection Time: 11/07/23  4:26 PM  Result Value Ref Range   Glucose-Capillary 159 (H) 70 - 99 mg/dL    Comment: Glucose reference range applies only to samples taken after fasting for at least 8 hours.  Glucose, capillary     Status: Abnormal   Collection Time: 11/07/23  8:10 PM  Result Value Ref Range   Glucose-Capillary 167 (H) 70 - 99 mg/dL    Comment: Glucose reference range applies only to samples taken after fasting for at least 8 hours.  Glucose, capillary     Status: Abnormal   Collection Time: 11/08/23  7:30 AM  Result Value Ref Range   Glucose-Capillary 205 (H) 70 - 99 mg/dL    Comment: Glucose reference range applies only to samples taken after fasting for at least 8 hours.  Glucose, capillary     Status: Abnormal   Collection Time: 11/08/23 11:47 AM  Result Value Ref Range   Glucose-Capillary 250 (H) 70 - 99 mg/dL    Comment: Glucose reference range applies only to samples taken after fasting for at least 8 hours.    Blood Alcohol level:  Lab Results  Component Value Date   [facility] <15 11/01/2023   ETH <5 10/02/2014    Metabolic Disorder Labs: Lab Results  Component Value Date   HGBA1C 6.6 (H) 11/01/2023   MPG 143 11/01/2023   MPG 194.38 04/28/2021   No results found for: PROLACTIN Lab Results  Component Value Date   CHOL 143 04/28/2021   TRIG 71 04/28/2021   HDL 62 04/28/2021   CHOLHDL 2.3 04/28/2021   VLDL 14  04/28/2021   LDLCALC 67 04/28/2021   LDLCALC 73 07/09/2016    Physical Findings: AIMS:  , ,  ,  ,    CIWA:    COWS:      Psychiatric Specialty Exam:  Presentation  General Appearance:  Appropriate for Environment; Casual  Eye Contact: Fair  Speech: Clear and Coherent  Speech Volume: Normal    Mood and Affect  Mood: Fine  Affect: Stable   Thought Process  Thought Processes: Coherent  Descriptions of Associations:Intact  Orientation:Full (Time, Place and Person)  Thought Content:Logical  Hallucinations: Denied  Ideas of Reference:None  Suicidal Thoughts: Denies Homicidal Thoughts: Denies   Sensorium  Memory: Immediate Fair; Recent Fair; Remote Fair  Judgment: Impaired  Insight: Shallow   Executive Functions  Concentration: Fair  Attention Span: Fair  Recall: [employer] of Knowledge: Fair  Language: Fair   Psychomotor Activity  Psychomotor Activity: No data recorded  Musculoskeletal: Strength & Muscle Tone: within normal limits Gait & Station: normal Assets  Assets: Manufacturing systems engineer; Financial Resources/Insurance; Resilience    Physical Exam: Physical Exam Vitals and nursing note reviewed.    Review of Systems  Constitutional: Negative.   Eyes: Negative.    Blood pressure 117/76, pulse (!) 107, temperature 98 F (36.7 C), resp. rate 17, height 5' 2 (1.575 m), weight 68 kg, SpO2 99%. Body mass index is 27.42 kg/m.  Diagnosis: Principal Problem:   MDD (major depressive disorder), recurrent severe, without psychosis (HCC)   reatment Plan Summary:   Safety and Monitoring:             --  Voluntary admission to inpatient psychiatric unit for safety, stabilization and treatment             -- Daily contact with patient to assess and evaluate symptoms and progress in treatment             -- Patient's case to be discussed in multi-disciplinary team meeting             -- Observation Level: q15 minute  checks             -- Vital signs:  q12 hours             -- Precautions: suicide, elopement, and assault   2. Psychiatric Diagnoses and Treatment:                zoloft   50 mg daily Schedule trazodone  50 mg nightly at 8 PM and reviewed hydroxyzine  as needed order as patient was receiving both medications at the same time causing daytime sedation   -- The risks/benefits/side-effects/alternatives to this medication were discussed in detail with the patient and time was given for questions. The patient consents to medication trial.                -- Metabolic profile and EKG monitoring obtained while on an atypical antipsychotic (BMI: Lipid Panel: HbgA1c: QTc:)              -- Encouraged patient to participate in unit milieu and in scheduled group therapies                             4. Discharge Planning:   -- Social work and case management to assist with discharge planning and identification of hospital follow-up needs prior to discharge  -- Estimated LOS: 3-4 days  Allyn Foil, MD 11/08/2023, 3:23 PM

## 2023-11-08 NOTE — Group Note (Signed)
 Recreation Therapy Group Note   Group Topic:Goal Setting  Group Date: 11/08/2023 Start Time: 1500 End Time: 1600 Facilitators: Celestia Jeoffrey BRAVO, LRT, CTRS Location: Dayroom  Group Description: Product/process development scientist. Patients were given many different magazines, a glue stick, markers, and a piece of cardstock paper. LRT and pts discussed the importance of having goals in life. LRT and pts discussed the difference between short-term and long-term goals, as well as what a SMART goal is. LRT encouraged pts to create a vision board, with images they picked and then cut out with safety scissors from the magazine, for themselves, that capture their short and long-term goals. LRT encouraged pts to show and explain their vision board to the group.   Goal Area(s) Addressed:  Patient will gain knowledge of short vs. long term goals.  Patient will identify goals for themselves. Patient will practice setting SMART goals.   Affect/Mood: Appropriate and Irritable   Participation Level: Active   Participation Quality: Independent   Behavior: Alert   Speech/Thought Process: Coherent   Insight: Good   Judgement: Good   Modes of Intervention: Art   Patient Response to Interventions:  Receptive   Education Outcome:  Acknowledges education   Clinical Observations/Individualized Feedback: Kelly Doyle was active in their participation of session activities and group discussion. Pt identified I want to cook more as her goal. Pt appropriately identified images to reflect this goal.    Plan: Continue to engage patient in RT group sessions 2-3x/week.   Jeoffrey BRAVO Celestia, LRT, CTRS 11/08/2023 5:56 PM

## 2023-11-08 NOTE — Group Note (Signed)
 Date:  11/08/2023 Time:  9:16 PM  Group Topic/Focus:  Developing a Wellness Toolbox:   The focus of this group is to help patients develop a wellness toolbox with skills and strategies to promote recovery upon discharge. Dimensions of Wellness:   The focus of this group is to introduce the topic of wellness and discuss the role each dimension of wellness plays in total health. Wellness Toolbox:   The focus of this group is to discuss various aspects of wellness, balancing those aspects and exploring ways to increase the ability to experience wellness.  Patients will create a wellness toolbox for use upon discharge.    Participation Level:  Active  Participation Quality:  Appropriate and Supportive  Affect:  Appropriate  Cognitive:  Appropriate  Insight: Appropriate and Good  Engagement in Group:  Engaged and Supportive  Modes of Intervention:  Socialization and Support  Additional Comments:  Pt did attend group. Pt was engaged and supportive throughout the duration of group. Will continue to motivate pt to keep attending group sessions with peers.  Brad GORMAN Ryder 11/08/2023, 9:16 PM

## 2023-11-08 NOTE — Inpatient Diabetes Management (Signed)
 Inpatient Diabetes Program Recommendations  AACE/ADA: New Consensus Statement on Inpatient Glycemic Control (2015)  Target Ranges:  Prepandial:   less than 140 mg/dL      Peak postprandial:   less than 180 mg/dL (1-2 hours)      Critically ill patients:  140 - 180 mg/dL   Lab Results  Component Value Date   GLUCAP 250 (H) 11/08/2023   HGBA1C 6.6 (H) 11/01/2023    Review of Glycemic Control  Latest Reference Range & Units 11/07/23 16:26 11/07/23 20:10 11/08/23 07:30 11/08/23 11:47  Glucose-Capillary 70 - 99 mg/dL 840 (H) 832 (H) 794 (H) 250 (H)   Diabetes history: DM 2 Outpatient Diabetes medications:  Levemir  30 units daily Jardiance  25 mg daily Current orders for Inpatient glycemic control:  Novolog  0-9 units tid with meals and HS Jardiance  25 mg daily  Inpatient Diabetes Program Recommendations:    Please consider adding Semglee 10 units daily.   Thanks,  Randall Bullocks, RN, BC-ADM Inpatient Diabetes Coordinator Pager [phone]  (8a-5p)

## 2023-11-08 NOTE — Group Note (Signed)
 Date:  11/08/2023 Time:  11:40 AM  Group Topic/Focus:  Making Healthy Choices:   The focus of this group is to help patients identify negative/unhealthy choices they were using prior to admission and identify positive/healthier coping strategies to replace them upon discharge.    Participation Level:  Active  Participation Quality:  Appropriate  Affect:  Appropriate  Cognitive:  Appropriate  Insight: Appropriate  Engagement in Group:  Engaged  Modes of Intervention:  Discussion   Kelly Doyle 11/08/2023, 11:40 AM

## 2023-11-08 NOTE — Group Note (Signed)
 Date:  11/08/2023 Time:  6:39 PM  Group Topic/Focus:  Healthy Communication:   The focus of this group is to discuss communication, barriers to communication, as well as healthy ways to communicate with others.    Participation Level:  Active  Participation Quality:  Appropriate  Affect:  Appropriate  Cognitive:  Appropriate  Insight: Good  Engagement in Group:  Engaged  Modes of Intervention:  Activity and Discussion  [place]anyon Willow l Malene Blaydes 11/08/2023, 6:39 PM

## 2023-11-09 ENCOUNTER — Other Ambulatory Visit: Payer: Self-pay

## 2023-11-09 DIAGNOSIS — F332 Major depressive disorder, recurrent severe without psychotic features: Secondary | ICD-10-CM | POA: Diagnosis not present

## 2023-11-09 LAB — GLUCOSE, CAPILLARY
Glucose-Capillary: 238 mg/dL — ABNORMAL HIGH (ref 70–99)
Glucose-Capillary: 266 mg/dL — ABNORMAL HIGH (ref 70–99)

## 2023-11-09 MED ORDER — PANTOPRAZOLE SODIUM 40 MG PO TBEC
40.0000 mg | DELAYED_RELEASE_TABLET | Freq: Every day | ORAL | 0 refills | Status: DC
  Filled 2023-11-09: qty 30, 30d supply, fill #0

## 2023-11-09 MED ORDER — INSUPEN PEN NEEDLES 31G X 5 MM MISC
1.0000 | Freq: Four times a day (QID) | 0 refills | Status: DC
  Filled 2023-11-09: qty 100, 25d supply, fill #0

## 2023-11-09 MED ORDER — EMPAGLIFLOZIN 25 MG PO TABS
25.0000 mg | ORAL_TABLET | Freq: Every day | ORAL | 0 refills | Status: DC
  Filled 2023-11-09: qty 30, 30d supply, fill #0

## 2023-11-09 MED ORDER — INSULIN ASPART 100 UNIT/ML IJ SOLN
0.0000 [IU] | Freq: Three times a day (TID) | INTRAMUSCULAR | 11 refills | Status: DC
  Filled 2023-11-09: qty 10, 37d supply, fill #0

## 2023-11-09 MED ORDER — SERTRALINE HCL 50 MG PO TABS
50.0000 mg | ORAL_TABLET | Freq: Every day | ORAL | 0 refills | Status: DC
  Filled 2023-11-09: qty 30, 30d supply, fill #0

## 2023-11-09 MED ORDER — TRAZODONE HCL 50 MG PO TABS
50.0000 mg | ORAL_TABLET | Freq: Every day | ORAL | 0 refills | Status: DC
  Filled 2023-11-09: qty 30, 30d supply, fill #0

## 2023-11-09 MED ORDER — INSULIN LISPRO (1 UNIT DIAL) 100 UNIT/ML (KWIKPEN)
0.0000 [IU] | PEN_INJECTOR | Freq: Three times a day (TID) | SUBCUTANEOUS | 11 refills | Status: DC
  Filled 2023-11-09: qty 15, 42d supply, fill #0

## 2023-11-09 NOTE — Care Management Important Message (Signed)
 Important Message  Patient Details  Name: Kelly Doyle MRN: [id number] Date of Birth: 05/29/1955   Important Message Given:  Yes - Medicare IM     Lum [name], LCSWA 11/09/2023, 11:34 AM

## 2023-11-09 NOTE — BHH Suicide Risk Assessment (Signed)
 [facility] Discharge Suicide Risk Assessment   Principal Problem: MDD (major depressive disorder), recurrent severe, without psychosis (HCC) Discharge Diagnoses: Principal Problem:   MDD (major depressive disorder), recurrent severe, without psychosis (HCC)   Total Time spent with patient: 30 minutes  Musculoskeletal: Strength & Muscle Tone: within normal limits Gait & Station: normal Patient leans: N/A  Psychiatric Specialty Exam  Presentation  General Appearance:  Appropriate for Environment; Casual  Eye Contact: Fair  Speech: Clear and Coherent  Speech Volume: Normal  Handedness: Right   Mood and Affect  Mood: Anxious  Duration of Depression Symptoms: No data recorded Affect: Appropriate   Thought Process  Thought Processes: Coherent  Descriptions of Associations:Intact  Orientation:Full (Time, Place and Person)  Thought Content:Logical  History of Schizophrenia/Schizoaffective disorder:No data recorded Duration of Psychotic Symptoms:No data recorded Hallucinations:Hallucinations: None  Ideas of Reference:None  Suicidal Thoughts:Suicidal Thoughts: No  Homicidal Thoughts:Homicidal Thoughts: No   Sensorium  Memory: Immediate Fair; Recent Fair; Remote Fair  Judgment: Fair  Insight: Fair   Art therapist  Concentration: Fair  Attention Span: Fair  Recall: [employer] of Knowledge: Fair  Language: Fair   Psychomotor Activity  Psychomotor Activity: Psychomotor Activity: Normal   Assets  Assets: Communication Skills; Desire for Improvement; Physical Health; Resilience   Sleep  Sleep: Sleep: Fair  Estimated Sleeping Duration (Last 24 Hours): 7.25-8.75 hours  Physical Exam: Physical Exam ROS Blood pressure (!) 143/98, pulse (!) 110, temperature 97.9 F (36.6 C), resp. rate 18, height 5' 2 (1.575 m), weight 68 kg, SpO2 100%. Body mass index is 27.42 kg/m.  Mental Status Per Nursing Assessment::   On Admission:   NA  Demographic Factors:  Low socioeconomic status  Loss Factors: Decrease in vocational status  Historical Factors: NA  Risk Reduction Factors:   Positive social support, Positive therapeutic relationship, and Positive coping skills or problem solving skills  Continued Clinical Symptoms:  Depression:   Impulsivity  Cognitive Features That Contribute To Risk:  None    Suicide Risk:  Minimal: No identifiable suicidal ideation.  Patients presenting with no risk factors but with morbid ruminations; may be classified as minimal risk based on the severity of the depressive symptoms    Plan Of Care/Follow-up recommendations:  Activity:  As tolerated  Allyn Foil, MD 11/09/2023, 9:14 AM

## 2023-11-09 NOTE — Group Note (Signed)
 Recreation Therapy Group Note   Group Topic:General Recreation  Group Date: 11/09/2023 Start Time: 1100 End Time: 1135 Facilitators: Celestia Jeoffrey BRAVO, LRT, CTRS Location: Courtyard  Group Description: Outdoor Recreation. Patients had the option to play corn hole, ring toss, bowling or listening to music while outside in the courtyard getting fresh air and sunlight. Patients helped water and prune the raised garden beds. LRT and patients discussed things that they enjoy doing in their free time outside of the hospital. LRT encouraged patients to drink water after being active and getting their heart rate up.   Goal Area(s) Addressed: Patient will identify leisure interests.  Patient will practice healthy decision making. Patient will engage in recreation activity.   Affect/Mood: N/A   Participation Level: Did not attend    Clinical Observations/Individualized Feedback: Patient did not attend group.   Plan: Continue to engage patient in RT group sessions 2-3x/week.   Jeoffrey BRAVO Celestia, LRT, CTRS 11/09/2023 2:07 PM

## 2023-11-09 NOTE — Progress Notes (Signed)
  [facility] Adult Case Management Discharge Plan :  Will you be returning to the same living situation after discharge:  Yes,  pt will live with family At discharge, do you have transportation home?: Yes,  pt will be picked up at 1:00 PM  Do you have the ability to pay for your medications: Yes,  [employer] MEDICARE / DREMA DUAL COMPLETE  Release of information consent forms completed and in the chart;  Patient's signature needed at discharge.  Patient to Follow up at:  Follow-up Information     [facility] Follow up.   Why: Kelly Doyle has an appt on 7/29 at 8:30 via the phone and will receive a call at [phone]. Contact information: [address]  [address] [place] [address] [phone]                 Next level of care provider has access to [facility] Link:no  Safety Planning and Suicide Prevention discussed: Yes,  SPE gone over with pt      Has patient been referred to the Quitline?: Patient does not use tobacco/nicotine products  Patient has been referred for addiction treatment: No known substance use disorder.  [address] LCSWA 11/09/2023, 11:28 AM

## 2023-11-09 NOTE — Discharge Summary (Signed)
 Physician Discharge Summary Note  Patient:  Kelly Doyle is an 68 y.o., female MRN:  [id number] DOB:  May 14, 1955 Patient phone:  [phone] (home)  Patient address:   [address] [place] [address],    Date of Admission:  11/02/2023 Date of Discharge: 11/09/23  Reason for Admission:  Kelly Doyle is a 68 y/o F who is A&O x3 with PMH significant for MDD, DM2 requiring insulin , HTN, and CKD (stage 3). She initially presented to the [facility] ED on on 11/01/23 for suicidal ideation and threatening bodily harm to herself with a knife. Her daughter stopped her and brought the pt to the ED for evaluation Patient is admitted to [facility] psych unit with Q15 min safety monitoring. Multidisciplinary team approach is offered. Medication management; group/milieu therapy is offered.   Principal Problem: MDD (major depressive disorder), recurrent severe, without psychosis (HCC) Discharge Diagnoses: Principal Problem:   MDD (major depressive disorder), recurrent severe, without psychosis (HCC)   Past Psychiatric History: see h&p  Family Psychiatric  History: see h&p Social History:  Social History   Substance and Sexual Activity  Alcohol Use No     Social History   Substance and Sexual Activity  Drug Use No    Social History   Socioeconomic History   Marital status: Single    Spouse name: Not on file   Number of children: Not on file   Years of education: Not on file   Highest education level: Not on file  Occupational History   Not on file  Tobacco Use   Smoking status: Never   Smokeless tobacco: Never  Vaping Use   Vaping status: Never Used  Substance and Sexual Activity   Alcohol use: No   Drug use: No   Sexual activity: Yes    Birth control/protection: None  Other Topics Concern   Not on file  Social History Narrative   Not on file   Social Drivers of Health   Financial Resource Strain: Not on file  Food Insecurity: No Food Insecurity (11/02/2023)   Hunger Vital Sign     Worried About Running Out of Food in the Last Year: Never true    Ran Out of Food in the Last Year: Never true  Transportation Needs: No Transportation Needs (11/02/2023)   PRAPARE - Administrator, Civil Service (Medical): No    Lack of Transportation (Non-Medical): No  Physical Activity: Not on file  Stress: Not on file  Social Connections: Moderately Isolated (11/02/2023)   Social Connection and Isolation Panel    Frequency of Communication with Friends and Family: Three times a week    Frequency of Social Gatherings with Friends and Family: More than three times a week    Attends Religious Services: 1 to 4 times per year    Active Member of [employer] or Organizations: No    Attends Banker Meetings: Never    Marital Status: Divorced   Past Medical History:  Past Medical History:  Diagnosis Date   Anxiety    Arthritis    Diabetes mellitus without complication (HCC)    GERD (gastroesophageal reflux disease)    Hypertension     Past Surgical History:  Procedure Laterality Date   COLONOSCOPY WITH PROPOFOL  N/A 06/23/2017   Procedure: COLONOSCOPY WITH PROPOFOL ;  Surgeon: Toledo, Ladell MARLA, MD;  Location: [facility] ENDOSCOPY;  Service: Gastroenterology;  Laterality: N/A;   DILATION AND CURETTAGE OF UTERUS  04/20/1973   NO PAST SURGERIES     TUBAL  LIGATION     Family History:  Family History  Problem Relation Age of Onset   Alzheimer's disease Mother    Hypertension Mother    Diabetes Sister    Gout Brother     Hospital Course:  Kelly Doyle is a 68 y/o F who is A&O x3 with PMH significant for MDD, DM2 requiring insulin , HTN, and CKD (stage 3). She initially presented to the [facility] ED on on 11/01/23 for suicidal ideation and threatening bodily harm to herself with a knife. Her daughter stopped her and brought the pt to the ED for evaluation Patient is admitted to [facility] psych unit with Q15 min safety monitoring. Multidisciplinary team approach is offered. Medication management;  group/milieu therapy is offered.  Detailed risk assessment is complete based on clinical exam and individual risk factors and acute suicide risk is low and acute violence risk is low.   On admission, patient was started on Zoloft  25 mg which was titrated up to 50 mg to help with the depression and mood stability.  Patient tolerated the medications very well.  She displayed safe behaviors on the unit and participated in groups.  She denied SI/HI/plan.  On the day of discharge she consistently denied SI/HI/plan and denied hallucinations.  She remains future oriented and is willing to participate in outpatient mental health services.  She reports that her daughter will be coming to pick her up and then she will be going to her grandson's house on discharge at.  Later in the day she asked for cab voucher to go to her grandson.   Currently, all modifiable risk of harm to self/harm to others have been addressed and patient is no longer appropriate for the acute inpatient setting and is able to continue treatment for mental health needs in the community with the supports as indicated below.  Patient is educated and verbalized understanding of discharge plan of care including medications, follow-up appointments, mental health resources and further crisis services in the community.  Patient is instructed to call 911 or present to the nearest emergency room should she experience any decompensation in mood, disturbance of bowel or return of suicidal/homicidal ideations.  Patient verbalizes understanding of this education and agrees to this plan of care  Physical Findings: AIMS:  , ,  ,  ,    CIWA:    COWS:        Psychiatric Specialty Exam:  Presentation  General Appearance:  Appropriate for Environment; Casual  Eye Contact: Fair  Speech: Clear and Coherent  Speech Volume: Normal    Mood and Affect  Mood: Anxious  Affect: Appropriate   Thought Process  Thought  Processes: Coherent  Descriptions of Associations:Intact  Orientation:Full (Time, Place and Person)  Thought Content:Logical  Hallucinations:Hallucinations: None  Ideas of Reference:None  Suicidal Thoughts:Suicidal Thoughts: No  Homicidal Thoughts:Homicidal Thoughts: No   Sensorium  Memory: Immediate Fair; Recent Fair; Remote Fair  Judgment: Fair  Insight: Fair   Art therapist  Concentration: Fair  Attention Span: Fair  Recall: [employer] of Knowledge: Fair  Language: Fair   Psychomotor Activity  Psychomotor Activity: Psychomotor Activity: Normal  Musculoskeletal: Strength & Muscle Tone: within normal limits Gait & Station: normal Assets  Assets: Manufacturing systems engineer; Desire for Improvement; Physical Health; Resilience   Sleep  Sleep: Sleep: Fair    Physical Exam: Physical Exam Vitals and nursing note reviewed.    ROS Blood pressure (!) 143/98, pulse (!) 110, temperature 97.9 F (36.6 C), resp. rate 18, height 5' 2 (  1.575 m), weight 68 kg, SpO2 100%. Body mass index is 27.42 kg/m.   Social History   Tobacco Use  Smoking Status Never  Smokeless Tobacco Never   Tobacco Cessation:  N/A, patient does not currently use tobacco products   Blood Alcohol level:  Lab Results  Component Value Date   [facility] <15 11/01/2023   ETH <5 10/02/2014    Metabolic Disorder Labs:  Lab Results  Component Value Date   HGBA1C 6.6 (H) 11/01/2023   MPG 143 11/01/2023   MPG 194.38 04/28/2021   No results found for: PROLACTIN Lab Results  Component Value Date   CHOL 143 04/28/2021   TRIG 71 04/28/2021   HDL 62 04/28/2021   CHOLHDL 2.3 04/28/2021   VLDL 14 04/28/2021   LDLCALC 67 04/28/2021   LDLCALC 73 07/09/2016    See Psychiatric Specialty Exam and Suicide Risk Assessment completed by Attending Physician prior to discharge.  Discharge destination:  Home  Is patient on multiple antipsychotic therapies at discharge:  No   Has  Patient had three or more failed trials of antipsychotic monotherapy by history:  No  Recommended Plan for Multiple Antipsychotic Therapies: NA  Discharge Instructions     Diet - low sodium heart healthy   Complete by: As directed    Increase activity slowly   Complete by: As directed       Allergies as of 11/09/2023   No Known Allergies      Medication List     STOP taking these medications    FLUoxetine 40 MG capsule Commonly known as: PROZAC   hydrOXYzine  25 MG tablet Commonly known as: ATARAX    insulin  detemir 100 UNIT/ML injection Commonly known as: LEVEMIR    omeprazole 20 MG capsule Commonly known as: PRILOSEC Replaced by: pantoprazole  40 MG tablet   venlafaxine XR 150 MG 24 hr capsule Commonly known as: EFFEXOR-XR       TAKE these medications      Indication  acetaminophen  650 MG CR tablet Commonly known as: TYLENOL  SMARTSIG:1-2 Tablet(s) By Mouth Every 8 Hours PRN    amLODipine  10 MG tablet Commonly known as: NORVASC  Take 1 tablet (10 mg total) by mouth daily.    atorvastatin  40 MG tablet Commonly known as: LIPITOR Take 1 tablet (40 mg total) by mouth daily.    BD Pen Needle Nano 2nd Gen 32G X 4 MM Misc Generic drug: Insulin  Pen Needle 3 (three) times daily. :3 Times Daily    blood glucose meter kit and supplies Dispense based on patient and insurance preference. Use up to four times daily as directed. (FOR ICD-10 E10.9, E11.9).    blood glucose meter kit and supplies Kit Dispense based on patient and insurance preference. Use up to four times daily as directed.    cetirizine  10 MG tablet Commonly known as: ZyrTEC  Allergy Take 1 tablet (10 mg total) by mouth daily.    empagliflozin  25 MG Tabs tablet Commonly known as: Jardiance  Take 1 tablet (25 mg total) by mouth daily.    ferrous sulfate  325 (65 FE) MG tablet Take 1 tablet (325 mg total) by mouth daily with breakfast.    insulin  aspart 100 UNIT/ML injection Commonly known as:  novoLOG  Inject 0-5 Units into the skin at bedtime.    insulin  aspart 100 UNIT/ML injection Commonly known as: novoLOG  Inject 0-9 Units into the skin 3 (three) times daily with meals.    lisinopril  40 MG tablet Commonly known as: ZESTRIL  Take 1 tablet (40 mg total)  by mouth daily.    pantoprazole  40 MG tablet Commonly known as: PROTONIX  Take 1 tablet (40 mg total) by mouth daily. Replaces: omeprazole 20 MG capsule    sertraline  50 MG tablet Commonly known as: ZOLOFT  Take 1 tablet (50 mg total) by mouth daily.    traZODone  50 MG tablet Commonly known as: DESYREL  Take 1 tablet (50 mg total) by mouth at bedtime.    Victoza 18 MG/3ML Sopn Generic drug: liraglutide Inject into the skin.          Follow-up recommendations:  Activity:  As tolerated    Signed: Shefali Ng, MD 11/09/2023, 9:15 AM

## 2023-11-09 NOTE — Group Note (Signed)
 Date:  11/09/2023 Time:  1:41 PM  Group Topic/Focus:  Coping With Mental Health Crisis:   The purpose of this group is to help patients identify strategies for coping with mental health crisis.  Group discusses possible causes of crisis and ways to manage them effectively. Healthy Communication:   The focus of this group is to discuss communication, barriers to communication, as well as healthy ways to communicate with others. Overcoming Stress:   The focus of this group is to define stress and help patients assess their triggers.    Participation Level:  Active  Participation Quality:  Appropriate  Affect:  Appropriate  Cognitive:  Appropriate  Insight: Appropriate  Engagement in Group:  Engaged  Modes of Intervention:  Activity and Discussion  Additional Comments:    Kelly Doyle L Teron Blais 11/09/2023, 1:41 PM

## 2023-11-09 NOTE — Group Note (Signed)
 LCSW Group Therapy Note  Group Date: 11/09/2023 Start Time: 1315 End Time: 1400   Type of Therapy and Topic:  Group Therapy - Healthy vs Unhealthy Coping Skills  Participation Level:  Did Not Attend   Description of Group The focus of this group was to determine what unhealthy coping techniques typically are used by group members and what healthy coping techniques would be helpful in coping with various problems. Patients were guided in becoming aware of the differences between healthy and unhealthy coping techniques. Patients were asked to identify 2-3 healthy coping skills they would like to learn to use more effectively.  Therapeutic Goals Patients learned that coping is what human beings do all day long to deal with various situations in their lives Patients defined and discussed healthy vs unhealthy coping techniques Patients identified their preferred coping techniques and identified whether these were healthy or unhealthy Patients determined 2-3 healthy coping skills they would like to become more familiar with and use more often. Patients provided support and ideas to each other   Summary of Patient Progress:  X   Therapeutic Modalities Cognitive Behavioral Therapy Motivational Interviewing  Kelly Doyle, [place] 11/09/2023  3:40 PM

## 2023-11-09 NOTE — Progress Notes (Signed)

## 2023-11-09 NOTE — Plan of Care (Signed)
  Problem: Education: Goal: Utilization of techniques to improve thought processes will improve Outcome: Progressing   Problem: Activity: Goal: Interest or engagement in leisure activities will improve Outcome: Progressing   Problem: Coping: Goal: Coping ability will improve Outcome: Progressing   Problem: Safety: Goal: Ability to identify and utilize support systems that promote safety will improve Outcome: Progressing   Problem: Self-Concept: Goal: Level of anxiety will decrease Outcome: Progressing   Problem: Activity: Goal: Imbalance in normal sleep/wake cycle will improve Outcome: Progressing

## 2023-11-09 NOTE — Progress Notes (Signed)
   11/09/23 0200  Psych Admission Type (Psych Patients Only)  Admission Status Voluntary  Psychosocial Assessment  Patient Complaints None  Eye Contact Fair  Facial Expression Flat  Affect Appropriate to circumstance  Speech Logical/coherent  Interaction Assertive  Motor Activity Slow  Appearance/Hygiene In scrubs  Behavior Characteristics Calm  Mood Depressed  Aggressive Behavior  Effect No apparent injury  Thought Process  Coherency WDL  Content WDL  Delusions None reported or observed  Perception WDL  Hallucination None reported or observed  Judgment Impaired  Confusion None  Danger to Self  Current suicidal ideation? Denies  Danger to Others  Danger to Others None reported or observed

## 2023-11-10 ENCOUNTER — Other Ambulatory Visit (INDEPENDENT_AMBULATORY_CARE_PROVIDER_SITE_OTHER): Payer: Self-pay | Admitting: Nurse Practitioner

## 2023-11-10 ENCOUNTER — Encounter (INDEPENDENT_AMBULATORY_CARE_PROVIDER_SITE_OTHER): Admitting: Nurse Practitioner

## 2023-11-10 ENCOUNTER — Encounter (INDEPENDENT_AMBULATORY_CARE_PROVIDER_SITE_OTHER)

## 2023-11-10 DIAGNOSIS — I739 Peripheral vascular disease, unspecified: Secondary | ICD-10-CM

## 2023-12-03 IMAGING — CR DG CHEST 2V
2 series · 2 of 2 positions shown · non-contrast
Comparison: None.

CLINICAL DATA: Suspected sepsis

EXAM:
CHEST - 2 VIEW

[chest pa]
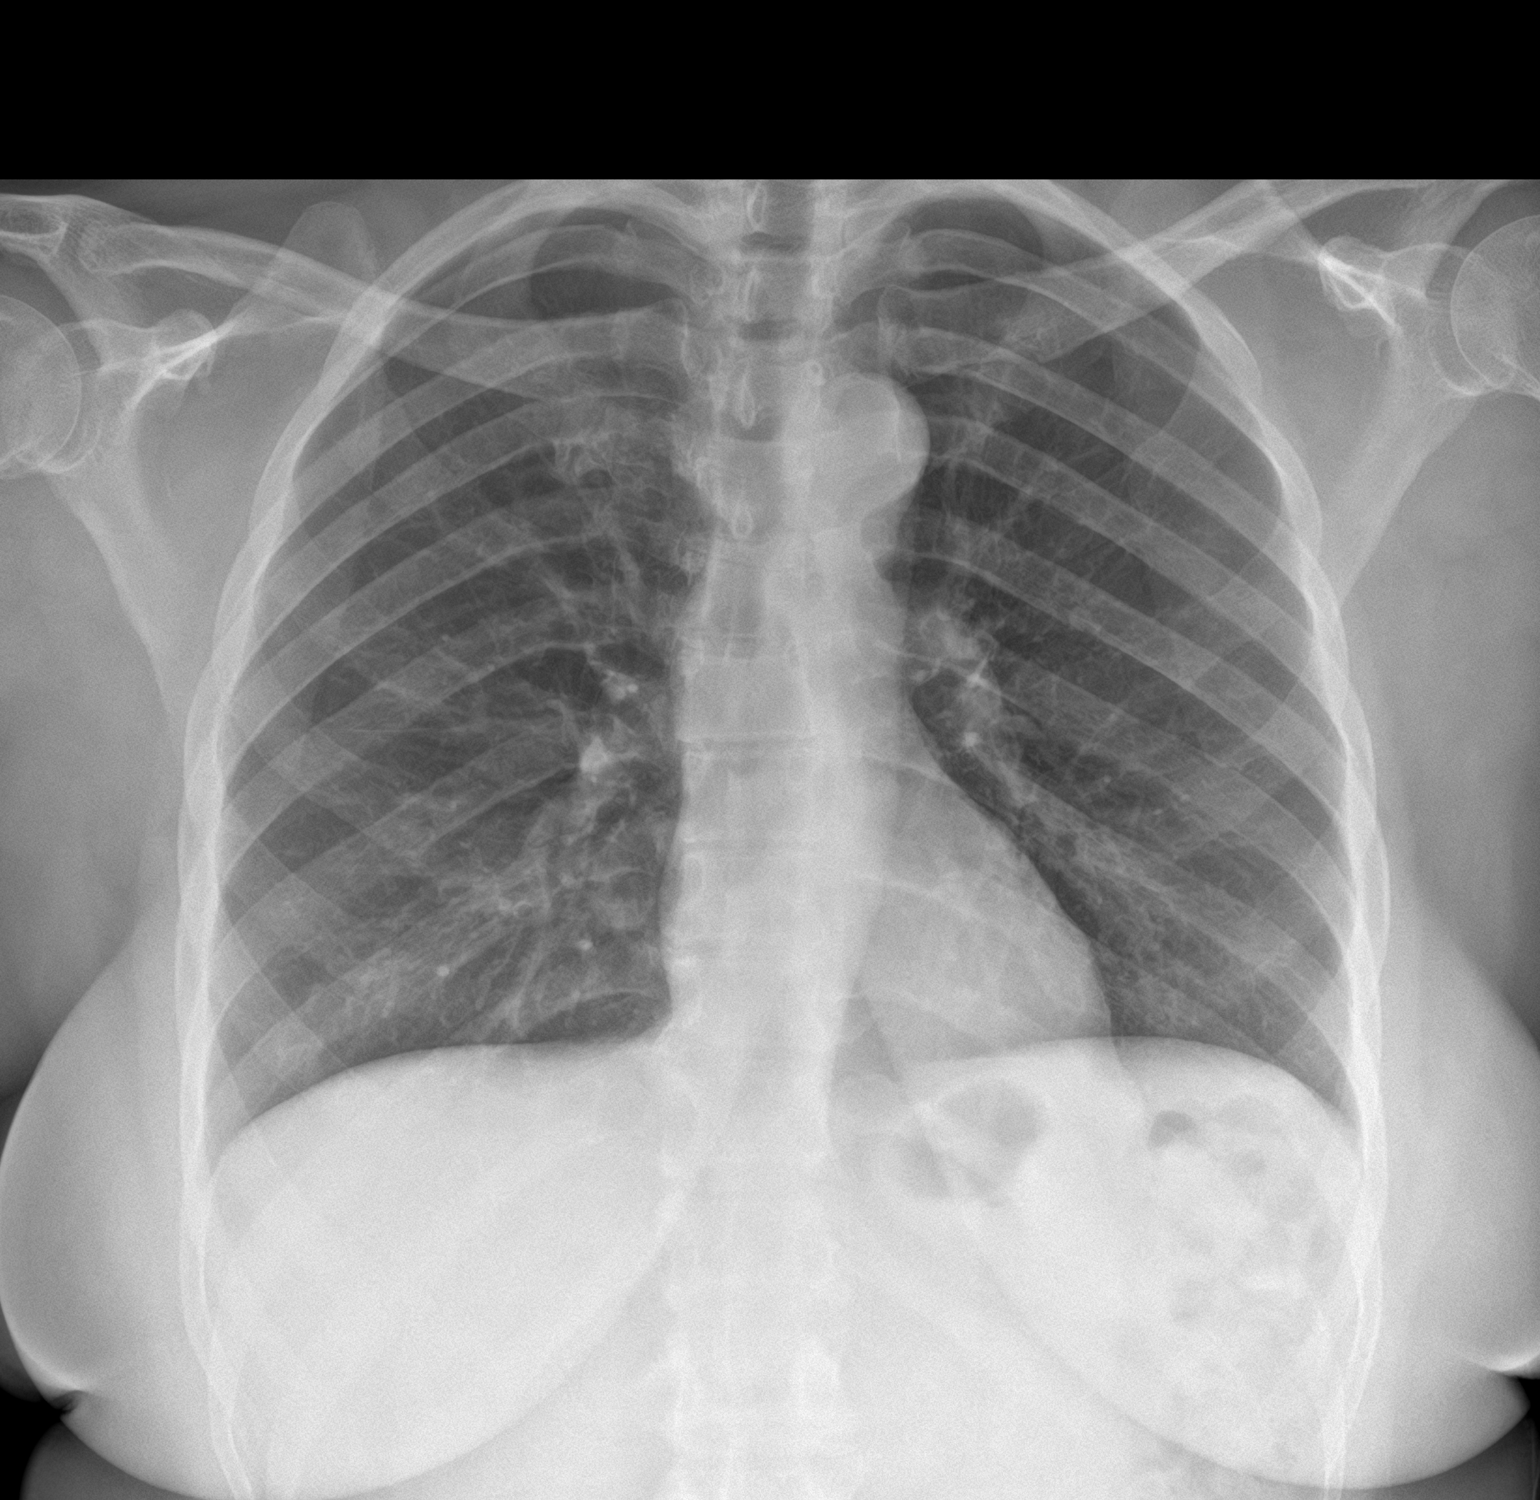

[chest lat]
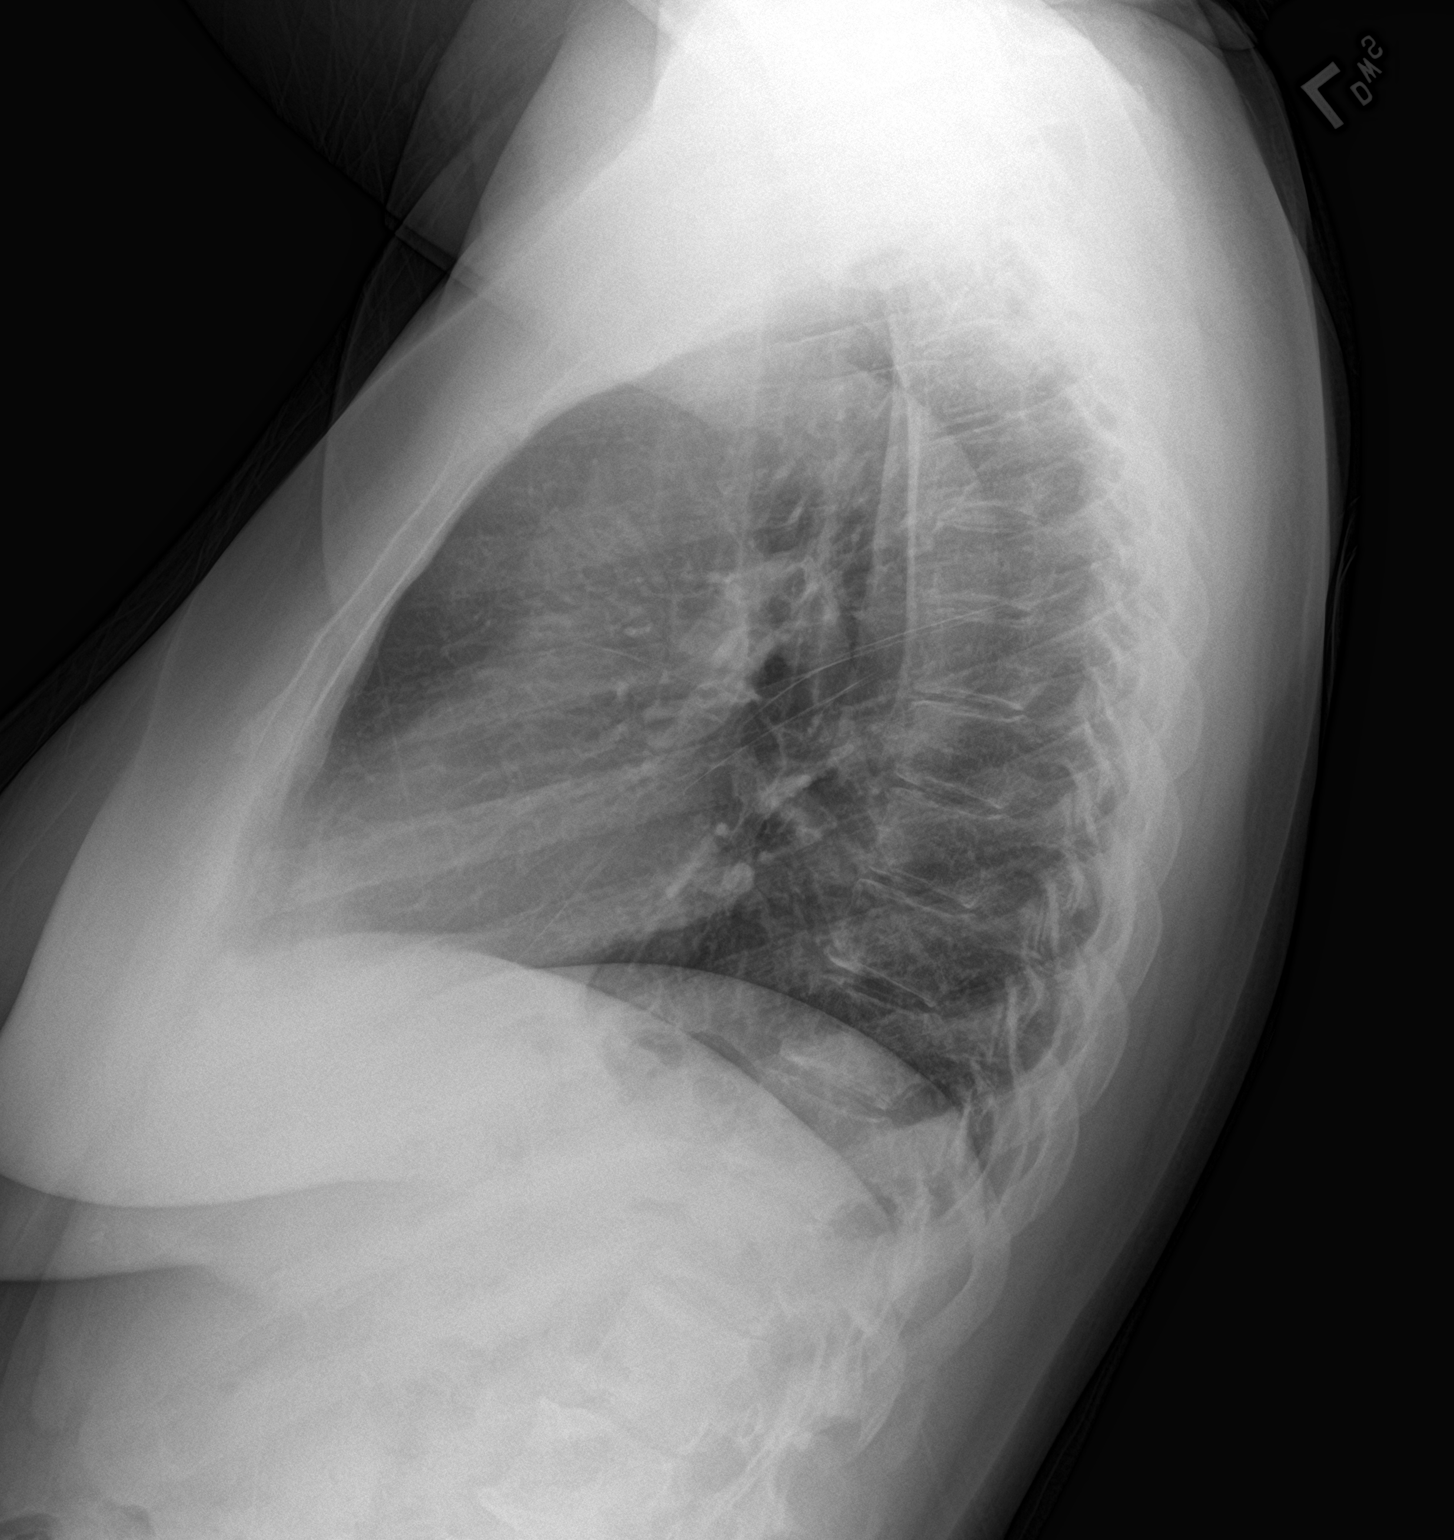

[2 of 2 positions shown; findings below may reference images not displayed]

FINDINGS: The lungs are clear and negative for focal airspace consolidation,
pulmonary edema or suspicious pulmonary nodule. No pleural effusion
or pneumothorax. Cardiac and mediastinal contours are within normal
limits. No acute fracture or lytic or blastic osseous lesions. The
visualized upper abdominal bowel gas pattern is unremarkable.
IMPRESSION: Negative chest x-ray

## 2023-12-03 IMAGING — MR MR HEAD W/O CM
12 series · 46 of 48 positions shown · non-contrast
Comparison: No pertinent prior exam.

CLINICAL DATA: Acute neurologic deficit

EXAM:
MRI HEAD WITHOUT CONTRAST
MRA HEAD WITHOUT CONTRAST
TECHNIQUE: Multiplanar, multi-echo pulse sequences of the brain and surrounding
structures were acquired without intravenous contrast. Angiographic
images of the Circle of Willis were acquired using MRA technique
without intravenous contrast.

[Series 5: ax dwi_tracew · axial · 3.0mm · 0.65mm/px · z∈[-70,+64]mm · 3 of 42 slices shown]
[im 1/42]
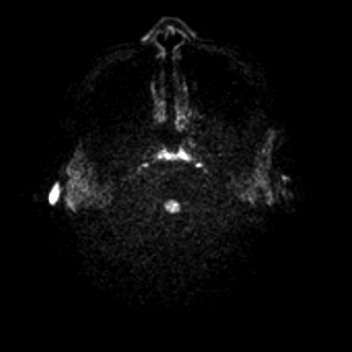
[im 21/42]
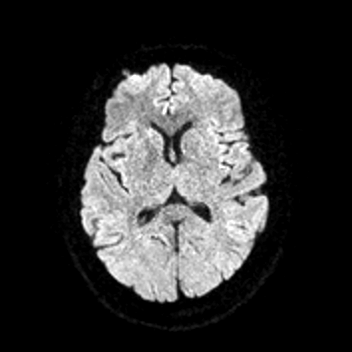
[im 42/42]
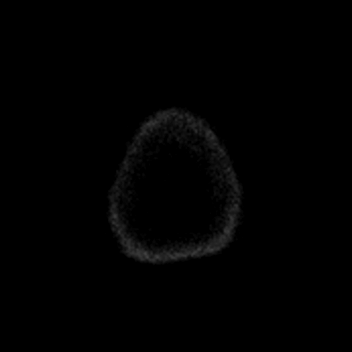

[Series 6: ax dwi_adc · axial · 3.0mm · 0.65mm/px · z∈[-70,+64]mm · 3 of 40 slices shown]
[im 1/40]
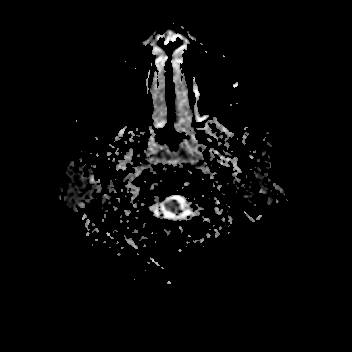
[im 20/40]
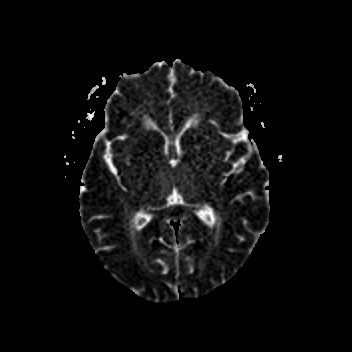
[im 40/40]
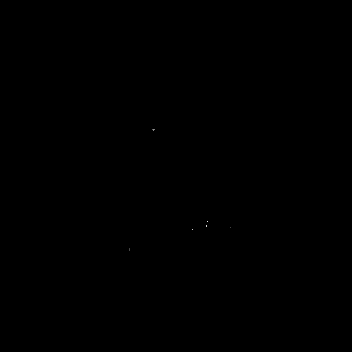

[Series 7: cor dwi_tracew · coronal · 5.0mm · 0.60mm/px · 3 of 32 slices shown]
[im 1/32]
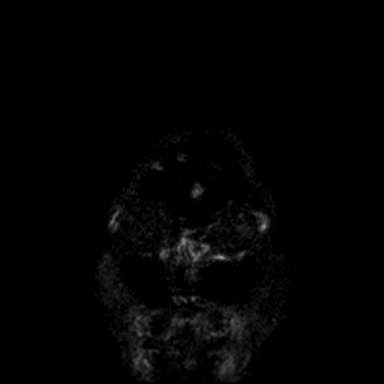
[im 16/32]
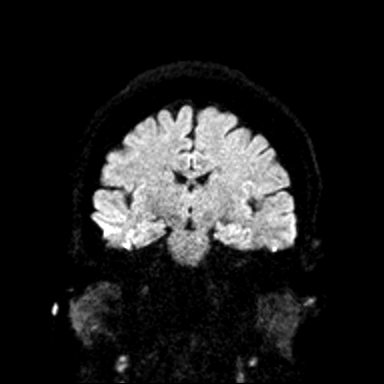
[im 32/32]
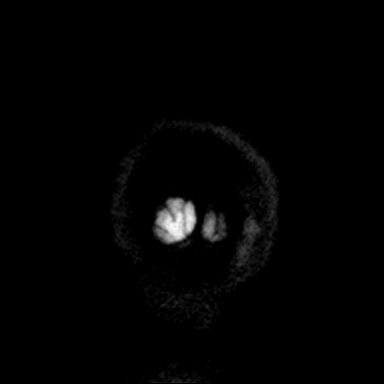

[Series 8: cor dwi_adc · coronal · 5.0mm · 0.60mm/px · 2 of 30 slices shown]
[im 1/30]
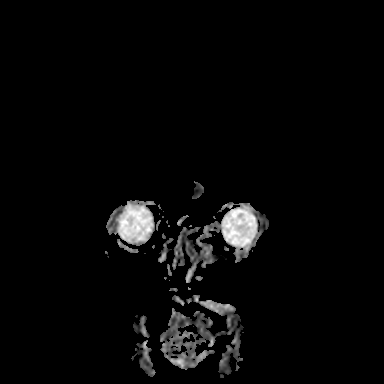
[im 30/30]
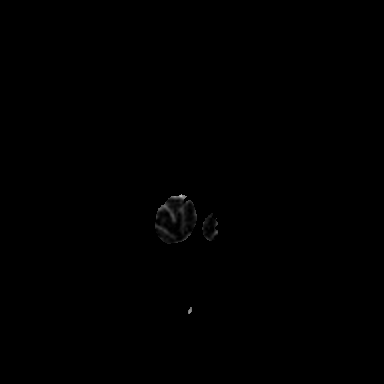

[Series 9: T1 · sagittal · 5.0mm · 0.62mm/px · 2 of 20 slices shown (1 of 2)]
[im 1/20]
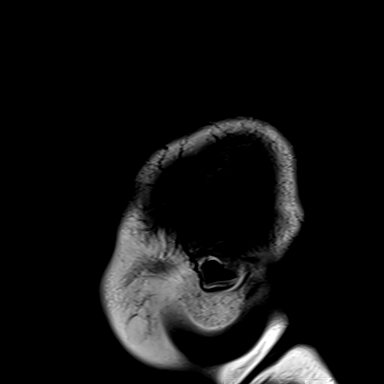
[im 20/20]
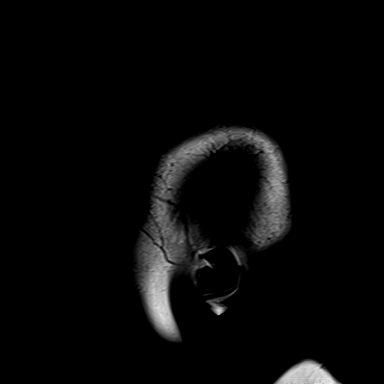

[Series 10: T2 · axial · 5.0mm · 0.53mm/px · z∈[-66,+59]mm · 2 of 22 slices shown (1 of 2)]
[im 1/22]
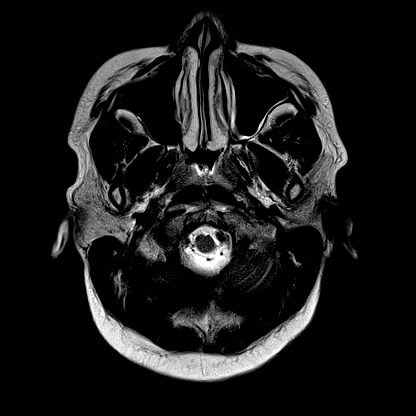
[im 22/22]
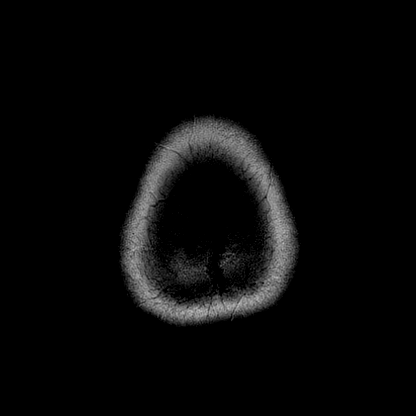

[Series 11: mag_images · axial · 3.0mm · 0.90mm/px · z∈[-91,+85]mm · 5 of 60 slices shown]
[im 1/60]
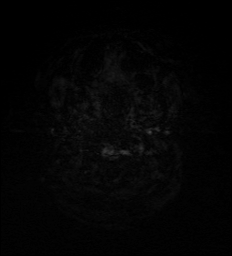
[im 15/60]
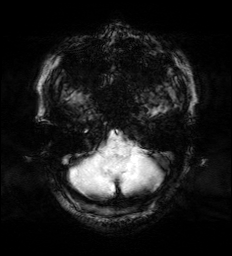
[im 30/60]
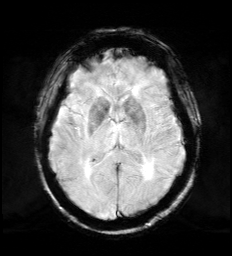
[im 45/60]
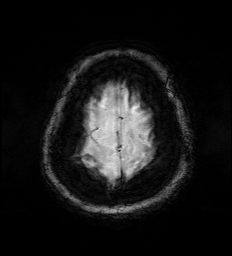
[im 60/60]
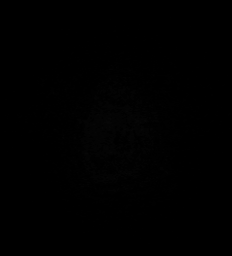

[Series 12: pha_images · axial · 3.0mm · 0.90mm/px · z∈[-91,+82]mm · 5 of 59 slices shown]
[im 1/59]
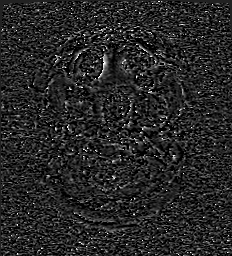
[im 15/59]
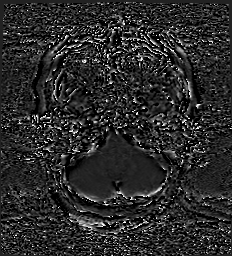
[im 30/59]
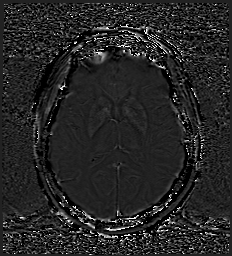
[im 44/59]
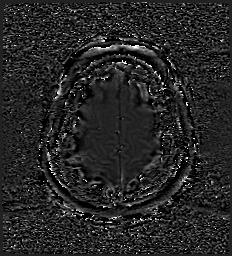
[im 59/59]
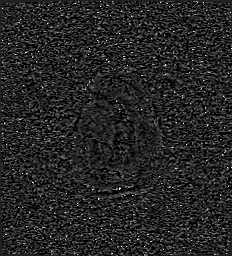

[Series 13: swi_images · axial · 3.0mm · 0.90mm/px · z∈[-91,+85]mm · 5 of 60 slices shown]
[im 1/60]
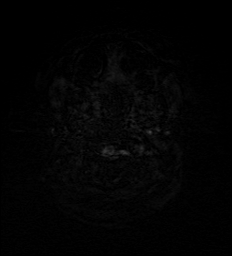
[im 15/60]
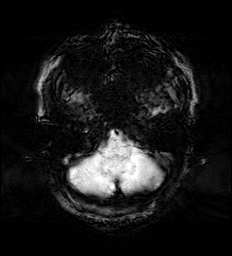
[im 30/60]
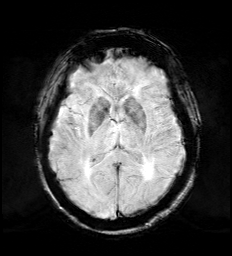
[im 45/60]
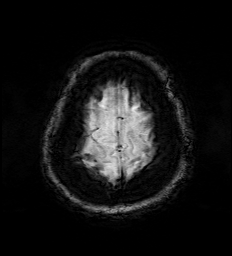
[im 60/60]
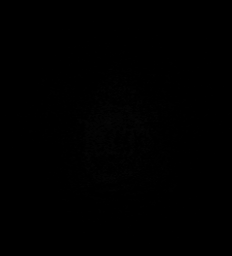

[Series 15: FLAIR · axial · 3.0mm · 0.53mm/px · z∈[-87,+81]mm · 4 of 48 slices shown]
[im 1/48]
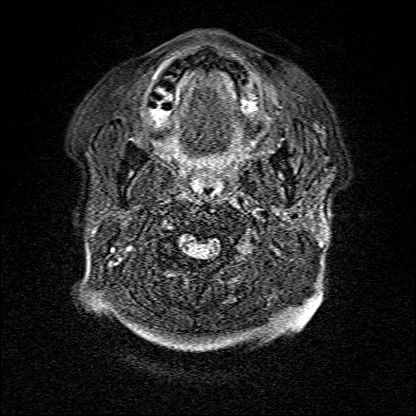
[im 16/48]
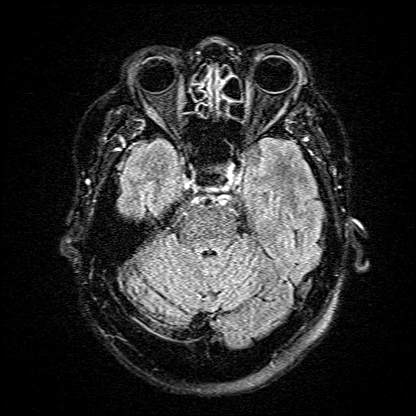
[im 32/48]
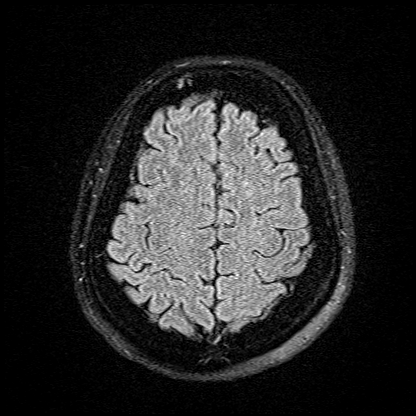
[im 48/48]
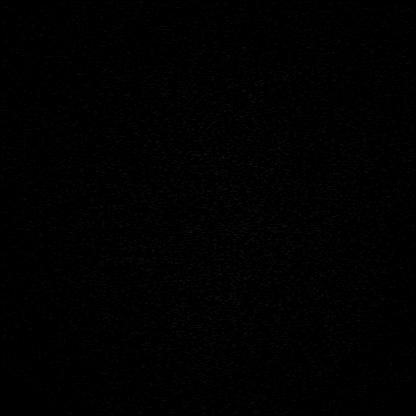

[Series 16: T1 · axial · 1.0mm · 0.98mm/px · z∈[-73,+69]mm · 10 of 144 slices shown (2 of 2)]
[im 1/144]
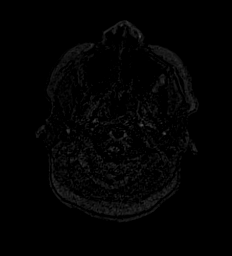
[im 14/144]
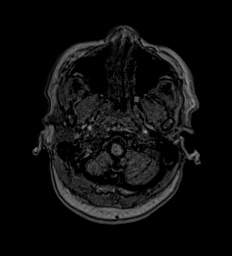
[im 27/144]
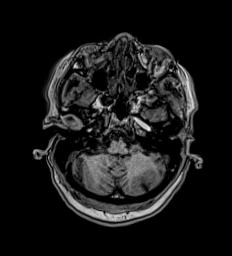
[im 40/144]
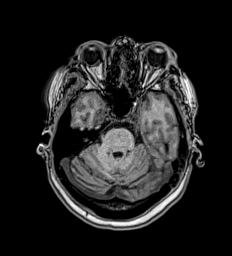
[im 53/144]
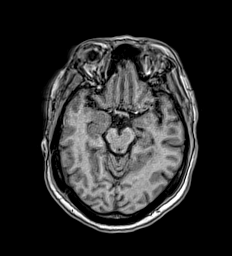
[im 66/144]
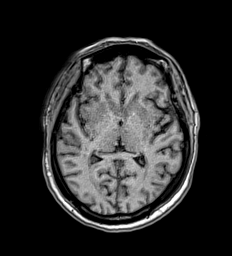
[im 79/144]
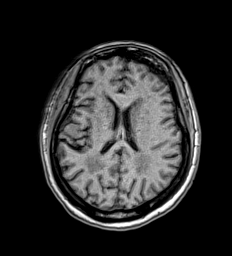
[im 105/144]
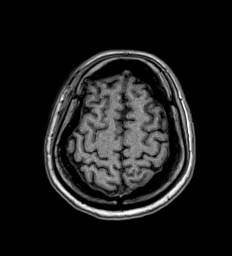
[im 118/144]
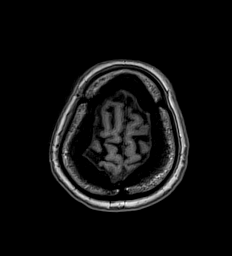
[im 144/144]
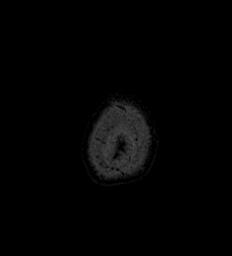

[Series 17: T2 · coronal · 5.0mm · 0.57mm/px · 2 of 26 slices shown (2 of 2)]
[im 1/26]
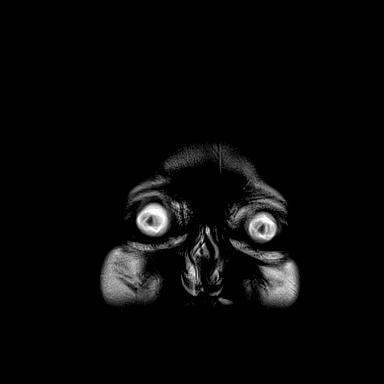
[im 26/26]
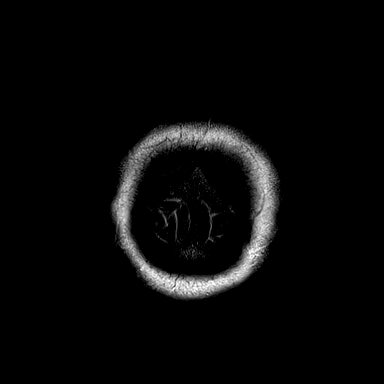

[46 of 48 positions shown; findings below may reference images not displayed]

FINDINGS: MRI HEAD FINDINGS

Brain: No acute infarct, mass effect or extra-axial collection. No
acute or chronic hemorrhage. There is multifocal hyperintense
T2-weighted signal within the white matter. Parenchymal volume and
CSF spaces are normal. Dilated perivascular space in the left
lentiform nucleus the midline structures are normal.

Vascular: Major flow voids are preserved.

Skull and upper cervical spine: Normal calvarium and skull base.
Visualized upper cervical spine and soft tissues are normal.

Sinuses/Orbits:No paranasal sinus fluid levels or advanced mucosal
thickening. No mastoid or middle ear effusion. Normal orbits.

MRA HEAD FINDINGS

POSTERIOR CIRCULATION:

--Vertebral arteries: Normal

--Inferior cerebellar arteries: Normal.

--Basilar artery: Normal.

--Superior cerebellar arteries: Normal.

--Posterior cerebral arteries: Multifocal irregularity, possibly
exaggerated by motion

ANTERIOR CIRCULATION:

--Intracranial internal carotid arteries: Normal.

--Anterior cerebral arteries (ACA): Normal.

--Middle cerebral arteries (MCA): Multifocal irregularity, greatest
at the origin of the right M1 segment. This may be somewhat
exaggerated by motion

ANATOMIC VARIANTS: None
IMPRESSION: 1. No acute intracranial abnormality.
2. Multifocal irregularity, greatest at the origin of the right MCA
M1 segment, which may be somewhat exaggerated by motion artifact.

## 2023-12-06 ENCOUNTER — Other Ambulatory Visit: Payer: Self-pay

## 2024-03-30 ENCOUNTER — Other Ambulatory Visit: Payer: Self-pay | Admitting: Family Medicine

## 2024-03-30 DIAGNOSIS — Z13820 Encounter for screening for osteoporosis: Secondary | ICD-10-CM

## 2024-03-31 ENCOUNTER — Other Ambulatory Visit: Payer: Self-pay | Admitting: Family Medicine

## 2024-03-31 DIAGNOSIS — Z1231 Encounter for screening mammogram for malignant neoplasm of breast: Secondary | ICD-10-CM

## 2024-04-13 ENCOUNTER — Other Ambulatory Visit: Payer: Self-pay

## 2024-04-13 ENCOUNTER — Emergency Department (EMERGENCY_DEPARTMENT_HOSPITAL)
Admission: EM | Admit: 2024-04-13 | Discharge: 2024-04-14 | Disposition: A | Discharge status: Other Acute Inpatient Hospital | Source: Home / Self Care

## 2024-04-13 DIAGNOSIS — F332 Major depressive disorder, recurrent severe without psychotic features: Secondary | ICD-10-CM | POA: Insufficient documentation

## 2024-04-13 DIAGNOSIS — N189 Chronic kidney disease, unspecified: Secondary | ICD-10-CM | POA: Insufficient documentation

## 2024-04-13 DIAGNOSIS — R45851 Suicidal ideations: Secondary | ICD-10-CM | POA: Insufficient documentation

## 2024-04-13 DIAGNOSIS — E1122 Type 2 diabetes mellitus with diabetic chronic kidney disease: Secondary | ICD-10-CM | POA: Insufficient documentation

## 2024-04-13 HISTORY — DX: Unspecified dementia, unspecified severity, without behavioral disturbance, psychotic disturbance, mood disturbance, and anxiety: F03.90

## 2024-04-13 LAB — ETHANOL: Alcohol, Ethyl (B): <15 mg/dL

## 2024-04-13 LAB — URINE DRUG SCREEN
Amphetamines: NEGATIVE
Barbiturates: NEGATIVE
Benzodiazepines: NEGATIVE
Cocaine: NEGATIVE
Fentanyl: NEGATIVE
Methadone Scn, Ur: NEGATIVE
Opiates: NEGATIVE
Tetrahydrocannabinol: NEGATIVE

## 2024-04-13 LAB — COMPREHENSIVE METABOLIC PANEL WITH GFR
ALT: 15 U/L (ref 0–44)
AST: 21 U/L (ref 15–41)
Albumin: 4.4 g/dL (ref 3.5–5.0)
Alkaline Phosphatase: 95 U/L (ref 38–126)
Anion gap: 14 (ref 5–15)
BUN: 18 mg/dL (ref 8–23)
CO2: 24 mmol/L (ref 22–32)
Calcium: 10 mg/dL (ref 8.9–10.3)
Chloride: 100 mmol/L (ref 98–111)
Creatinine, Ser: 1.05 mg/dL — ABNORMAL HIGH (ref 0.44–1.00)
GFR, Estimated: 57 mL/min — ABNORMAL LOW
Glucose, Bld: 323 mg/dL — ABNORMAL HIGH (ref 70–99)
Potassium: 4.5 mmol/L (ref 3.5–5.1)
Sodium: 139 mmol/L (ref 135–145)
Total Bilirubin: 0.3 mg/dL (ref 0.0–1.2)
Total Protein: 7.6 g/dL (ref 6.5–8.1)

## 2024-04-13 LAB — CBG MONITORING, ED
Glucose-Capillary: 216 mg/dL — ABNORMAL HIGH (ref 70–99)
Glucose-Capillary: 253 mg/dL — ABNORMAL HIGH (ref 70–99)

## 2024-04-13 LAB — RESP PANEL BY RT-PCR (RSV, FLU A&B, COVID)  RVPGX2
Influenza A by PCR: NEGATIVE
Influenza B by PCR: NEGATIVE
Resp Syncytial Virus by PCR: NEGATIVE
SARS Coronavirus 2 by RT PCR: NEGATIVE

## 2024-04-13 LAB — CBC
HCT: 39.1 % (ref 36.0–46.0)
Hemoglobin: 12.7 g/dL (ref 12.0–15.0)
MCH: 29.7 pg (ref 26.0–34.0)
MCHC: 32.5 g/dL (ref 30.0–36.0)
MCV: 91.6 fL (ref 80.0–100.0)
Platelets: 248 K/uL (ref 150–400)
RBC: 4.27 MIL/uL (ref 3.87–5.11)
RDW: 12.3 % (ref 11.5–15.5)
WBC: 7.6 K/uL (ref 4.0–10.5)
nRBC: 0 % (ref 0.0–0.2)

## 2024-04-13 LAB — URINALYSIS, ROUTINE W REFLEX MICROSCOPIC
Bacteria, UA: NONE SEEN
Bilirubin Urine: NEGATIVE
Glucose, UA: >=500 mg/dL — AB
Hgb urine dipstick: NEGATIVE
Ketones, ur: NEGATIVE mg/dL
Leukocytes,Ua: NEGATIVE
Nitrite: NEGATIVE
Protein, ur: 100 mg/dL — AB
Specific Gravity, Urine: 1.028 (ref 1.005–1.030)
pH: 5 (ref 5.0–8.0)

## 2024-04-13 MED ORDER — AMLODIPINE BESYLATE 5 MG PO TABS
10.0000 mg | ORAL_TABLET | Freq: Every day | ORAL | Status: DC
  Administered 2024-04-13 – 2024-04-14 (×2): 10 mg via ORAL
  Filled 2024-04-13 (×2): qty 2

## 2024-04-13 MED ORDER — EMPAGLIFLOZIN 25 MG PO TABS
25.0000 mg | ORAL_TABLET | Freq: Every day | ORAL | Status: DC
  Administered 2024-04-14: 25 mg via ORAL
  Filled 2024-04-13 (×2): qty 1

## 2024-04-13 MED ORDER — INSULIN ASPART 100 UNIT/ML IJ SOLN
0.0000 [IU] | Freq: Three times a day (TID) | INTRAMUSCULAR | Status: DC
  Administered 2024-04-13 – 2024-04-14 (×2): 5 [IU] via SUBCUTANEOUS
  Filled 2024-04-13 (×2): qty 5

## 2024-04-13 MED ORDER — ATORVASTATIN CALCIUM 20 MG PO TABS
40.0000 mg | ORAL_TABLET | Freq: Every day | ORAL | Status: DC
  Administered 2024-04-13 – 2024-04-14 (×2): 40 mg via ORAL
  Filled 2024-04-13 (×2): qty 2

## 2024-04-13 MED ORDER — PANTOPRAZOLE SODIUM 40 MG PO TBEC
40.0000 mg | DELAYED_RELEASE_TABLET | Freq: Every day | ORAL | Status: DC
  Administered 2024-04-13 – 2024-04-14 (×2): 40 mg via ORAL
  Filled 2024-04-13 (×2): qty 1

## 2024-04-13 MED ORDER — LISINOPRIL 10 MG PO TABS
40.0000 mg | ORAL_TABLET | Freq: Every day | ORAL | Status: DC
  Administered 2024-04-13 – 2024-04-14 (×2): 40 mg via ORAL
  Filled 2024-04-13 (×2): qty 4

## 2024-04-13 MED ORDER — ACETAMINOPHEN 325 MG PO TABS
975.0000 mg | ORAL_TABLET | Freq: Once | ORAL | Status: DC
  Filled 2024-04-13: qty 3

## 2024-04-13 NOTE — BH Assessment (Signed)
 Comprehensive Clinical Assessment (CCA) Note  04/13/2024 Rai Sinagra Izard [id number]  Chief Complaint:  Chief Complaint  Patient presents with   Suicidal   Visit Diagnosis: Major Depressive Disorder   Blakeleigh Domek. Locher is a 68 year old female who presents to the ER, due to having thoughts to end her life and family is concerned for her safety. Per the patient, she was living with her cousin in her car and they had an argument. The cousin will not let the patient stay with her anymore. The patient's two daughters will not let her stay with them either. They had a conflict approximately three years ago, and their relationship remained strained. Per the patient, the son is going share a place with her but he told her she needs to get help and get stable before that happens. She states she have lived in five different places within the last two months. She reports, she doesn't understand why people don't want to help her or deal with her. She also states, she often misunderstands what people says, take offense and goes off on them.  Per the report of the patient's son Octavio [phone]), he moved back to [place] from [place] to help his mother. She told him today (04/13/2024), she was having thoughts of ending her life. He states this is the first time he has ever heard her say anything like that and he became concerned she would do it. He shared the same stressors of the patient and believes it's enough for her to try to end her life. He also states, she takes medications for diabetes and believes she isn't taking it and concerned for her physical health as well.  During the interview, the patient was calm, cooperative and pleasant. She was able to provide appropriate answers to the questions. She endorses SI with no plan. She denies HI and AV/H. She admits to rare us  of alcohol and denies the use of any other mind-altering substances. She denies involvement with the legal system.  CCA  Screening, Triage and Referral (STR)  Patient Reported Information How did you hear about us ? Family/Friend  What Is the Reason for Your Visit/Call Today? Patient having thoughts to end her life and family is concerned for her safety.  How Long Has This Been Causing You Problems? 1-6 months  What Do You Feel Would Help You the Most Today? Treatment for Depression or other mood problem   Have You Recently Had Any Thoughts About Hurting Yourself? Yes  Are You Planning to Commit Suicide/Harm Yourself At This time? No   Flowsheet Row ED from 04/13/20[facility]charged[facility] from 11/02/2023 in [facility] BEH[facility] MEDICINE ED from 11/01/2023 in [facility] Department at [facility]  C-SSRS RISK CATEGORY High Risk Error: Question 6 not populated High Risk    Have you Recently Had Thoughts About Hurting Someone Sherral? No  Are You Planning to Harm Someone at This Time? No  Explanation: Pt endorsed having passive SI stating, What is my purpose for being around? Why am I here? That's why I'd be better off dead.   Have You Used Any Alcohol or Drugs in the Past 24 Hours? No  How Long Ago Did You Use Drugs or Alcohol? No data recorded What Did You Use and How Much? No data recorded  Do You Currently Have a Therapist/Psychiatrist? No  Name of Therapist/Psychiatrist:    Have You Been Recently Discharged From Any Office Practice or Programs? No  Explanation of Discharge From Practice/Program: No data recorded    CCA Screening Triage Referral Assessment Type of Contact: Face-to-Face  Telemedicine Service Delivery:   Is this Initial or Reassessment?   Date Telepsych consult ordered in [facility]:    Time Telepsych consult ordered in [facility]:    Location of Assessment: [facility] ED  Provider Location: [facility] ED   Collateral Involvement: Spoke with the patients son   Does Patient Have a Automotive Engineer Guardian?  No  Legal Guardian Contact Information: n/a  Copy of Legal Guardianship Form: -- (n/a)  Legal Guardian Notified of Arrival: -- (n/a)  Legal Guardian Notified of Pending Discharge: -- (n/a)  If Minor and Not Living with Parent(s), Who has Custody? n/a  Is CPS involved or ever been involved? Never  Is APS involved or ever been involved? Never   Patient Determined To Be At Risk for Harm To Self or Others Based on Review of Patient Reported Information or Presenting Complaint? Yes, for Self-Harm  Method: No Plan  Availability of Means: Has close by  Intent: Vague intent or NA  Notification Required: No need or identified person  Additional Information for Danger to Others Potential: -- (n/a)  Additional Comments for Danger to Others Potential: n/a  Are There Guns or Other Weapons in Your Home? No  Types of Guns/Weapons: Knives  Are These Weapons Safely Secured?                            -- (n/a)  Who Could Verify You Are Able To Have These Secured: n/a  Do You Have any Outstanding Charges, Pending Court Dates, Parole/Probation? None reported  Contacted To Inform of Risk of Harm To Self or Others: -- (n/a)    Does Patient Present under Involuntary Commitment? No    [place] of Residence: [place]   Patient Currently Receiving the Following Services: Not Receiving Services   Determination of Need: Emergent (2 hours)   Options For Referral: ED Visit     CCA Biopsychosocial Patient Reported Schizophrenia/Schizoaffective Diagnosis in Past: No   Strengths: Have support system, some insight and seeking help.   Mental Health Symptoms Depression:  Difficulty Concentrating; Change in energy/activity; Fatigue; Hopelessness; Tearfulness   Duration of Depressive symptoms: Duration of Depressive Symptoms: Greater than two weeks   Mania:  N/A   Anxiety:   Worrying; Tension; Restlessness; Irritability; Difficulty concentrating   Psychosis:  None   Duration  of Psychotic symptoms:    Trauma:  N/A   Obsessions:  N/A   Compulsions:  N/A   Inattention:  N/A   Hyperactivity/Impulsivity:  N/A   Oppositional/Defiant Behaviors:  N/A   Emotional Irregularity:  N/A   Other Mood/Personality Symptoms:  No data recorded   Mental Status Exam Appearance and self-care  Stature:  Average   Weight:  Average weight   Clothing:  Neat/clean; Age-appropriate   Grooming:  Normal   Cosmetic use:  None   Posture/gait:  Normal   Motor activity:  -- (Within normal range)   Sensorium  Attention:  Normal   Concentration:  Anxiety interferes; Preoccupied   Orientation:  X5   Recall/memory:  Normal   Affect and Mood  Affect:  Depressed   Mood:  Depressed; Anxious   Relating  Eye contact:  Normal   Facial expression:  Depressed; Responsive   Attitude toward examiner:  Cooperative   Thought and Language  Speech flow: Clear and Coherent; Normal   Thought  content:  Appropriate to Mood and Circumstances   Preoccupation:  None   Hallucinations:  None   Organization:  Coherent; Intact   [employer] of Knowledge:  Average   Intelligence:  Average   Abstraction:  Functional   Judgement:  Poor   Reality Testing:  Adequate   Insight:  Poor   Decision Making:  Impulsive   Social Functioning  Social Maturity:  Impulsive; Isolates; Self-centered   Social Judgement:  Heedless; Chief Of Staff   Stress  Stressors:  Family conflict; Transitions; Housing   Coping Ability:  Overwhelmed; Exhausted   Skill Deficits:  Responsibility   Supports:  Support needed     Religion: Religion/Spirituality Are You A Religious Person?: No  Leisure/Recreation: Leisure / Recreation Do You Have Hobbies?: No  Exercise/Diet: Exercise/Diet Do You Exercise?: No Have You Gained or Lost A Significant Amount of Weight in the Past Six Months?: No Do You Follow a Special Diet?: No Do You Have Any Trouble Sleeping?:  No   CCA Employment/Education Employment/Work Situation: Employment / Work Situation Employment Situation: Unemployed Patient's Job has Been Impacted by Current Illness: No Has Patient ever Been in Equities Trader?: No  Education: Education Is Patient Currently Attending School?: No Did You Have An Individualized Education Program (IIEP): No Did You Have Any Difficulty At [employer]?: No Patient's Education Has Been Impacted by Current Illness: No   CCA Family/Childhood History Family and Relationship History: Family history Marital status: Single Does patient have children?: Yes How many children?: 3 How is patient's relationship with their children?: The relationship with her two daughters is strained and the relationship with her son is good.  Childhood History:  Childhood History By whom was/is the patient raised?: Mother Did patient suffer any verbal/emotional/physical/sexual abuse as a child?: No Did patient suffer from severe childhood neglect?: No Has patient ever been sexually abused/assaulted/raped as an adolescent or adult?: No Was the patient ever a victim of a crime or a disaster?: No Witnessed domestic violence?: No Has patient been affected by domestic violence as an adult?: No       CCA Substance Use Alcohol/Drug Use: Alcohol / Drug Use Pain Medications: See MAR Prescriptions: See MAR Over the Counter: See MAR History of alcohol / drug use?: No history of alcohol / drug abuse Longest period of sobriety (when/how long): n/a                         ASAM's:  Six Dimensions of Multidimensional Assessment  Dimension 1:  Acute Intoxication and/or Withdrawal Potential:      Dimension 2:  Biomedical Conditions and Complications:      Dimension 3:  Emotional, Behavioral, or Cognitive Conditions and Complications:     Dimension 4:  Readiness to Change:     Dimension 5:  Relapse, Continued use, or Continued Problem Potential:     Dimension 6:   Recovery/Living Environment:     ASAM Severity Score:    ASAM Recommended Level of Treatment:     Substance use Disorder (SUD)    Recommendations for Services/Supports/Treatments:    Disposition Recommendation per psychiatric provider: Pending Psych Inpatient Treatment   DSM5 Diagnoses: Patient Active Problem List   Diagnosis Date Noted   MDD (major depressive disorder), recurrent severe, without psychosis (HCC) 11/02/2023   COVID-19 virus infection 04/27/2021   Hyperglycemia due to type 2 diabetes mellitus (HCC) 04/27/2021   Syncope and collapse 04/27/2021   Insomnia 02/13/2016   Stage 3b  chronic kidney disease (HCC) 02/13/2016   Hyperlipidemia 10/03/2015   Iron deficiency anemia 10/03/2015   Major depressive disorder, single episode, severe without psychotic features (HCC) 10/03/2014   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

## 2024-04-13 NOTE — ED Notes (Signed)
 Pt denies SI/HI/AVH on assessment. Pt reports moved here with a friend 3-4 weeks ago and they have not been able to find housing. She reports that she has been fighting a lot with her friend but cannot state reason why. Pt also says no family support at this time.

## 2024-04-13 NOTE — Consult Note (Signed)
 Psychiatry consult was received and reviewed. Blood work is noted to still be pending collection per chart review. Psychiatry will await completion of blood work results and medical clearance prior to proceeding with psychiatric evaluation so that appropriate disposition recommendations can be made.

## 2024-04-13 NOTE — ED Notes (Signed)
 Dinner tray provided to pt

## 2024-04-13 NOTE — ED Notes (Signed)
 Belongings collected   Blue/ black pants Grey shoes Tan hoodie White bra [employer]

## 2024-04-13 NOTE — ED Triage Notes (Signed)
 Pt to ED brought by her son for psych evaluation. Son states she is losing weight, not eating, not taking her medications and has said a few times that she is suicidal. 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 States she has no where to stay and has been sleeping in somebody's car. Son states he just moved here and does not really know what is going on with her and I need her to be admitted so she can start taking her medicines.  When asked, pt endorses SI since about 2 weeks. Denies plan. Pt appears to be alert and oriented at this time.

## 2024-04-13 NOTE — ED Provider Notes (Signed)
 "  [facility] Provider Note    Event Date/Time   First MD Initiated Contact with Patient 04/13/24 1451     (approximate)   History   Suicidal   HPI  Kelly Doyle is a 68 y.o. female presenting today with concern of worsening depression.  Apparently son recently arrived from [place] to help as patient has been having some issues with housing, she currently was staying with some family members but does not get along with them very well, so seems that she has been displaced.  She states that she been having some thoughts of ending her life, but no clear plan.  She states that she does not been feeling very well and she feels that she would be better off just not being here.  Denies any thoughts of harm to anyone else.  No other complaints at this time aside from a mild headache.  States that she has not been taking her medications unclear as to why.     Physical Exam   Triage Vital Signs: ED Triage Vitals  Encounter Vitals Group     BP 04/13/24 1402 (!) 179/101     Girls Systolic BP Percentile --      Girls Diastolic BP Percentile --      Boys Systolic BP Percentile --      Boys Diastolic BP Percentile --      Pulse Rate 04/13/24 1402 (!) 102     Resp 04/13/24 1402 15     Temp 04/13/24 1402 98.3 F (36.8 C)     Temp Source 04/13/24 1402 Oral     SpO2 04/13/24 1402 100 %     Weight 04/13/24 1403 170 lb (77.1 kg)     Height 04/13/24 1403 5' 3 (1.6 m)     Head Circumference --      Peak Flow --      Pain Score 04/13/24 1431 0     Pain Loc --      Pain Education --      Exclude from Growth Chart --     Most recent vital signs: Vitals:   04/13/24 1402  BP: (!) 179/101  Pulse: (!) 102  Resp: 15  Temp: 98.3 F (36.8 C)  SpO2: 100%     General: Awake, no distress.  CV:  Good peripheral perfusion.  Resp:  Normal effort.  Abd:  No distention.  Other:     ED Results / Procedures / Treatments   Labs (all labs ordered are listed, but  only abnormal results are displayed) Labs Reviewed  COMPREHENSIVE METABOLIC PANEL WITH GFR - Abnormal; Notable for the following components:      Result Value   Glucose, Bld 323 (*)    Creatinine, Ser 1.05 (*)    GFR, Estimated 57 (*)    All other components within normal limits  URINALYSIS, ROUTINE W REFLEX MICROSCOPIC - Abnormal; Notable for the following components:   Color, Urine YELLOW (*)    APPearance CLEAR (*)    Glucose, UA >=500 (*)    Protein, ur 100 (*)    All other components within normal limits  CBG MONITORING, ED - Abnormal; Notable for the following components:   Glucose-Capillary 216 (*)    All other components within normal limits  RESP PANEL BY RT-PCR (RSV, FLU A&B, COVID)  [id number]  ETHANOL  CBC  URINE DRUG SCREEN     EKG     RADIOLOGY   PROCEDURES:  Critical Care  performed: No  Procedures   MEDICATIONS ORDERED IN ED: Medications  acetaminophen  (TYLENOL ) tablet 975 mg (975 mg Oral Not Given 04/13/24 1600)  amLODipine  (NORVASC ) tablet 10 mg (10 mg Oral Given 04/13/24 1654)  atorvastatin  (LIPITOR) tablet 40 mg (40 mg Oral Given 04/13/24 1654)  empagliflozin  (JARDIANCE ) tablet 25 mg (has no administration in time range)  lisinopril  (ZESTRIL ) tablet 40 mg (40 mg Oral Given 04/13/24 1654)  pantoprazole  (PROTONIX ) EC tablet 40 mg (40 mg Oral Given 04/13/24 1653)  insulin  aspart (novoLOG ) injection 0-15 Units (5 Units Subcutaneous Given 04/13/24 1733)     IMPRESSION / MDM / ASSESSMENT AND PLAN / ED COURSE  I reviewed the triage vital signs and the nursing notes.                               Patient's presentation is most consistent with exacerbation of chronic illness.  68 year old female with history of depression but also diabetes and chronic kidney disease presenting today with concern of worsening depression, as well as not taking her home medications and issues with housing.  She appears well she is not in any acute distress at arrival here  hypertensive.  Given her presentation, will obtain blood work and then have evaluation by psychiatry for further workup and evaluation.  The patient has been placed in psychiatric observation due to the need to provide a safe environment for the patient while obtaining psychiatric consultation and evaluation, as well as ongoing medical and medication management to treat the patient's condition.  The patient has not been placed under full IVC at this time.        FINAL CLINICAL IMPRESSION(S) / ED DIAGNOSES   Final diagnoses:  Suicidal ideation     Rx / DC Orders   ED Discharge Orders     None        Note:  This document was prepared using Dragon voice recognition software and may include unintentional dictation errors.   Fernand Rossie HERO, MD 04/13/24 2014  "

## 2024-04-14 ENCOUNTER — Inpatient Hospital Stay
Admission: AD | Admit: 2024-04-14 | Discharge: 2024-04-22 | DRG: 885 | Disposition: A | Discharge status: Home / Self Care | Source: Intra-hospital | Attending: Child & Adolescent Psychiatry | Admitting: Child & Adolescent Psychiatry

## 2024-04-14 DIAGNOSIS — Z820 Family history of epilepsy and other diseases of the nervous system: Secondary | ICD-10-CM

## 2024-04-14 DIAGNOSIS — Z59869 Financial insecurity, unspecified: Secondary | ICD-10-CM

## 2024-04-14 DIAGNOSIS — K219 Gastro-esophageal reflux disease without esophagitis: Secondary | ICD-10-CM | POA: Diagnosis present

## 2024-04-14 DIAGNOSIS — N189 Chronic kidney disease, unspecified: Secondary | ICD-10-CM | POA: Diagnosis present

## 2024-04-14 DIAGNOSIS — Z91148 Patient's other noncompliance with medication regimen for other reason: Secondary | ICD-10-CM | POA: Diagnosis not present

## 2024-04-14 DIAGNOSIS — R45851 Suicidal ideations: Secondary | ICD-10-CM | POA: Diagnosis present

## 2024-04-14 DIAGNOSIS — Z8249 Family history of ischemic heart disease and other diseases of the circulatory system: Secondary | ICD-10-CM

## 2024-04-14 DIAGNOSIS — F419 Anxiety disorder, unspecified: Secondary | ICD-10-CM | POA: Diagnosis present

## 2024-04-14 DIAGNOSIS — Z7984 Long term (current) use of oral hypoglycemic drugs: Secondary | ICD-10-CM

## 2024-04-14 DIAGNOSIS — F0393 Unspecified dementia, unspecified severity, with mood disturbance: Secondary | ICD-10-CM | POA: Diagnosis present

## 2024-04-14 DIAGNOSIS — Z638 Other specified problems related to primary support group: Secondary | ICD-10-CM

## 2024-04-14 DIAGNOSIS — R519 Headache, unspecified: Secondary | ICD-10-CM | POA: Diagnosis present

## 2024-04-14 DIAGNOSIS — E1122 Type 2 diabetes mellitus with diabetic chronic kidney disease: Secondary | ICD-10-CM | POA: Diagnosis present

## 2024-04-14 DIAGNOSIS — G4700 Insomnia, unspecified: Secondary | ICD-10-CM | POA: Diagnosis present

## 2024-04-14 DIAGNOSIS — I129 Hypertensive chronic kidney disease with stage 1 through stage 4 chronic kidney disease, or unspecified chronic kidney disease: Secondary | ICD-10-CM | POA: Diagnosis present

## 2024-04-14 DIAGNOSIS — F332 Major depressive disorder, recurrent severe without psychotic features: Secondary | ICD-10-CM

## 2024-04-14 DIAGNOSIS — Z794 Long term (current) use of insulin: Secondary | ICD-10-CM

## 2024-04-14 DIAGNOSIS — Z5902 Unsheltered homelessness: Secondary | ICD-10-CM

## 2024-04-14 DIAGNOSIS — E785 Hyperlipidemia, unspecified: Secondary | ICD-10-CM | POA: Diagnosis present

## 2024-04-14 DIAGNOSIS — I493 Ventricular premature depolarization: Secondary | ICD-10-CM | POA: Diagnosis present

## 2024-04-14 DIAGNOSIS — Z7985 Long-term (current) use of injectable non-insulin antidiabetic drugs: Secondary | ICD-10-CM

## 2024-04-14 DIAGNOSIS — Z79899 Other long term (current) drug therapy: Secondary | ICD-10-CM | POA: Diagnosis not present

## 2024-04-14 DIAGNOSIS — Z818 Family history of other mental and behavioral disorders: Secondary | ICD-10-CM

## 2024-04-14 DIAGNOSIS — Z833 Family history of diabetes mellitus: Secondary | ICD-10-CM

## 2024-04-14 DIAGNOSIS — F331 Major depressive disorder, recurrent, moderate: Secondary | ICD-10-CM | POA: Diagnosis present

## 2024-04-14 LAB — GLUCOSE, CAPILLARY: Glucose-Capillary: 175 mg/dL — ABNORMAL HIGH (ref 70–99)

## 2024-04-14 LAB — CBG MONITORING, ED
Glucose-Capillary: 218 mg/dL — ABNORMAL HIGH (ref 70–99)
Glucose-Capillary: 229 mg/dL — ABNORMAL HIGH (ref 70–99)

## 2024-04-14 MED ORDER — OLANZAPINE 5 MG PO TBDP: 2.5000 mg | ORAL_TABLET | Freq: Four times a day (QID) | ORAL | Status: DC | PRN

## 2024-04-14 MED ORDER — ACETAMINOPHEN 325 MG PO TABS
650.0000 mg | ORAL_TABLET | Freq: Four times a day (QID) | ORAL | Status: DC | PRN
  Administered 2024-04-16 – 2024-04-17 (×2): 650 mg via ORAL
  Filled 2024-04-14: qty 2

## 2024-04-14 MED ORDER — LISINOPRIL 20 MG PO TABS
40.0000 mg | ORAL_TABLET | Freq: Every day | ORAL | Status: DC
  Administered 2024-04-15 – 2024-04-22 (×8): 40 mg via ORAL
  Filled 2024-04-14 (×2): qty 2

## 2024-04-14 MED ORDER — HYDROXYZINE HCL 10 MG PO TABS: 10.0000 mg | ORAL_TABLET | Freq: Four times a day (QID) | ORAL | Status: DC | PRN

## 2024-04-14 MED ORDER — MAGNESIUM HYDROXIDE 400 MG/5ML PO SUSP: 30.0000 mL | Freq: Every day | ORAL | Status: DC | PRN

## 2024-04-14 MED ORDER — INSULIN GLARGINE 100 UNIT/ML ~~LOC~~ SOLN
15.0000 [IU] | Freq: Every day | SUBCUTANEOUS | Status: DC
  Administered 2024-04-14 – 2024-04-21 (×8): 15 [IU] via SUBCUTANEOUS
  Filled 2024-04-14 (×3): qty 0.15

## 2024-04-14 MED ORDER — OLANZAPINE 10 MG IM SOLR: 5.0000 mg | Freq: Three times a day (TID) | INTRAMUSCULAR | Status: DC | PRN

## 2024-04-14 MED ORDER — ALUM & MAG HYDROXIDE-SIMETH 200-200-20 MG/5ML PO SUSP: 30.0000 mL | ORAL | Status: DC | PRN

## 2024-04-14 MED ORDER — INSULIN GLARGINE 100 UNIT/ML ~~LOC~~ SOLN
15.0000 [IU] | Freq: Every day | SUBCUTANEOUS | Status: DC
  Filled 2024-04-14: qty 0.15

## 2024-04-14 MED ORDER — OLANZAPINE 5 MG PO TBDP: 5.0000 mg | ORAL_TABLET | Freq: Three times a day (TID) | ORAL | Status: DC | PRN

## 2024-04-14 MED ORDER — ATORVASTATIN CALCIUM 10 MG PO TABS
40.0000 mg | ORAL_TABLET | Freq: Every day | ORAL | Status: DC
  Administered 2024-04-15 – 2024-04-22 (×8): 40 mg via ORAL
  Filled 2024-04-14 (×2): qty 4

## 2024-04-14 MED ORDER — ESCITALOPRAM OXALATE 10 MG PO TABS
10.0000 mg | ORAL_TABLET | Freq: Every day | ORAL | Status: DC
  Administered 2024-04-14 – 2024-04-22 (×9): 10 mg via ORAL
  Filled 2024-04-14 (×3): qty 1

## 2024-04-14 MED ORDER — INSULIN ASPART 100 UNIT/ML IJ SOLN
0.0000 [IU] | Freq: Three times a day (TID) | INTRAMUSCULAR | Status: DC
  Administered 2024-04-14: 2 [IU] via SUBCUTANEOUS
  Administered 2024-04-14: 8 [IU] via SUBCUTANEOUS
  Administered 2024-04-15: 3 [IU] via SUBCUTANEOUS
  Administered 2024-04-15: 5 [IU] via SUBCUTANEOUS
  Administered 2024-04-15 – 2024-04-16 (×4): 3 [IU] via SUBCUTANEOUS
  Administered 2024-04-17: 2 [IU] via SUBCUTANEOUS
  Administered 2024-04-17 (×2): 5 [IU] via SUBCUTANEOUS
  Administered 2024-04-18: 3 [IU] via SUBCUTANEOUS
  Administered 2024-04-18: 8 [IU] via SUBCUTANEOUS
  Administered 2024-04-18: 2 [IU] via SUBCUTANEOUS
  Administered 2024-04-19 (×2): 3 [IU] via SUBCUTANEOUS
  Administered 2024-04-20: 5 [IU] via SUBCUTANEOUS
  Administered 2024-04-20: 3 [IU] via SUBCUTANEOUS
  Administered 2024-04-20 – 2024-04-21 (×2): 8 [IU] via SUBCUTANEOUS
  Administered 2024-04-22: 2 [IU] via SUBCUTANEOUS
  Administered 2024-04-22: 3 [IU] via SUBCUTANEOUS
  Filled 2024-04-14 (×2): qty 3
  Filled 2024-04-14: qty 5
  Filled 2024-04-14: qty 8
  Filled 2024-04-14 (×2): qty 3
  Filled 2024-04-14: qty 2
  Filled 2024-04-14: qty 3

## 2024-04-14 MED ORDER — OLANZAPINE 10 MG IM SOLR: 2.5000 mg | Freq: Four times a day (QID) | INTRAMUSCULAR | Status: DC | PRN

## 2024-04-14 MED ORDER — AMLODIPINE BESYLATE 5 MG PO TABS
10.0000 mg | ORAL_TABLET | Freq: Every day | ORAL | Status: DC
  Administered 2024-04-15 – 2024-04-22 (×8): 10 mg via ORAL
  Filled 2024-04-14 (×2): qty 2

## 2024-04-14 MED ORDER — PANTOPRAZOLE SODIUM 40 MG PO TBEC
40.0000 mg | DELAYED_RELEASE_TABLET | Freq: Every day | ORAL | Status: DC
  Administered 2024-04-15 – 2024-04-22 (×8): 40 mg via ORAL
  Filled 2024-04-14 (×2): qty 1

## 2024-04-14 MED ORDER — EMPAGLIFLOZIN 25 MG PO TABS
25.0000 mg | ORAL_TABLET | Freq: Every day | ORAL | Status: DC
  Administered 2024-04-15 – 2024-04-22 (×8): 25 mg via ORAL
  Filled 2024-04-14 (×2): qty 1

## 2024-04-14 MED ORDER — INSULIN GLARGINE 100 UNIT/ML ~~LOC~~ SOLN: 15.0000 [IU] | Freq: Every day | SUBCUTANEOUS | Status: DC

## 2024-04-14 NOTE — Consult Note (Signed)
 [facility] Consult Note  Patient Name: Kelly Doyle MRN: [id number] DOB: 1955-09-22 DATE OF Consult: 04/14/2024 Consult Order details:  Orders (From admission, onward)     Start     Ordered   04/13/24 1500  CONSULT TO CALL ACT TEAM       Ordering Provider: Fernand Rossie HERO, MD  Provider:  (Not yet assigned)  Question:  Reason for Consult?  Answer:  Psych consult   04/13/24 1459   04/13/24 1500  IP CONSULT TO PSYCHIATRY       Ordering Provider: Fernand Rossie HERO, MD  Provider:  (Not yet assigned)  Question:  Reason for consult:  Answer:  Medication management   04/13/24 1459            PRIMARY PSYCHIATRIC DIAGNOSES   1.  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   RECOMMENDATIONS  Recommendations: Medication recommendations: Do not recommend that any psychotropic medications be begun in the ED.  Had been prescribed Lexapro , but unclear if she took it.  PRN's:  hydroxyzine , 10 mg q6h PRN anxiety; Zyprexa  Zydis, 2.5 mg SL q6h PRN agitation; and Zyprexa , 2.5 mg IM q6h PRN severe agitation  Non-Medication/therapeutic recommendations: Patient still with suicidal ideation, and she needs constant observation, per ED protocol, until she can be safely admitted to Psychiatry; continue with matter-of-fact emotional support in ED, pending transfer Is inpatient psychiatric hospitalization recommended for this patient? Yes (Explain why): Patient remains quite depressed and hopeless and is still expressing suicidal ideation.  Meets criteria for admission, and she is willing to sign in voluntarily Is another care setting recommended for this patient? (examples may include Crisis Stabilization Unit, Residential/Recovery Treatment, ALF/SNF, Memory Care Unit)  No (Explain why): As above From a psychiatric perspective, is this patient appropriate for discharge to an outpatient setting/resource or other less restrictive environment for continued care?  No (Explain why): as  above Follow-Up Telepsychiatry C/L services: We will sign off for now. Please re-consult our service if needed for any concerning changes in the patient's condition, discharge planning, or questions. Communication: Treatment team members (and family members if applicable) who were involved in treatment/care discussions and planning, and with whom we spoke or engaged with via secure text/chat, include the following: Secure message shared with Dr. Gordan, ED attending, and ED staff, outlining above recommendations  I personally spent a total of 30 minutes in the care of the patient today including preparing to see the patient, getting/reviewing separately obtained history, performing a medically appropriate exam/evaluation, counseling and educating, placing orders, referring and communicating with other health care professionals, and documenting clinical information in the EHR.  Thank you for involving us  in the care of this patient. If you have any additional questions or concerns, please call [phone] and ask for the provider on-call.   TELEPSYCHIATRY ATTESTATION & CONSENT   As the provider for this telehealth consult, I attest that I verified the patients identity using two separate identifiers, introduced myself to the patient, provided my credentials, disclosed my location, and performed this encounter via a HIPAA-compliant, real-time, face-to-face, two-way, interactive audio and video platform and with the full consent and agreement of the patient (or guardian as applicable.)   Patient physical location: ED, [facility]. Telehealth provider physical location: home office in state of [place] .  Video start time: 0600h EST  Video end time: 0615h EST    IDENTIFYING DATA  Kelly Doyle is a 68 y.o. year-old female for whom a psychiatric consultation has been  ordered by the primary provider. The patient was identified using two separate identifiers.  CHIEF  COMPLAINT/REASON FOR CONSULT   I have no one to help me.  My son says he will, but no one is helping me, and I don't want to live anymore.   HISTORY OF PRESENT ILLNESS (HPI)  The patient presents with a history of recurrent depression, now presenting with suicidal ideation.  Patient has become estranged from her daughter, who live locally.  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  Even there, however, she had interpersonal issues.    Earlier today she spoke with her son, who is moving from [place] to [place] to help care for patient, and she told him that she was wanting to kill herself.  Son had never heard her say anything like this, and he became quite concerned and sent for patient to be brought to the hospital for evaluation.  Here patient continues to say that she wishes to kill herself, again because nobody wants to help me.  When reminded that her son is moving here to do so, she said But he can't do it all.  I just want to die.    Patient has been prescribed antidepressants in the past (Lexapro , Zoloft , trazodone ), but unclear how long she took them.  Her mother had severe late-life depression, and she died at the [facility] in [place], [place].  Denies EtOH. Drug use, and UDS and BAL negative.  No homicidal ideation.  No psychotic sx's.   PAST PSYCHIATRIC HISTORY  As above Otherwise as per HPI above.  PAST MEDICAL HISTORY  Past Medical History:  Diagnosis Date   Anxiety    Arthritis    Dementia (HCC)    per son on 12/25   Diabetes mellitus without complication (HCC)    GERD (gastroesophageal reflux disease)    Hypertension    Please see ED Provider's H&P  HOME MEDICATIONS  Facility Ordered Medications  Medication   acetaminophen  (TYLENOL ) tablet 975 mg   amLODipine  (NORVASC ) tablet 10 mg   atorvastatin  (LIPITOR) tablet 40 mg   empagliflozin  (JARDIANCE ) tablet  25 mg   lisinopril  (ZESTRIL ) tablet 40 mg   pantoprazole  (PROTONIX ) EC tablet 40 mg   insulin  aspart (novoLOG ) injection 0-15 Units   hydrOXYzine  (ATARAX ) tablet 10 mg   OLANZapine  zydis (ZYPREXA ) disintegrating tablet 2.5 mg   OLANZapine  (ZYPREXA ) injection 2.5 mg   PTA Medications  Medication Sig   atorvastatin  (LIPITOR) 40 MG tablet Take 1 tablet (40 mg total) by mouth daily.   lisinopril  (ZESTRIL ) 40 MG tablet Take 1 tablet (40 mg total) by mouth daily.   amLODipine  (NORVASC ) 10 MG tablet Take 1 tablet (10 mg total) by mouth daily.   baclofen (LIORESAL) 10 MG tablet Take 10 mg by mouth daily as needed.   escitalopram  (LEXAPRO ) 10 MG tablet Take 10 mg by mouth daily.   LANTUS  SOLOSTAR 100 UNIT/ML Solostar Pen Inject 30 Units into the skin daily.   OZEMPIC, 0.25 OR 0.5 MG/DOSE, 2 MG/3ML SOPN Inject 0.25 mg into the skin once a week.   VICTOZA 18 MG/3ML SOPN Inject into the skin. (Patient not taking: Reported on 11/02/2023)   acetaminophen  (TYLENOL ) 650 MG CR tablet SMARTSIG:1-2 Tablet(s) By Mouth Every 8 Hours PRN (Patient not taking: Reported on 11/02/2023)   blood glucose meter kit and supplies KIT Dispense  based on patient and insurance preference. Use up to four times daily as directed.   blood glucose meter kit and supplies Dispense based on patient and insurance preference. Use up to four times daily as directed. (FOR ICD-10 E10.9, E11.9).   BD PEN NEEDLE NANO 2ND GEN 32G X 4 MM MISC 3 (three) times daily. :3 Times Daily   sertraline  (ZOLOFT ) 50 MG tablet Take 1 tablet (50 mg total) by mouth daily. (Patient not taking: Reported on 04/14/2024)   traZODone  (DESYREL ) 50 MG tablet Take 1 tablet (50 mg total) by mouth at bedtime. (Patient not taking: Reported on 04/14/2024)   insulin  lispro (HUMALOG ) 100 UNIT/ML KwikPen Inject 0-9 units into the skin 3 (three) times daily with meals and inject 0-5 units into the skin at bedtime. (Patient not taking: Reported on 04/14/2024)   insulin  aspart  (NOVOLOG ) 100 UNIT/ML injection Inject 0-9 Units into the skin 3 (three) times daily with meals. (Patient not taking: Reported on 04/14/2024)   empagliflozin  (JARDIANCE ) 25 MG TABS tablet Take 1 tablet (25 mg total) by mouth daily. (Patient not taking: Reported on 04/14/2024)   pantoprazole  (PROTONIX ) 40 MG tablet Take 1 tablet (40 mg total) by mouth daily. (Patient not taking: Reported on 04/14/2024)   Insulin  Pen Needle (INSUPEN PEN NEEDLES) 31G X 5 MM MISC Use 1 each 4 (four) times daily with insulin .     ALLERGIES  Allergies[1]  SOCIAL & SUBSTANCE USE HISTORY  Social History   Socioeconomic History   Marital status: Single    Spouse name: Not on file   Number of children: Not on file   Years of education: Not on file   Highest education level: Not on file  Occupational History   Not on file  Tobacco Use   Smoking status: Never   Smokeless tobacco: Never  Vaping Use   Vaping status: Never Used  Substance and Sexual Activity   Alcohol use: No   Drug use: No   Sexual activity: Yes    Birth control/protection: None  Other Topics Concern   Not on file  Social History Narrative   Not on file   Social Drivers of Health   Tobacco Use: Low Risk (04/13/2024)   Patient History    Smoking Tobacco Use: Never    Smokeless Tobacco Use: Never    Passive Exposure: Not on file  Financial Resource Strain: Not on file  Food Insecurity: No Food Insecurity (11/02/2023)   Epic    Worried About Programme Researcher, Broadcasting/film/video in the Last Year: Never true    Ran Out of Food in the Last Year: Never true  Transportation Needs: No Transportation Needs (11/02/2023)   Epic    Lack of Transportation (Medical): No    Lack of Transportation (Non-Medical): No  Physical Activity: Not on file  Stress: Not on file  Social Connections: Moderately Isolated (11/02/2023)   Social Connection and Isolation Panel    Frequency of Communication with Friends and Family: Three times a week    Frequency of Social  Gatherings with Friends and Family: More than three times a week    Attends Religious Services: 1 to 4 times per year    Active Member of Clubs or Organizations: No    Attends Banker Meetings: Never    Marital Status: Divorced  Depression (PHQ2-9): Not on file  Alcohol Screen: Low Risk (11/02/2023)   Alcohol Screen    Last Alcohol Screening Score (AUDIT): 0  Housing: High Risk (11/02/2023)  Epic    Unable to Pay for Housing in the Last Year: No    Number of Times Moved in the Last Year: 4    Homeless in the Last Year: Yes  Utilities: Not At Risk (11/02/2023)   Epic    Threatened with loss of utilities: No  Health Literacy: Not on file   Tobacco Use History[2] Social History   Substance and Sexual Activity  Alcohol Use No   Social History   Substance and Sexual Activity  Drug Use No    .  FAMILY HISTORY  Family History  Problem Relation Age of Onset   Alzheimer's disease Mother    Hypertension Mother    Diabetes Sister    Gout Brother    Family Psychiatric History (if known):  As above.  Mother died at [facility].   MENTAL STATUS EXAM (MSE)  Mental Status Exam: General Appearance: Disheveled  Orientation:  Full (Time, Place, and Person)  Memory:  Immediate;   Fair Recent;   Fair Remote;   Fair  Concentration:  Concentration: Fair and Attention Span: Fair  Recall:  Fair  Attention  Fair  Eye Contact:  Minimal  Speech:  Slow  Language:  Good  Volume:  Decreased  Mood: I don't want to live anymore  Affect:  Depressed and Flat  Thought Process:  Descriptions of Associations: Circumstantial  Thought Content:  Rumination  Suicidal Thoughts:  Yes.  with intent/plan  Homicidal Thoughts:  No  Judgement:  Impaired  Insight:  Lacking  Psychomotor Activity:  Psychomotor Retardation  Akathisia:  No  Fund of Knowledge:  Fair    Assets:  Communication Skills Desire for Improvement  Cognition:  WNL  ADL's:  Impaired  AIMS (if indicated):       VITALS   Blood pressure (!) 108/59, pulse 87, temperature 97.9 F (36.6 C), temperature source Oral, resp. rate 18, height 5' 3 (1.6 m), weight 77.1 kg, SpO2 100%.  LABS  Admission on 04/13/2024  Component Date Value Ref Range Status   Sodium 04/13/2024 139  135 - 145 mmol/L Final   Potassium 04/13/2024 4.5  3.5 - 5.1 mmol/L Final   Chloride 04/13/2024 100  98 - 111 mmol/L Final   CO2 04/13/2024 24  22 - 32 mmol/L Final   Glucose, Bld 04/13/2024 323 (H)  70 - 99 mg/dL Final   Glucose reference range applies only to samples taken after fasting for at least 8 hours.   BUN 04/13/2024 18  8 - 23 mg/dL Final   Creatinine, Ser 04/13/2024 1.05 (H)  0.44 - 1.00 mg/dL Final   Calcium  04/13/2024 10.0  8.9 - 10.3 mg/dL Final   Total Protein 87/74/7974 7.6  6.5 - 8.1 g/dL Final   Albumin 87/74/7974 4.4  3.5 - 5.0 g/dL Final   AST 87/74/7974 21  15 - 41 U/L Final   ALT 04/13/2024 15  0 - 44 U/L Final   Alkaline Phosphatase 04/13/2024 95  38 - 126 U/L Final   Total Bilirubin 04/13/2024 0.3  0.0 - 1.2 mg/dL Final   GFR, Estimated 04/13/2024 57 (L)  >60 mL/min Final   Comment: (NOTE) Calculated using the CKD-EPI Creatinine Equation (2021)    Anion gap 04/13/2024 14  5 - 15 Final   Performed at [facility], [address] [place], [place] [address]   Alcohol, Ethyl (B) 04/13/2024 <15  <15 mg/dL Final   Comment: (NOTE) For medical purposes only. Performed at [facility], [address] [place], [place] [address]    WBC 04/13/2024 7.6  4.0 - 10.5 K/uL Final   RBC 04/13/2024 4.27  3.87 - 5.11 MIL/uL Final   Hemoglobin 04/13/2024 12.7  12.0 - 15.0 g/dL Final   HCT 87/74/7974 39.1  36.0 - 46.0 % Final   MCV 04/13/2024 91.6  80.0 - 100.0 fL Final   MCH 04/13/2024 29.7  26.0 - 34.0 pg Final   MCHC 04/13/2024 32.5  30.0 - 36.0 g/dL Final   RDW 87/74/7974 12.3  11.5 - 15.5 % Final   Platelets 04/13/2024 248  150 - 400 K/uL Final   nRBC 04/13/2024 0.0  0.0 - 0.2 % Final   Performed  at [facility], [address] [place], [place] [address]   Opiates 04/13/2024 NEGATIVE  NEGATIVE Final   Cocaine 04/13/2024 NEGATIVE  NEGATIVE Final   Benzodiazepines 04/13/2024 NEGATIVE  NEGATIVE Final   Amphetamines 04/13/2024 NEGATIVE  NEGATIVE Final   Tetrahydrocannabinol 04/13/2024 NEGATIVE  NEGATIVE Final   Barbiturates 04/13/2024 NEGATIVE  NEGATIVE Final   Methadone Scn, Ur 04/13/2024 NEGATIVE  NEGATIVE Final   Fentanyl  04/13/2024 NEGATIVE  NEGATIVE Final   Comment: (NOTE) Drug screen is for Medical Purposes only. Positive results are preliminary only. If confirmation is needed, notify lab within 5 days.  Drug Class                 Cutoff (ng/mL) Amphetamine and metabolites 1000 Barbiturate and metabolites 200 Benzodiazepine              200 Opiates and metabolites     300 Cocaine and metabolites     300 THC                         50 Fentanyl                     5 Methadone                   300  Trazodone  is metabolized in vivo to several metabolites,  including pharmacologically active m-CPP, which is excreted in the  urine.  Immunoassay screens for amphetamines and MDMA have potential  cross-reactivity with these compounds and may provide false positive  result.  Performed at [facility], [address] [place], [place] [address]    Color, Urine 04/13/2024 YELLOW (A)  YELLOW Final   APPearance 04/13/2024 CLEAR (A)  CLEAR Final   Specific Gravity, Urine 04/13/2024 1.028  1.005 - 1.030 Final   pH 04/13/2024 5.0  5.0 - 8.0 Final   Glucose, UA 04/13/2024 >=500 (A)  NEGATIVE mg/dL Final   Hgb urine dipstick 04/13/2024 NEGATIVE  NEGATIVE Final   Bilirubin Urine 04/13/2024 NEGATIVE  NEGATIVE Final   Ketones, ur 04/13/2024 NEGATIVE  NEGATIVE mg/dL Final   Protein, ur 87/74/7974 100 (A)  NEGATIVE mg/dL Final   Nitrite 87/74/7974 NEGATIVE  NEGATIVE Final   Leukocytes,Ua 04/13/2024 NEGATIVE  NEGATIVE Final   RBC / HPF 04/13/2024 0-5  0 - 5 RBC/hpf  Final   WBC, UA 04/13/2024 0-5  0 - 5 WBC/hpf Final   Bacteria, UA 04/13/2024 NONE SEEN  NONE SEEN Final   Squamous Epithelial / HPF 04/13/2024 0-5  0 - 5 /HPF Final   Mucus 04/13/2024 PRESENT   Final   Performed at [facility], [address] [place], [place] [address]   SARS Coronavirus 2 by RT PCR 04/13/2024 NEGATIVE  NEGATIVE Final  Comment: (NOTE) SARS-CoV-2 target nucleic acids are NOT DETECTED.  The SARS-CoV-2 RNA is generally detectable in upper respiratory specimens during the acute phase of infection. The lowest concentration of SARS-CoV-2 viral copies this assay can detect is 138 copies/mL. A negative result does not preclude SARS-Cov-2 infection and should not be used as the sole basis for treatment or other patient management decisions. A negative result may occur with  improper specimen collection/handling, submission of specimen other than nasopharyngeal swab, presence of viral mutation(s) within the areas targeted by this assay, and inadequate number of viral copies(<138 copies/mL). A negative result must be combined with clinical observations, patient history, and epidemiological information. The expected result is Negative.  Fact Sheet for Patients:  bloggercourse.com  Fact Sheet for Healthcare Providers:  seriousbroker.it  This test is no                          t yet approved or cleared by the [place]  FDA and  has been authorized for detection and/or diagnosis of SARS-CoV-2 by FDA under an Emergency Use Authorization (EUA). This EUA will remain  in effect (meaning this test can be used) for the duration of the COVID-19 declaration under Section 564(b)(1) of the Act, 21 U.S.C.section 360bbb-3(b)(1), unless the authorization is terminated  or revoked sooner.       Influenza A by PCR 04/13/2024 NEGATIVE  NEGATIVE Final   Influenza B by PCR 04/13/2024 NEGATIVE  NEGATIVE Final   Comment:  (NOTE) The Xpert Xpress SARS-CoV-2/FLU/RSV plus assay is intended as an aid in the diagnosis of influenza from Nasopharyngeal swab specimens and should not be used as a sole basis for treatment. Nasal washings and aspirates are unacceptable for Xpert Xpress SARS-CoV-2/FLU/RSV testing.  Fact Sheet for Patients: bloggercourse.com  Fact Sheet for Healthcare Providers: seriousbroker.it  This test is not yet approved or cleared by the [place]  FDA and has been authorized for detection and/or diagnosis of SARS-CoV-2 by FDA under an Emergency Use Authorization (EUA). This EUA will remain in effect (meaning this test can be used) for the duration of the COVID-19 declaration under Section 564(b)(1) of the Act, 21 U.S.C. section 360bbb-3(b)(1), unless the authorization is terminated or revoked.     Resp Syncytial Virus by PCR 04/13/2024 NEGATIVE  NEGATIVE Final   Comment: (NOTE) Fact Sheet for Patients: bloggercourse.com  Fact Sheet for Healthcare Providers: seriousbroker.it  This test is not yet approved or cleared by the [place]  FDA and has been authorized for detection and/or diagnosis of SARS-CoV-2 by FDA under an Emergency Use Authorization (EUA). This EUA will remain in effect (meaning this test can be used) for the duration of the COVID-19 declaration under Section 564(b)(1) of the Act, 21 U.S.C. section 360bbb-3(b)(1), unless the authorization is terminated or revoked.  Performed at [facility], [address] [place], [place] [address]    Glucose-Capillary 04/13/2024 216 (H)  70 - 99 mg/dL Final   Glucose reference range applies only to samples taken after fasting for at least 8 hours.   Glucose-Capillary 04/13/2024 253 (H)  70 - 99 mg/dL Final   Glucose reference range applies only to samples taken after fasting for at least 8 hours.    PSYCHIATRIC  REVIEW OF SYSTEMS (ROS)  ROS: Notable for the following relevant positive findings: Review of Systems  Constitutional: Negative.   HENT: Negative.    Eyes: Negative.   Respiratory: Negative.    Cardiovascular: Negative.   Gastrointestinal: Negative.  Genitourinary: Negative.   Musculoskeletal: Negative.   Skin: Negative.   Neurological: Negative.   Endo/Heme/Allergies: Negative.   Psychiatric/Behavioral:  Positive for depression and suicidal ideas. The patient is nervous/anxious and has insomnia.     Additional findings:      Musculoskeletal: No abnormal movements observed      Gait & Station: Laying/Sitting      Pain Screening: Denies      Nutrition & Dental Concerns:  Reviewed   RISK FORMULATION/ASSESSMENT  Is the patient experiencing any suicidal or homicidal ideations: Yes       Explain if yes:   Continuing to express hopelessness and suicidal ideation  Protective factors considered for safety management:   Patient has very few resources in the community (had been living in a car)  Risk factors/concerns considered for safety management:  Depression Physical illness/chronic pain Recent loss Access to lethal means Age over 60 Hopelessness Isolation Barriers to accessing treatment Unmarried  Is there a astronomer plan with the patient and treatment team to minimize risk factors and promote protective factors: No           Explain: As above, patient with access to few resources in the community.  Is crisis care placement or psychiatric hospitalization recommended: Yes     Based on my current evaluation and risk assessment, patient is determined at this time to be at:  High risk  *RISK ASSESSMENT Risk assessment is a dynamic process; it is possible that this patient's condition, and risk level, may change. This should be re-evaluated and managed over time as appropriate. Please re-consult psychiatric consult services if additional assistance is needed in  terms of risk assessment and management. If your team decides to discharge this patient, please advise the patient how to best access emergency psychiatric services, or to call 911, if their condition worsens or they feel unsafe in any way.   Adriana JINNY Pontes, MD Telepsychiatry Consult Services    [1] No Known Allergies [2]  Social History Tobacco Use  Smoking Status Never  Smokeless Tobacco Never

## 2024-04-14 NOTE — ED Notes (Signed)
vol/psych consult ordered/pending.. 

## 2024-04-14 NOTE — ED Notes (Signed)
 9786 Glucose not performed due to patient sleeping

## 2024-04-14 NOTE — H&P (Signed)
 " Psychiatric Admission Assessment Adult  Patient Identification: Kelly Doyle MRN:  [id number] Date of Evaluation:  04/14/2024 Chief Complaint:  MDD (major depressive disorder) [F32.9]   History of Present Illness:  patient presents with a history of recurrent depression, now presenting with suicidal ideation.  Patient has become estranged from her daughter, who live locally.  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  Even there, however, she had interpersonal issues. Earlier today she spoke with her son, who is moving from [place] to [place] to help care for patient, and she told him that she was wanting to kill herself.Patient is admitted to [facility] unit with Q15 min safety monitoring. Multidisciplinary team approach is offered. Medication management; group/milieu therapy is offered.   Total Time spent with patient: 1 hour Sleep  Sleep:Sleep: Fair  Past Psychiatric History:  Psychiatric History:  Information collected from patient/chart  Past Psychiatric History: Reports a history of depression & anxiety without panic attacks, not being treated medically Psychiatric History: Does report inpatient psychiatric hospital admission in the past- [facility] July 2025  as well as depression and anxiety. Has seen a psychiatrist in the past on an outpatient basis, but does not see anyone currently. No current medications for anxiety or depression. Information collected from patient, chart review   Prev Dx/Sx: Depression, anxiety  Current Psych Provider: None Home Meds (current): Amlodipine , Atorvastatin , Glipizide , Jardiance , Lisinopril , insulin  aspart Previous Med Trials: Patient cannot recall  Therapy: None currently   Prior Psych Hospitalization: One psych hospital admission in 1973 for depression  Prior Self Harm: No previous SI or attempts Prior Violence: None   Family Psych  History: None Family Hx suicide: None   Social History:  Developmental Hx:  Educational Hx:  Occupational Hx:  Legal Hx: None Living Situation: Unstable. Was living with her grandson, but his girlfriend no longer wanted the pt there. Her daughter's house had too many people there, so was not a long-term option. Has spent some time in the park when there was no other option. Expresses that she may be able to get housing with her son, who is currently in [place] at a drug rehabilitation center. He is expected to return to the area on August 1st. She does express worry that this may not be a reliable option, and that her son may turn to drugs again.  Spiritual Hx:  Access to weapons/lethal means: Denies access to any firearms    Substance History Alcohol: None  Tobacco: None Illicit drugs: None Is the patient at risk to self? Yes.    Has the patient been a risk to self in the past 6 months? No.  Has the patient been a risk to self within the distant past? No.  Is the patient a risk to others? No.  Has the patient been a risk to others in the past 6 months? No.  Has the patient been a risk to others within the distant past? No.   Columbia Scale:  Flowsheet Row Admission (Current) from 04/14/2024 in [facility] ED from 04/13/2024 in [facility] Department at [facility] Admission (Discharged) from 11/02/2023 in [facility]INE  C-SSRS RISK CATEGORY Low Risk High Risk Error: Question 6 not populated     Past Medical History:  Past Medical History:  Diagnosis Date   Anxiety    Arthritis  Dementia [facility])    per son on 12/25   Diabetes mellitus without complication (HCC)    GERD (gastroesophageal reflux disease)    Hypertension     Past Surgical History:  Procedure Laterality Date   COLONOSCOPY WITH PROPOFOL  N/A 06/23/2017   Procedure: COLONOSCOPY WITH PROPOFOL ;  Surgeon: Toledo, Ladell POUR, MD;  Location: [facility] ENDOSCOPY;   Service: Gastroenterology;  Laterality: N/A;   DILATION AND CURETTAGE OF UTERUS  04/20/1973   NO PAST SURGERIES     TUBAL LIGATION     Family History:  Family History  Problem Relation Age of Onset   Alzheimer's disease Mother    Hypertension Mother    Diabetes Sister    Gout Brother     Social History:  Social History   Substance and Sexual Activity  Alcohol Use No     Social History   Substance and Sexual Activity  Drug Use No      Allergies:  Allergies[1] Lab Results:  Results for orders placed or performed during the hospital encounter of 04/13/24 (from the past 48 hours)  Comprehensive metabolic panel     Status: Abnormal   Collection Time: 04/13/24  3:05 PM  Result Value Ref Range   Sodium 139 135 - 145 mmol/L   Potassium 4.5 3.5 - 5.1 mmol/L   Chloride 100 98 - 111 mmol/L   CO2 24 22 - 32 mmol/L   Glucose, Bld 323 (H) 70 - 99 mg/dL    Comment: Glucose reference range applies only to samples taken after fasting for at least 8 hours.   BUN 18 8 - 23 mg/dL   Creatinine, Ser 8.94 (H) 0.44 - 1.00 mg/dL   Calcium  10.0 8.9 - 10.3 mg/dL   Total Protein 7.6 6.5 - 8.1 g/dL   Albumin 4.4 3.5 - 5.0 g/dL   AST 21 15 - 41 U/L   ALT 15 0 - 44 U/L   Alkaline Phosphatase 95 38 - 126 U/L   Total Bilirubin 0.3 0.0 - 1.2 mg/dL   GFR, Estimated 57 (L) >60 mL/min    Comment: (NOTE) Calculated using the CKD-EPI Creatinine Equation (2021)    Anion gap 14 5 - 15    Comment: Performed at [facility], [address] [place], [place] [address]  Ethanol     Status: None   Collection Time: 04/13/24  3:05 PM  Result Value Ref Range   Alcohol, Ethyl (B) <15 <15 mg/dL    Comment: (NOTE) For medical purposes only. Performed at [facility], [address] [place], [place] [address]   cbc     Status: None   Collection Time: 04/13/24  3:05 PM  Result Value Ref Range   WBC 7.6 4.0 - 10.5 K/uL   RBC 4.27 3.87 - 5.11 MIL/uL   Hemoglobin 12.7 12.0 - 15.0 g/dL    HCT 60.8 63.9 - 53.9 %   MCV 91.6 80.0 - 100.0 fL   MCH 29.7 26.0 - 34.0 pg   MCHC 32.5 30.0 - 36.0 g/dL   RDW 87.6 88.4 - 84.4 %   Platelets 248 150 - 400 K/uL   nRBC 0.0 0.0 - 0.2 %    Comment: Performed at [facility], [address], [place], [place] [address]  Urine Drug Screen     Status: None   Collection Time: 04/13/24  3:48 PM  Result Value Ref Range   Opiates NEGATIVE NEGATIVE   Cocaine NEGATIVE NEGATIVE   Benzodiazepines NEGATIVE NEGATIVE  Amphetamines NEGATIVE NEGATIVE   Tetrahydrocannabinol NEGATIVE NEGATIVE   Barbiturates NEGATIVE NEGATIVE   Methadone Scn, Ur NEGATIVE NEGATIVE   Fentanyl NEGATIVE NEGATIVE    Comment: (NOTE) Drug screen is for Medical Purposes only. Positive results are preliminary only. If confirmation is needed, notify lab within 5 days.  Drug Class                 Cutoff (ng/mL) Amphetamine and metabolites 1000 Barbiturate and metabolites 200 Benzodiazepine              200 Opiates and metabolites     300 Cocaine and metabolites     300 THC                         50 Fentanyl                    5 Methadone                   300  Trazodone  is metabolized in vivo to several metabolites,  including pharmacologically active m-CPP, which is excreted in the  urine.  Immunoassay screens for amphetamines and MDMA have potential  cross-reactivity with these compounds and may provide false positive  result.  Performed at [facility], [address] [place], [place] [address]   Urinalysis, Routine w reflex microscopic -Urine, Clean Catch     Status: Abnormal   Collection Time: 04/13/24  3:48 PM  Result Value Ref Range   Color, Urine YELLOW (A) YELLOW   APPearance CLEAR (A) CLEAR   Specific Gravity, Urine 1.028 1.005 - 1.030   pH 5.0 5.0 - 8.0   Glucose, UA >=500 (A) NEGATIVE mg/dL   Hgb urine dipstick NEGATIVE NEGATIVE   Bilirubin Urine NEGATIVE NEGATIVE   Ketones, ur NEGATIVE NEGATIVE mg/dL   Protein, ur 899 (A)  NEGATIVE mg/dL   Nitrite NEGATIVE NEGATIVE   Leukocytes,Ua NEGATIVE NEGATIVE   RBC / HPF 0-5 0 - 5 RBC/hpf   WBC, UA 0-5 0 - 5 WBC/hpf   Bacteria, UA NONE SEEN NONE SEEN   Squamous Epithelial / HPF 0-5 0 - 5 /HPF   Mucus PRESENT     Comment: Performed at [facility], [address], [place], [place] [address]  Resp panel by RT-PCR (RSV, Flu A&B, Covid) Anterior Nasal Swab     Status: None   Collection Time: 04/13/24  3:48 PM   Specimen: Anterior Nasal Swab  Result Value Ref Range   SARS Coronavirus 2 by RT PCR NEGATIVE NEGATIVE    Comment: (NOTE) SARS-CoV-2 target nucleic acids are NOT DETECTED.  The SARS-CoV-2 RNA is generally detectable in upper respiratory specimens during the acute phase of infection. The lowest concentration of SARS-CoV-2 viral copies this assay can detect is 138 copies/mL. A negative result does not preclude SARS-Cov-2 infection and should not be used as the sole basis for treatment or other patient management decisions. A negative result may occur with  improper specimen collection/handling, submission of specimen other than nasopharyngeal swab, presence of viral mutation(s) within the areas targeted by this assay, and inadequate number of viral copies(<138 copies/mL). A negative result must be combined with clinical observations, patient history, and epidemiological information. The expected result is Negative.  Fact Sheet for Patients:  bloggercourse.com  Fact Sheet for Healthcare Providers:  seriousbroker.it  This test is no t yet approved or cleared by the [place]  FDA and  has been authorized  for detection and/or diagnosis of SARS-CoV-2 by FDA under an Emergency Use Authorization (EUA). This EUA will remain  in effect (meaning this test can be used) for the duration of the COVID-19 declaration under Section 564(b)(1) of the Act, 21 U.S.C.section 360bbb-3(b)(1), unless the  authorization is terminated  or revoked sooner.       Influenza A by PCR NEGATIVE NEGATIVE   Influenza B by PCR NEGATIVE NEGATIVE    Comment: (NOTE) The Xpert Xpress SARS-CoV-2/FLU/RSV plus assay is intended as an aid in the diagnosis of influenza from Nasopharyngeal swab specimens and should not be used as a sole basis for treatment. Nasal washings and aspirates are unacceptable for Xpert Xpress SARS-CoV-2/FLU/RSV testing.  Fact Sheet for Patients: bloggercourse.com  Fact Sheet for Healthcare Providers: seriousbroker.it  This test is not yet approved or cleared by the [place]  FDA and has been authorized for detection and/or diagnosis of SARS-CoV-2 by FDA under an Emergency Use Authorization (EUA). This EUA will remain in effect (meaning this test can be used) for the duration of the COVID-19 declaration under Section 564(b)(1) of the Act, 21 U.S.C. section 360bbb-3(b)(1), unless the authorization is terminated or revoked.     Resp Syncytial Virus by PCR NEGATIVE NEGATIVE    Comment: (NOTE) Fact Sheet for Patients: bloggercourse.com  Fact Sheet for Healthcare Providers: seriousbroker.it  This test is not yet approved or cleared by the [place]  FDA and has been authorized for detection and/or diagnosis of SARS-CoV-2 by FDA under an Emergency Use Authorization (EUA). This EUA will remain in effect (meaning this test can be used) for the duration of the COVID-19 declaration under Section 564(b)(1) of the Act, 21 U.S.C. section 360bbb-3(b)(1), unless the authorization is terminated or revoked.  Performed at [facility], [address] [place], [place] [address]   CBG monitoring, ED     Status: Abnormal   Collection Time: 04/13/24  4:52 PM  Result Value Ref Range   Glucose-Capillary 216 (H) 70 - 99 mg/dL    Comment: Glucose reference range applies  only to samples taken after fasting for at least 8 hours.  CBG monitoring, ED     Status: Abnormal   Collection Time: 04/13/24 10:27 PM  Result Value Ref Range   Glucose-Capillary 253 (H) 70 - 99 mg/dL    Comment: Glucose reference range applies only to samples taken after fasting for at least 8 hours.  CBG monitoring, ED     Status: Abnormal   Collection Time: 04/14/24  6:00 AM  Result Value Ref Range   Glucose-Capillary 218 (H) 70 - 99 mg/dL    Comment: Glucose reference range applies only to samples taken after fasting for at least 8 hours.  CBG monitoring, ED     Status: Abnormal   Collection Time: 04/14/24  8:14 AM  Result Value Ref Range   Glucose-Capillary 229 (H) 70 - 99 mg/dL    Comment: Glucose reference range applies only to samples taken after fasting for at least 8 hours.   Comment 1 Notify RN     Blood Alcohol level:  Lab Results  Component Value Date   [facility] <15 04/13/2024   ETH <15 11/01/2023    Metabolic Disorder Labs:  Lab Results  Component Value Date   HGBA1C 6.6 (H) 11/01/2023   MPG 143 11/01/2023   MPG 194.38 04/28/2021   No results found for: PROLACTIN Lab Results  Component Value Date   CHOL 143 04/28/2021   TRIG 71 04/28/2021   HDL  62 04/28/2021   CHOLHDL 2.3 04/28/2021   VLDL 14 04/28/2021   LDLCALC 67 04/28/2021   LDLCALC 73 07/09/2016    Current Medications: Current Facility-Administered Medications  Medication Dose Route Frequency Provider Last Rate Last Admin   acetaminophen  (TYLENOL ) tablet 650 mg  650 mg Oral Q6H PRN Smith, Annie B, NP       alum & mag hydroxide-simeth (MAALOX/MYLANTA) 200-200-20 MG/5ML suspension 30 mL  30 mL Oral Q4H PRN Smith, Annie B, NP       [START ON 04/15/2024] amLODipine  (NORVASC ) tablet 10 mg  10 mg Oral Daily Smith, Annie B, NP       [START ON 04/15/2024] atorvastatin  (LIPITOR) tablet 40 mg  40 mg Oral Daily Smith, Annie B, NP       [START ON 04/15/2024] empagliflozin  (JARDIANCE ) tablet 25 mg  25 mg Oral  Daily Smith, Annie B, NP       escitalopram  (LEXAPRO ) tablet 10 mg  10 mg Oral Daily Smith, Annie B, NP   10 mg at 04/14/24 1159   insulin  aspart (novoLOG ) injection 0-15 Units  0-15 Units Subcutaneous TID WC Smith, Annie B, NP   8 Units at 04/14/24 1159   insulin  glargine (LANTUS ) injection 15 Units  15 Units Subcutaneous QHS Donnelly Mellow, MD       [START ON 04/15/2024] lisinopril  (ZESTRIL ) tablet 40 mg  40 mg Oral Daily Smith, Annie B, NP       magnesium  hydroxide (MILK OF MAGNESIA) suspension 30 mL  30 mL Oral Daily PRN Smith, Annie B, NP       OLANZapine  (ZYPREXA ) injection 5 mg  5 mg Intramuscular TID PRN Smith, Annie B, NP       OLANZapine  zydis (ZYPREXA ) disintegrating tablet 5 mg  5 mg Oral TID PRN Smith, Annie B, NP       [START ON 04/15/2024] pantoprazole  (PROTONIX ) EC tablet 40 mg  40 mg Oral Daily Smith, Annie B, NP       PTA Medications: Medications Prior to Admission  Medication Sig Dispense Refill Last Dose/Taking   acetaminophen  (TYLENOL ) 650 MG CR tablet SMARTSIG:1-2 Tablet(s) By Mouth Every 8 Hours PRN (Patient not taking: Reported on 11/02/2023)      amLODipine  (NORVASC ) 10 MG tablet Take 1 tablet (10 mg total) by mouth daily. 30 tablet 2    atorvastatin  (LIPITOR) 40 MG tablet Take 1 tablet (40 mg total) by mouth daily. 90 tablet 1    baclofen (LIORESAL) 10 MG tablet Take 10 mg by mouth daily as needed.      BD PEN NEEDLE NANO 2ND GEN 32G X 4 MM MISC 3 (three) times daily. :3 Times Daily      blood glucose meter kit and supplies KIT Dispense based on patient and insurance preference. Use up to four times daily as directed. 1 each 0    blood glucose meter kit and supplies Dispense based on patient and insurance preference. Use up to four times daily as directed. (FOR ICD-10 E10.9, E11.9). 1 each 0    empagliflozin  (JARDIANCE ) 25 MG TABS tablet Take 1 tablet (25 mg total) by mouth daily. (Patient not taking: Reported on 04/14/2024) 30 tablet 0    escitalopram  (LEXAPRO ) 10 MG  tablet Take 10 mg by mouth daily.      insulin  lispro (HUMALOG ) 100 UNIT/ML KwikPen Inject 0-9 units into the skin 3 (three) times daily with meals and inject 0-5 units into the skin at bedtime. (Patient not taking: Reported on 04/14/2024) 15  mL 11    insulin  aspart (NOVOLOG ) 100 UNIT/ML injection Inject 0-9 Units into the skin 3 (three) times daily with meals. (Patient not taking: Reported on 04/14/2024) 10 mL 11    Insulin  Pen Needle (INSUPEN PEN NEEDLES) 31G X 5 MM MISC Use 1 each 4 (four) times daily with insulin . 100 each 0    LANTUS  SOLOSTAR 100 UNIT/ML Solostar Pen Inject 30 Units into the skin daily.      lisinopril  (ZESTRIL ) 40 MG tablet Take 1 tablet (40 mg total) by mouth daily. 30 tablet 2    OZEMPIC, 0.25 OR 0.5 MG/DOSE, 2 MG/3ML SOPN Inject 0.25 mg into the skin once a week.      pantoprazole  (PROTONIX ) 40 MG tablet Take 1 tablet (40 mg total) by mouth daily. (Patient not taking: Reported on 04/14/2024) 30 tablet 0    sertraline  (ZOLOFT ) 50 MG tablet Take 1 tablet (50 mg total) by mouth daily. (Patient not taking: Reported on 04/14/2024) 30 tablet 0    traZODone  (DESYREL ) 50 MG tablet Take 1 tablet (50 mg total) by mouth at bedtime. (Patient not taking: Reported on 04/14/2024) 30 tablet 0    VICTOZA 18 MG/3ML SOPN Inject into the skin. (Patient not taking: Reported on 11/02/2023)       Psychiatric Specialty Exam:  Presentation  General Appearance:  Appropriate for Environment  Eye Contact: Fair  Speech: Clear and Coherent  Speech Volume: Normal    Mood and Affect  Mood: Depressed; Dysphoric  Affect: Appropriate   Thought Process  Thought Processes: Coherent  Descriptions of Associations:Intact  Orientation:Full (Time, Place and Person)  Thought Content:Logical  Hallucinations:Hallucinations: None  Ideas of Reference:None  Suicidal Thoughts:Suicidal Thoughts: Yes, Passive  Homicidal Thoughts:Homicidal Thoughts: No   Sensorium  Memory: Immediate  Fair  Judgment: Impaired  Insight: Shallow   Executive Functions  Concentration: Fair  Attention Span: Fair  Recall: Fair  Fund of Knowledge: Fair  Language: Fair   Psychomotor Activity  Psychomotor Activity: Psychomotor Activity: Normal   Assets  Assets: Communication Skills; Desire for Improvement    Musculoskeletal: Strength & Muscle Tone: within normal limits Gait & Station: normal  Physical Exam: Physical Exam Vitals and nursing note reviewed.  HENT:     Head: Normocephalic.     Nose: Nose normal.     Mouth/Throat:     Mouth: Mucous membranes are moist.  Cardiovascular:     Rate and Rhythm: Normal rate.  Pulmonary:     Effort: Pulmonary effort is normal.  Abdominal:     General: Bowel sounds are normal.  Neurological:     Mental Status: She is alert.    Review of Systems  Constitutional: Negative.   HENT: Negative.    Eyes: Negative.   Respiratory: Negative.    Cardiovascular: Negative.   Skin: Negative.    Blood pressure 133/76, pulse 97, temperature 98.1 F (36.7 C), temperature source Temporal, resp. rate 18, height 5' 2 (1.575 m), weight 68.5 kg, SpO2 100%. Body mass index is 27.62 kg/m.  Principal Diagnosis: <principal problem not specified> Diagnosis:  Active Problems:   MDD (major depressive disorder), recurrent episode, moderate (HCC)   Clinical Decision Making:  Treatment Plan Summary:  Safety and Monitoring:             -- Voluntary admission to inpatient psychiatric unit for safety, stabilization and treatment             -- Daily contact with patient to assess and evaluate symptoms and progress in  treatment             -- Patient's case to be discussed in multi-disciplinary team meeting             -- Observation Level: q15 minute checks             -- Vital signs:  q12 hours             -- Precautions: suicide, elopement, and assault   2. Psychiatric Diagnoses and Treatment:              Lexapro  10 mg daily      -- The risks/benefits/side-effects/alternatives to this medication were discussed in detail with the patient and time was given for questions. The patient consents to medication trial.                -- Metabolic profile and EKG monitoring obtained while on an atypical antipsychotic (BMI: Lipid Panel: HbgA1c: QTc:)              -- Encouraged patient to participate in unit milieu and in scheduled group therapies                            3. Medical Issues Being Addressed:      4. Discharge Planning:              -- Social work and case management to assist with discharge planning and identification of hospital follow-up needs prior to discharge             -- Estimated LOS: 5-7 days             -- Discharge Concerns: Need to establish a safety plan; Medication compliance and effectiveness             -- Discharge Goals: Return home with outpatient referrals follow ups  Physician Treatment Plan for Primary Diagnosis: <principal problem not specified> Long Term Goal(s): Improvement in symptoms so as ready for discharge  Short Term Goals: Ability to identify changes in lifestyle to reduce recurrence of condition will improve, Ability to verbalize feelings will improve, Ability to disclose and discuss suicidal ideas, Ability to demonstrate self-control will improve, and Ability to identify and develop effective coping behaviors will improve  Physician Treatment Plan for Secondary Diagnosis: Active Problems:   MDD (major depressive disorder), recurrent episode, moderate (HCC)  Long Term Goal(s): Improvement in symptoms so as ready for discharge  Short Term Goals: Ability to identify changes in lifestyle to reduce recurrence of condition will improve, Ability to verbalize feelings will improve, Ability to disclose and discuss suicidal ideas, Ability to demonstrate self-control will improve, Ability to identify and develop effective coping behaviors will improve, and Ability to identify  triggers associated with substance abuse/mental health issues will improve  I certify that inpatient services furnished can reasonably be expected to improve the patient's condition.    Taisei Bonnette, MD 12/26/202512:06 PM    [1] No Known Allergies  "

## 2024-04-14 NOTE — Inpatient Diabetes Management (Addendum)
 Inpatient Diabetes Program Recommendations  AACE/ADA: New Consensus Statement on Inpatient Glycemic Control (2015)  Target Ranges:  Prepandial:   less than 140 mg/dL      Peak postprandial:   less than 180 mg/dL (1-2 hours)      Critically ill patients:  140 - 180 mg/dL    Latest Reference Range & Units 04/13/24 16:52 04/13/24 22:27 04/14/24 06:00  Glucose-Capillary 70 - 99 mg/dL 783 (H)  5 units Novolog   253 (H) 218 (H)  (H): Data is abnormally high   Admit with: Suicidal/ Worsening Depression  History: DM2, CKD  Home DM Meds: Lantus  30 units daily       Ozempic 0.25 mg Qweek  Current Orders: Novolog  Moderate Correction Scale/ SSI (0-15 units) TID AC      Jardiance  25 mg daily    MD- Note pt takes Lantus  daily at home  Please consider starting 50% home dose: Lantus  15 units daily     --Will follow patient during hospitalization--  Adina Rudolpho Arrow RN, MSN, CDCES Diabetes Coordinator Inpatient Glycemic Control Team Team Pager: [phone] (8a-5p)

## 2024-04-14 NOTE — ED Notes (Signed)
 CBGL 218 mg/dl. Denies any complaints at this time. Skin pink/warm/dry.

## 2024-04-14 NOTE — Plan of Care (Signed)
 New admission  Problem: Education: Goal: Utilization of techniques to improve thought processes will improve Outcome: Not Progressing Goal: Knowledge of the prescribed therapeutic regimen will improve Outcome: Not Progressing   Problem: Activity: Goal: Interest or engagement in leisure activities will improve Outcome: Not Progressing

## 2024-04-14 NOTE — Group Note (Signed)
 Date:  04/14/2024 Time:  10:37 AM  Group Topic/Focus:  Movement Therapy, Morning Stretch with Greenlee Ancheta.    Participation Level:  Did Not Attend  Norleen SHAUNNA Bias 04/14/2024, 10:37 AM

## 2024-04-14 NOTE — Group Note (Signed)
 Date:  04/14/2024 Time:  3:46 PM  Group Topic/Focus:  Meditation Therapy    Participation Level:  Did Not Attend    Kelly Doyle 04/14/2024, 3:46 PM

## 2024-04-14 NOTE — Group Note (Deleted)
 Date:  04/14/2024 Time:  8:47 PM  Group Topic/Focus:  Overcoming Stress:   The focus of this group is to define stress and help patients assess their triggers.     Participation Level:  {BHH PARTICIPATION OZCZO:77735}  Participation Quality:  {BHH PARTICIPATION QUALITY:22265}  Affect:  {BHH AFFECT:22266}  Cognitive:  {BHH COGNITIVE:22267}  Insight: {BHH Insight2:20797}  Engagement in Group:  {BHH ENGAGEMENT IN HMNLE:77731}  Modes of Intervention:  {BHH MODES OF INTERVENTION:22269}  Additional Comments:  Kelly Doyle 04/14/2024, 8:47 PM

## 2024-04-14 NOTE — Progress Notes (Signed)
 Admission Note:  Kelly Doyle was admitted voluntarily from [facility] emergency department.  Patient was brought to the hospital by her son for endorsing SI, not taking prescribed medications or eating.  Patient is currently un housed.  Kelly Doyle was pleasant and cooperative with admission process.  Alert and oriented x 4.  Endorses SI without a plan or intent, anxiety and depression.  Denies HI/AVH.  Denies pain. Patient reported she does not take her home medications, but denied any financial or transportation issues.    Skin assessment completed with Devere, RN.  No contraband found.  Patient was given reading glasses on the unit.   No recent falls reported.  Patient denies drinking alcohol or smoking cigarettes.    Patient oriented to unit.  Questions and concerns addressed. Patient resting in room at this time.

## 2024-04-14 NOTE — ED Notes (Signed)
 This RN gave report to Amy, CHARITY FUNDRAISER at [employer]

## 2024-04-14 NOTE — Group Note (Signed)
 Date:  04/14/2024 Time:  9:28 PM  Group Topic/Focus:  Wrap-Up Group:   The focus of this group is to help patients review their daily goal of treatment and discuss progress on daily workbooks.    Participation Level:  Active  Participation Quality:  Appropriate  Affect:  Appropriate  Cognitive:  Alert  Insight: Appropriate  Engagement in Group:  Engaged  Modes of Intervention:  Discussion  Additional Comments:    Kelly Doyle [name] 04/14/2024, 9:28 PM

## 2024-04-14 NOTE — ED Notes (Addendum)
 This EDT rounded on this pt. No needs at this time.

## 2024-04-14 NOTE — BHH Suicide Risk Assessment (Signed)
 [facility] Admission Suicide Risk Assessment   Nursing information obtained from:  Patient Demographic factors:  Age 68 or older, Living alone, Unemployed Current Mental Status:  Suicidal ideation indicated by patient (No plan or intent) Loss Factors:  NA Historical Factors:  NA Risk Reduction Factors:  Positive therapeutic relationship  Total Time spent with patient: 30 minutes Principal Problem: MDD (major depressive disorder) Diagnosis:  Principal Problem:   MDD (major depressive disorder)  Subjective Data:  patient presents with a history of recurrent depression, now presenting with suicidal ideation.  Patient has become estranged from her daughter, who live locally.  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  Even there, however, she had interpersonal issues. Earlier today she spoke with her son, who is moving from [place] to [place] to help care for patient, and she told him that she was wanting to kill herself.Patient is admitted to [facility] unit with Q15 min safety monitoring. Multidisciplinary team approach is offered. Medication management; group/milieu therapy is offered.   Continued Clinical Symptoms:  Alcohol Use Disorder Identification Test Final Score (AUDIT): 0 The Alcohol Use Disorders Identification Test, Guidelines for Use in Primary Care, Second Edition.  World Science Writer [facility]). Score between 0-7:  no or low risk or alcohol related problems. Score between 8-15:  moderate risk of alcohol related problems. Score between 16-19:  high risk of alcohol related problems. Score 20 or above:  warrants further diagnostic evaluation for alcohol dependence and treatment.   CLINICAL FACTORS:   Depression:   Hopelessness   Musculoskeletal: Strength & Muscle Tone: within normal limits Gait & Station: normal Patient leans: N/A  Psychiatric Specialty  Exam:  Presentation  General Appearance:  Appropriate for Environment  Eye Contact: Fair  Speech: Clear and Coherent  Speech Volume: Normal  Handedness: Right   Mood and Affect  Mood: Depressed; Dysphoric  Affect: Appropriate   Thought Process  Thought Processes: Coherent  Descriptions of Associations:Intact  Orientation:Full (Time, Place and Person)  Thought Content:Logical  History of Schizophrenia/Schizoaffective disorder:No  Duration of Psychotic Symptoms:No data recorded Hallucinations:Hallucinations: None  Ideas of Reference:None  Suicidal Thoughts:Suicidal Thoughts: Yes, Passive  Homicidal Thoughts:Homicidal Thoughts: No   Sensorium  Memory: Immediate Fair  Judgment: Impaired  Insight: Shallow   Executive Functions  Concentration: Fair  Attention Span: Fair  Recall: Fair  Fund of Knowledge: Fair  Language: Fair   Psychomotor Activity  Psychomotor Activity: Psychomotor Activity: Normal   Assets  Assets: Communication Skills; Desire for Improvement   Sleep  Sleep: Sleep: Fair    Physical Exam: Physical Exam ROS Blood pressure 133/76, pulse 97, temperature 98.1 F (36.7 C), temperature source Temporal, resp. rate 18, height 5' 2 (1.575 m), weight 68.5 kg, SpO2 100%. Body mass index is 27.62 kg/m.   COGNITIVE FEATURES THAT CONTRIBUTE TO RISK:  None    SUICIDE RISK:   Minimal: No identifiable suicidal ideation.  Patients presenting with no risk factors but with morbid ruminations; may be classified as minimal risk based on the severity of the depressive symptoms  PLAN OF CARE: Patient is admitted to [facility] psych unit with Q15 min safety monitoring. Multidisciplinary team approach is offered. Medication management; group/milieu therapy is offered.   I certify that inpatient services furnished can reasonably be expected to improve the patient's condition.   Allyn Foil, MD 04/14/2024, 12:00 PM

## 2024-04-14 NOTE — ED Provider Notes (Addendum)
----------------------------------------- °  6:01 AM on 04/14/2024 -----------------------------------------  Patient was evaluated by psychiatry and the recommendation is for inpatient behavioral medicine treatment   ED ECG REPORT I, Darleene Dome, the attending physician, personally viewed and interpreted this ECG.  Date: 04/14/2024 EKG Time: 6:31 AM Rate: 94 Rhythm: normal sinus rhythm with occasional PVC QRS Axis: normal Intervals: normal ST/T Wave abnormalities: normal Narrative Interpretation: no evidence of acute ischemia    Dome Darleene, MD 04/14/24 0710

## 2024-04-15 DIAGNOSIS — F331 Major depressive disorder, recurrent, moderate: Secondary | ICD-10-CM | POA: Diagnosis not present

## 2024-04-15 LAB — LIPID PANEL
Cholesterol: 199 mg/dL (ref 0–200)
HDL: 64 mg/dL
LDL Cholesterol: 111 mg/dL — ABNORMAL HIGH (ref 0–99)
Total CHOL/HDL Ratio: 3.1 ratio
Triglycerides: 123 mg/dL
VLDL: 25 mg/dL (ref 0–40)

## 2024-04-15 LAB — GLUCOSE, CAPILLARY
Glucose-Capillary: 195 mg/dL — ABNORMAL HIGH (ref 70–99)
Glucose-Capillary: 197 mg/dL — ABNORMAL HIGH (ref 70–99)
Glucose-Capillary: 223 mg/dL — ABNORMAL HIGH (ref 70–99)

## 2024-04-15 NOTE — BHH Counselor (Signed)
 The patient expressed that she and her son was looking for housing during today's assessment.  The LCSWA provided the patient with a list of income based housing, shelters, and boarding houses.    Kelly Doyle

## 2024-04-15 NOTE — Group Note (Signed)
 LCSW Group Therapy Note   Group Date: 04/15/2024 Start Time: 1400 End Time: 1430   Type of Therapy and Topic:  Group Therapy: Managing Intrusive Thoughts  Participation Level:  Did Not Attend  Description of Group: The purpose of this group is to assist patients in learning how to regulate negative intrusive thoughts.  Intrusive thoughts are unwanted thoughts or vivid images that suddenly enter your mind.  They may be disturbing to the person experiencing them and may trigger feelings like anxiety, shame, or disgust.  Emphasis will be placed on coping with negative intrusive thoughts in situations and using positive strategies to combat negative intrusive thoughts and using positive strategies combat negative thoughts.  Therapeutic Goals:  1. Patient will identify the difference of an inconsequential thought vs a urgent thought. 2.Patient will discuss when they need to seek assistance with negative intrusive thoughts.     Summary of Patient Progress:    Patient did not attend group.  Therapeutic Modalities:  Cognitive Behavioral Therapy Dialectical Behavior Therapy  Shoua Ulloa W Lavarius Doughten, LCSWA 04/15/2024  3:45 PM

## 2024-04-15 NOTE — Progress Notes (Signed)
 [facility] MD Progress Note  04/15/2024 10:06 PM Kelly Doyle  MRN:  [id number] Chief Complaint:  MDD (major depressive disorder) [F32.9]   patient presents with a history of recurrent depression, now presenting with suicidal ideation.  Patient has become estranged from her daughter, who live locally.  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  Even there, however, she had interpersonal issues. Earlier today she spoke with her son, who is moving from [place] to [place] to help care for patient, and she told him that she was wanting to kill herself.Patient is admitted to [facility] unit with Q15 min safety monitoring. Multidisciplinary team approach is offered. Medication management; group/milieu therapy is offered.   Subjective:   The patient reported feeling depressed for a long time, with additional experiences of aches and pains. The depression is attributed mostly to her current homeless situation. The patient stated she has been homeless for over a week, sleeping in her cousin's car, and has been moving from one relative to another. She clarified that her sister did not want her to stay at her house and that she had been with another family member before attempting to stay with her sister. The patient reported no suicidal thoughts or attempts and mentioned a history of depression dating back to her early adulthood. She stated she has been hospitalized for psychiatric reasons three times, including a previous admission in July for similar reasons. The patient is diabetic and currently on Lexapro , with no reported side effects. She expressed a desire to leave the hospital but is uncertain about future housing plans. Pertinent negatives: The patient reports not having any manic or hypomanic episodes and is not currently experiencing side effects from her medication.  Principal Problem:  <principal problem not specified> Diagnosis: Active Problems:   MDD (major depressive disorder), recurrent episode, moderate (HCC)   Past Psychiatric History: se hpi  Past Medical History:  Past Medical History:  Diagnosis Date   Anxiety    Arthritis    Dementia (HCC)    per son on 12/25   Diabetes mellitus without complication (HCC)    GERD (gastroesophageal reflux disease)    Hypertension     Past Surgical History:  Procedure Laterality Date   COLONOSCOPY WITH PROPOFOL  N/A 06/23/2017   Procedure: COLONOSCOPY WITH PROPOFOL ;  Surgeon: Toledo, Ladell MARLA, MD;  Location: [facility] ENDOSCOPY;  Service: Gastroenterology;  Laterality: N/A;   DILATION AND CURETTAGE OF UTERUS  04/20/1973   NO PAST SURGERIES     TUBAL LIGATION     Family History:  Family History  Problem Relation Age of Onset   Alzheimer's disease Mother    Hypertension Mother    Diabetes Sister    Gout Brother     Social History:  Social History   Substance and Sexual Activity  Alcohol Use No     Social History   Substance and Sexual Activity  Drug Use No    Social History   Socioeconomic History   Marital status: Single    Spouse name: Not on file   Number of children: Not on file   Years of education: Not on file   Highest education level: Not on file  Occupational History   Not on file  Tobacco Use   Smoking status: Never   Smokeless tobacco: Never  Vaping Use   Vaping status: Never Used  Substance and Sexual Activity   Alcohol use: No   Drug use: No   Sexual activity: Yes    Birth control/protection: None  Other Topics Concern   Not on file  Social History Narrative   Not on file   Social Drivers of Health   Tobacco Use: Low Risk (04/14/2024)   Patient History    Smoking Tobacco Use: Never    Smokeless Tobacco Use: Never    Passive Exposure: Not on file  Financial Resource Strain: Not on file  Food Insecurity: No Food Insecurity (04/14/2024)   Epic    Worried About Brewing Technologist in the Last Year: Never true    Ran Out of Food in the Last Year: Never true  Transportation Needs: No Transportation Needs (04/14/2024)   Epic    Lack of Transportation (Medical): No    Lack of Transportation (Non-Medical): No  Physical Activity: Not on file  Stress: Not on file  Social Connections: Socially Isolated (04/14/2024)   Social Connection and Isolation Panel    Frequency of Communication with Friends and Family: Once a week    Frequency of Social Gatherings with Friends and Family: Never    Attends Religious Services: Never    Database Administrator or Organizations: Yes    Attends Banker Meetings: Never    Marital Status: Never married  Depression (PHQ2-9): Not on file  Alcohol Screen: Low Risk (04/14/2024)   Alcohol Screen    Last Alcohol Screening Score (AUDIT): 0  Housing: High Risk (04/14/2024)   Epic    Unable to Pay for Housing in the Last Year: No    Number of Times Moved in the Last Year: 2    Homeless in the Last Year: Yes  Utilities: Not At Risk (04/14/2024)   Epic    Threatened with loss of utilities: No  Health Literacy: Not on file   Additional Social History:                         Sleep: Fair Estimated Sleeping Duration (Last 24 Hours): 11.00-13.50 hours  Appetite:  Fair  Current Medications: Current Facility-Administered Medications  Medication Dose Route Frequency Provider Last Rate Last Admin   acetaminophen  (TYLENOL ) tablet 650 mg  650 mg Oral Q6H PRN Smith, Annie B, NP       alum & mag hydroxide-simeth (MAALOX/MYLANTA) 200-200-20 MG/5ML suspension 30 mL  30 mL Oral Q4H PRN Smith, Annie B, NP       amLODipine  (NORVASC ) tablet 10 mg  10 mg Oral Daily Smith, Annie B, NP   10 mg at 04/15/24 9050   atorvastatin  (LIPITOR) tablet 40 mg  40 mg Oral Daily Smith, Annie B, NP   40 mg at 04/15/24 0949   empagliflozin  (JARDIANCE ) tablet 25 mg  25 mg Oral Daily Smith, Annie B, NP   25 mg at 04/15/24 9050   escitalopram   (LEXAPRO ) tablet 10 mg  10 mg Oral Daily Smith, Annie B, NP   10 mg at 04/15/24 9050   insulin  aspart (novoLOG ) injection 0-15 Units  0-15 Units Subcutaneous TID WC Smith, Annie B, NP   5 Units at 04/15/24 1702   insulin  glargine (LANTUS ) injection 15 Units  15 Units Subcutaneous QHS Donnelly Mellow, MD   15 Units at 04/15/24 2128   lisinopril  (ZESTRIL ) tablet 40 mg  40 mg Oral Daily Smith, Annie B, NP   40 mg at 04/15/24 9050   magnesium   hydroxide (MILK OF MAGNESIA) suspension 30 mL  30 mL Oral Daily PRN Smith, Annie B, NP       OLANZapine  (ZYPREXA ) injection 5 mg  5 mg Intramuscular TID PRN Smith, Annie B, NP       OLANZapine  zydis (ZYPREXA ) disintegrating tablet 5 mg  5 mg Oral TID PRN Smith, Annie B, NP       pantoprazole  (PROTONIX ) EC tablet 40 mg  40 mg Oral Daily Smith, Annie B, NP   40 mg at 04/15/24 9050    Lab Results:  Results for orders placed or performed during the hospital encounter of 04/14/24 (from the past 48 hours)  Glucose, capillary     Status: Abnormal   Collection Time: 04/14/24  7:36 PM  Result Value Ref Range   Glucose-Capillary 175 (H) 70 - 99 mg/dL    Comment: Glucose reference range applies only to samples taken after fasting for at least 8 hours.  Lipid panel     Status: Abnormal   Collection Time: 04/15/24  7:13 AM  Result Value Ref Range   Cholesterol 199 0 - 200 mg/dL    Comment:        ATP III CLASSIFICATION:  <200     mg/dL   Desirable  799-760  mg/dL   Borderline High  >=759    mg/dL   High           Triglycerides 123 <150 mg/dL   HDL 64 >59 mg/dL   Total CHOL/HDL Ratio 3.1 RATIO   VLDL 25 0 - 40 mg/dL   LDL Cholesterol 888 (H) 0 - 99 mg/dL    Comment:        Total Cholesterol/HDL:CHD Risk Coronary Heart Disease Risk Table                     Men   Women  1/2 Average Risk   3.4   3.3  Average Risk       5.0   4.4  2 X Average Risk   9.6   7.1  3 X Average Risk  23.4   11.0        Use the calculated Patient Ratio above and the CHD Risk  Table to determine the patient's CHD Risk.        ATP III CLASSIFICATION (LDL):  <100     mg/dL   Optimal  899-870  mg/dL   Near or Above                    Optimal  130-159  mg/dL   Borderline  839-810  mg/dL   High  >809     mg/dL   Very High Performed at [facility], [address] [place], [place] [address]   Glucose, capillary     Status: Abnormal   Collection Time: 04/15/24  7:16 AM  Result Value Ref Range   Glucose-Capillary 195 (H) 70 - 99 mg/dL    Comment: Glucose reference range applies only to samples taken after fasting for at least 8 hours.  Glucose, capillary     Status: Abnormal   Collection Time: 04/15/24 11:11 AM  Result Value Ref Range   Glucose-Capillary 197 (H) 70 - 99 mg/dL    Comment: Glucose reference range applies only to samples taken after fasting for at least 8 hours.  Glucose, capillary     Status: Abnormal   Collection Time: 04/15/24  4:24 PM  Result Value Ref Range  Glucose-Capillary 223 (H) 70 - 99 mg/dL    Comment: Glucose reference range applies only to samples taken after fasting for at least 8 hours.    Blood Alcohol level:  Lab Results  Component Value Date   [facility] <15 04/13/2024   ETH <15 11/01/2023    Metabolic Disorder Labs: Lab Results  Component Value Date   HGBA1C 6.6 (H) 11/01/2023   MPG 143 11/01/2023   MPG 194.38 04/28/2021   No results found for: PROLACTIN Lab Results  Component Value Date   CHOL 199 04/15/2024   TRIG 123 04/15/2024   HDL 64 04/15/2024   CHOLHDL 3.1 04/15/2024   VLDL 25 04/15/2024   LDLCALC 111 (H) 04/15/2024   LDLCALC 67 04/28/2021    Physical Findings: AIMS:  ,  ,  ,  ,  ,  ,   CIWA:    COWS:     Musculoskeletal: Strength & Muscle Tone: within normal limits Gait & Station: normal Patient leans: N/A  Psychiatric Specialty Exam:  Presentation  General Appearance:  Appropriate for Environment  Eye Contact: Fair  Speech: Clear and Coherent  Speech  Volume: Normal  Handedness: Right   Mood and Affect  Mood: Depressed; Dysphoric  Affect: Appropriate   Thought Process  Thought Processes: Coherent  Descriptions of Associations:Intact  Orientation:Full (Time, Place and Person)  Thought Content:Logical  History of Schizophrenia/Schizoaffective disorder:No  Duration of Psychotic Symptoms:No data recorded Hallucinations:Hallucinations: None  Ideas of Reference:None  Suicidal Thoughts:Suicidal Thoughts: Yes, Passive  Homicidal Thoughts:Homicidal Thoughts: No   Sensorium  Memory: Immediate Fair  Judgment: Impaired  Insight: Shallow   Executive Functions  Concentration: Fair  Attention Span: Fair  Recall: Fair  Fund of Knowledge: Fair  Language: Fair   Psychomotor Activity  Psychomotor Activity: Psychomotor Activity: Normal   Assets  Assets: Communication Skills; Desire for Improvement   Sleep  Sleep: Sleep: Fair    Physical Exam: Physical Exam ROS Blood pressure 130/83, pulse (!) 105, temperature (!) 97.5 F (36.4 C), resp. rate 18, height 5' 2 (1.575 m), weight 68.5 kg, SpO2 98%. Body mass index is 27.62 kg/m.   Treatment Plan Summary: Principal Diagnosis: <principal problem not specified> Diagnosis:  Active Problems:   MDD (major depressive disorder), recurrent episode, moderate (HCC)     Clinical Decision Making:   Treatment Plan Summary:   Safety and Monitoring:             -- Voluntary admission to inpatient psychiatric unit for safety, stabilization and treatment             -- Daily contact with patient to assess and evaluate symptoms and progress in treatment             -- Patient's case to be discussed in multi-disciplinary team meeting             -- Observation Level: q15 minute checks             -- Vital signs:  q12 hours             -- Precautions: suicide, elopement, and assault   2. Psychiatric Diagnoses and Treatment:              Lexapro  10 mg  daily     -- The risks/benefits/side-effects/alternatives to this medication were discussed in detail with the patient and time was given for questions. The patient consents to medication trial.                --  Metabolic profile and EKG monitoring obtained while on an atypical antipsychotic (BMI: Lipid Panel: HbgA1c: QTc:)              -- Encouraged patient to participate in unit milieu and in scheduled group therapies                            3. Medical Issues Being Addressed:      4. Discharge Planning:              -- Social work and case management to assist with discharge planning and identification of hospital follow-up needs prior to discharge             -- Estimated LOS: 5-7 days             -- Discharge Concerns: Need to establish a safety plan; Medication compliance and effectiveness             -- Discharge Goals: Return home with outpatient referrals follow ups   Physician Treatment Plan for Primary Diagnosis: <principal problem not specified> Long Term Goal(s): Improvement in symptoms so as ready for discharge   Short Term Goals: Ability to identify changes in lifestyle to reduce recurrence of condition will improve, Ability to verbalize feelings will improve, Ability to disclose and discuss suicidal ideas, Ability to demonstrate self-control will improve, and Ability to identify and develop effective coping behaviors will improve   Physician Treatment Plan for Secondary Diagnosis: Active Problems:   MDD (major depressive disorder), recurrent episode, moderate (HCC)   Long Term Goal(s): Improvement in symptoms so as ready for discharge   Short Term Goals: Ability to identify changes in lifestyle to reduce recurrence of condition will improve, Ability to verbalize feelings will improve, Ability to disclose and discuss suicidal ideas, Ability to demonstrate self-control will improve, Ability to identify and develop effective coping behaviors will improve, and Ability to  identify triggers associated with substance abuse/mental health issues will improve   I certify that inpatient services furnished can reasonably be expected to improve the patient's condition.   Desmond Chimera, MD 04/15/2024, 10:06 PM
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
## 2024-04-15 NOTE — Plan of Care (Signed)

## 2024-04-15 NOTE — Plan of Care (Signed)
" °  Problem: Coping: Goal: Coping ability will improve Outcome: Progressing Goal: Will verbalize feelings Outcome: Progressing   Problem: Safety: Goal: Ability to disclose and discuss suicidal ideas will improve Outcome: Progressing Goal: Ability to identify and utilize support systems that promote safety will improve Outcome: Progressing   Problem: Self-Concept: Goal: Level of anxiety will decrease Outcome: Progressing   "

## 2024-04-15 NOTE — Progress Notes (Signed)
" °   04/15/24 0154  Psych Admission Type (Psych Patients Only)  Admission Status Voluntary  Psychosocial Assessment  Patient Complaints Anxiety;Depression  Eye Contact Fair  Facial Expression Sad  Affect Appropriate to circumstance  Speech Logical/coherent  Interaction Minimal  Motor Activity Slow  Appearance/Hygiene In scrubs  Behavior Characteristics Cooperative  Mood Pleasant;Sad  Aggressive Behavior  Effect No apparent injury  Thought Process  Coherency WDL  Content WDL  Delusions None reported or observed  Perception WDL  Hallucination None reported or observed  Judgment Impaired  Confusion None  Danger to Self  Current suicidal ideation? Denies  Self-Injurious Behavior No self-injurious ideation or behavior indicators observed or expressed   Agreement Not to Harm Self Yes  Description of Agreement Verbal  Danger to Others  Danger to Others None reported or observed    "

## 2024-04-15 NOTE — Group Note (Signed)
 Date:  04/15/2024 Time:  10:54 AM  Group Topic/Focus:  Wellness Toolbox:   The focus of this group is to discuss various aspects of wellness, balancing those aspects and exploring ways to increase the ability to experience wellness.  Patients will create a wellness toolbox for use upon discharge.  I had the opportunity to go over the holiday and New Year activity sheets with each patient individually. The activity sheets provided patients with an opportunity to express personal memories, reflect on meaningful life experiences, and share favorite traditions, movies, and meals. Patients were also encouraged to discuss gentle New Year plans and goals, focusing on realistic and positive intentions for the upcoming year.  The activities supported cognitive engagement through word searches, memory recall, simple problem-solving, and reflection exercises. Patients were able to participate at their own pace, and assistance was provided as needed. The activity sheets also included a brief, gentle movement component, allowing patients to engage in light physical activity appropriate to their abilities.  Overall, the activity promoted emotional expression, social interaction, and mental stimulation in a calm and supportive manner. Patients appeared engaged and receptive, and the activity provided a meaningful opportunity for conversation, self-expression, and encouragement of overall well-being.  Participation Level:  Did Not Attend  Participation Quality:    Affect:    Cognitive:    Insight:   Engagement in Group:    Modes of Intervention:    Additional Comments:    Temesgen Weightman L Bettey Muraoka 04/15/2024, 10:54 AM

## 2024-04-15 NOTE — Progress Notes (Signed)
 Patient accepted medications without issue. Endorses passive SI. ACHS with coverage needed for each meal.    04/15/24 1100  Psych Admission Type (Psych Patients Only)  Admission Status Voluntary  Psychosocial Assessment  Patient Complaints Self-harm thoughts  Eye Contact Fair  Facial Expression Flat  Affect Appropriate to circumstance  Speech Logical/coherent  Interaction Minimal  Motor Activity Slow  Appearance/Hygiene In scrubs  Behavior Characteristics Calm;Cooperative  Mood Sad;Pleasant  Thought Process  Coherency WDL  Content WDL  Delusions None reported or observed  Perception WDL  Hallucination None reported or observed  Judgment Impaired  Confusion None  Danger to Self  Current suicidal ideation? Passive  Agreement Not to Harm Self Yes  Description of Agreement verbal  Danger to Others  Danger to Others None reported or observed

## 2024-04-15 NOTE — H&P (Incomplete)
 " Psychiatric Admission Assessment Adult  Patient Identification: Kelly Doyle MRN:  [id number] Date of Evaluation:  04/15/2024 Chief Complaint:  MDD (major depressive disorder) [F32.9]   History of Present Illness:  patient presents with a history of recurrent depression, now presenting with suicidal ideation.  Patient has become estranged from her daughter, who live locally.  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  Even there, however, she had interpersonal issues. Earlier today she spoke with her son, who is moving from [place] to [place] to help care for patient, and she told him that she was wanting to kill herself.Patient is admitted to [facility] unit with Q15 min safety monitoring. Multidisciplinary team approach is offered. Medication management; group/milieu therapy is offered.   Total Time spent with patient: 1 hour Sleep  Sleep:Sleep: Fair  Past Psychiatric History:  Psychiatric History:  Information collected from patient/chart  Past Psychiatric History: Reports a history of depression & anxiety without panic attacks, not being treated medically Psychiatric History: Does report inpatient psychiatric hospital admission in the past- [facility] July 2025  as well as depression and anxiety. Has seen a psychiatrist in the past on an outpatient basis, but does not see anyone currently. No current medications for anxiety or depression. Information collected from patient, chart review   Prev Dx/Sx: Depression, anxiety  Current Psych Provider: None Home Meds (current): Amlodipine , Atorvastatin , Glipizide , Jardiance , Lisinopril , insulin  aspart Previous Med Trials: Patient cannot recall  Therapy: None currently   Prior Psych Hospitalization: One psych hospital admission in 1973 for depression  Prior Self Harm: No previous SI or attempts Prior Violence: None   Family Psych  History: None Family Hx suicide: None   Social History:  Developmental Hx:  Educational Hx:  Occupational Hx:  Legal Hx: None Living Situation: Unstable. Was living with her grandson, but his girlfriend no longer wanted the pt there. Her daughter's house had too many people there, so was not a long-term option. Has spent some time in the park when there was no other option. Expresses that she may be able to get housing with her son, who is currently in [place] at a drug rehabilitation center. He is expected to return to the area on August 1st. She does express worry that this may not be a reliable option, and that her son may turn to drugs again.  Spiritual Hx:  Access to weapons/lethal means: Denies access to any firearms    Substance History Alcohol: None  Tobacco: None Illicit drugs: None Is the patient at risk to self? Yes.    Has the patient been a risk to self in the past 6 months? No.  Has the patient been a risk to self within the distant past? No.  Is the patient a risk to others? No.  Has the patient been a risk to others in the past 6 months? No.  Has the patient been a risk to others within the distant past? No.   Columbia Scale:  Flowsheet Row Admission (Current) from 04/14/2024 in [facility] BE[facility] ED from 04/13/2024 in [facility] Emergency Department at [facility] Admission (Discharged) from 11/02/2023 in [facility]  C-SSRS RISK CATEGORY Low Risk High Risk Error: Question 6 not populated     Past Medical History:  Past Medical History:  Diagnosis Date   Anxiety    Arthritis  Dementia [facility])    per son on 12/25   Diabetes mellitus without complication (HCC)    GERD (gastroesophageal reflux disease)    Hypertension     Past Surgical History:  Procedure Laterality Date   COLONOSCOPY WITH PROPOFOL  N/A 06/23/2017   Procedure: COLONOSCOPY WITH PROPOFOL ;  Surgeon: Toledo, Ladell POUR, MD;  Location: [facility] ENDOSCOPY;   Service: Gastroenterology;  Laterality: N/A;   DILATION AND CURETTAGE OF UTERUS  04/20/1973   NO PAST SURGERIES     TUBAL LIGATION     Family History:  Family History  Problem Relation Age of Onset   Alzheimer's disease Mother    Hypertension Mother    Diabetes Sister    Gout Brother     Social History:  Social History   Substance and Sexual Activity  Alcohol Use No     Social History   Substance and Sexual Activity  Drug Use No      Allergies:  Allergies[1] Lab Results:  Results for orders placed or performed during the hospital encounter of 04/14/24 (from the past 48 hours)  Glucose, capillary     Status: Abnormal   Collection Time: 04/14/24  7:36 PM  Result Value Ref Range   Glucose-Capillary 175 (H) 70 - 99 mg/dL    Comment: Glucose reference range applies only to samples taken after fasting for at least 8 hours.  Lipid panel     Status: Abnormal   Collection Time: 04/15/24  7:13 AM  Result Value Ref Range   Cholesterol 199 0 - 200 mg/dL    Comment:        ATP III CLASSIFICATION:  <200     mg/dL   Desirable  799-760  mg/dL   Borderline High  >=759    mg/dL   High           Triglycerides 123 <150 mg/dL   HDL 64 >59 mg/dL   Total CHOL/HDL Ratio 3.1 RATIO   VLDL 25 0 - 40 mg/dL   LDL Cholesterol 888 (H) 0 - 99 mg/dL    Comment:        Total Cholesterol/HDL:CHD Risk Coronary Heart Disease Risk Table                     Men   Women  1/2 Average Risk   3.4   3.3  Average Risk       5.0   4.4  2 X Average Risk   9.6   7.1  3 X Average Risk  23.4   11.0        Use the calculated Patient Ratio above and the CHD Risk Table to determine the patient's CHD Risk.        ATP III CLASSIFICATION (LDL):  <100     mg/dL   Optimal  899-870  mg/dL   Near or Above                    Optimal  130-159  mg/dL   Borderline  839-810  mg/dL   High  >809     mg/dL   Very High Performed at [facility], [address] [place], [place] [address]   Glucose,  capillary     Status: Abnormal   Collection Time: 04/15/24  7:16 AM  Result Value Ref Range   Glucose-Capillary 195 (H) 70 - 99 mg/dL    Comment: Glucose reference range applies only to samples taken after fasting for at least  8 hours.  Glucose, capillary     Status: Abnormal   Collection Time: 04/15/24 11:11 AM  Result Value Ref Range   Glucose-Capillary 197 (H) 70 - 99 mg/dL    Comment: Glucose reference range applies only to samples taken after fasting for at least 8 hours.  Glucose, capillary     Status: Abnormal   Collection Time: 04/15/24  4:24 PM  Result Value Ref Range   Glucose-Capillary 223 (H) 70 - 99 mg/dL    Comment: Glucose reference range applies only to samples taken after fasting for at least 8 hours.    Blood Alcohol level:  Lab Results  Component Value Date   [facility] <15 04/13/2024   ETH <15 11/01/2023    Metabolic Disorder Labs:  Lab Results  Component Value Date   HGBA1C 6.6 (H) 11/01/2023   MPG 143 11/01/2023   MPG 194.38 04/28/2021   No results found for: PROLACTIN Lab Results  Component Value Date   CHOL 199 04/15/2024   TRIG 123 04/15/2024   HDL 64 04/15/2024   CHOLHDL 3.1 04/15/2024   VLDL 25 04/15/2024   LDLCALC 111 (H) 04/15/2024   LDLCALC 67 04/28/2021    Current Medications: Current Facility-Administered Medications  Medication Dose Route Frequency Provider Last Rate Last Admin   acetaminophen  (TYLENOL ) tablet 650 mg  650 mg Oral Q6H PRN Smith, Annie B, NP       alum & mag hydroxide-simeth (MAALOX/MYLANTA) 200-200-20 MG/5ML suspension 30 mL  30 mL Oral Q4H PRN Smith, Annie B, NP       amLODipine  (NORVASC ) tablet 10 mg  10 mg Oral Daily Smith, Annie B, NP   10 mg at 04/15/24 9050   atorvastatin  (LIPITOR) tablet 40 mg  40 mg Oral Daily Smith, Annie B, NP   40 mg at 04/15/24 9050   empagliflozin  (JARDIANCE ) tablet 25 mg  25 mg Oral Daily Smith, Annie B, NP   25 mg at 04/15/24 9050   escitalopram  (LEXAPRO ) tablet 10 mg  10 mg Oral Daily  Smith, Annie B, NP   10 mg at 04/15/24 9050   insulin  aspart (novoLOG ) injection 0-15 Units  0-15 Units Subcutaneous TID WC Smith, Annie B, NP   5 Units at 04/15/24 1702   insulin  glargine (LANTUS ) injection 15 Units  15 Units Subcutaneous QHS Jadapalle, Sree, MD   15 Units at 04/14/24 2137   lisinopril  (ZESTRIL ) tablet 40 mg  40 mg Oral Daily Smith, Annie B, NP   40 mg at 04/15/24 9050   magnesium  hydroxide (MILK OF MAGNESIA) suspension 30 mL  30 mL Oral Daily PRN Smith, Annie B, NP       OLANZapine  (ZYPREXA ) injection 5 mg  5 mg Intramuscular TID PRN Smith, Annie B, NP       OLANZapine  zydis (ZYPREXA ) disintegrating tablet 5 mg  5 mg Oral TID PRN Smith, Annie B, NP       pantoprazole  (PROTONIX ) EC tablet 40 mg  40 mg Oral Daily Smith, Annie B, NP   40 mg at 04/15/24 9050   PTA Medications: Medications Prior to Admission  Medication Sig Dispense Refill Last Dose/Taking   acetaminophen  (TYLENOL ) 650 MG CR tablet SMARTSIG:1-2 Tablet(s) By Mouth Every 8 Hours PRN (Patient not taking: Reported on 11/02/2023)      amLODipine  (NORVASC ) 10 MG tablet Take 1 tablet (10 mg total) by mouth daily. 30 tablet 2    atorvastatin  (LIPITOR) 40 MG tablet Take 1 tablet (40 mg total) by mouth daily. 90  tablet 1    baclofen (LIORESAL) 10 MG tablet Take 10 mg by mouth daily as needed.      BD PEN NEEDLE NANO 2ND GEN 32G X 4 MM MISC 3 (three) times daily. :3 Times Daily      blood glucose meter kit and supplies KIT Dispense based on patient and insurance preference. Use up to four times daily as directed. 1 each 0    blood glucose meter kit and supplies Dispense based on patient and insurance preference. Use up to four times daily as directed. (FOR ICD-10 E10.9, E11.9). 1 each 0    empagliflozin  (JARDIANCE ) 25 MG TABS tablet Take 1 tablet (25 mg total) by mouth daily. (Patient not taking: Reported on 04/14/2024) 30 tablet 0    escitalopram  (LEXAPRO ) 10 MG tablet Take 10 mg by mouth daily.      insulin  lispro (HUMALOG )  100 UNIT/ML KwikPen Inject 0-9 units into the skin 3 (three) times daily with meals and inject 0-5 units into the skin at bedtime. (Patient not taking: Reported on 04/14/2024) 15 mL 11    insulin  aspart (NOVOLOG ) 100 UNIT/ML injection Inject 0-9 Units into the skin 3 (three) times daily with meals. (Patient not taking: Reported on 04/14/2024) 10 mL 11    Insulin  Pen Needle (INSUPEN PEN NEEDLES) 31G X 5 MM MISC Use 1 each 4 (four) times daily with insulin . 100 each 0    LANTUS  SOLOSTAR 100 UNIT/ML Solostar Pen Inject 30 Units into the skin daily.      lisinopril  (ZESTRIL ) 40 MG tablet Take 1 tablet (40 mg total) by mouth daily. 30 tablet 2    OZEMPIC, 0.25 OR 0.5 MG/DOSE, 2 MG/3ML SOPN Inject 0.25 mg into the skin once a week.      pantoprazole  (PROTONIX ) 40 MG tablet Take 1 tablet (40 mg total) by mouth daily. (Patient not taking: Reported on 04/14/2024) 30 tablet 0    sertraline  (ZOLOFT ) 50 MG tablet Take 1 tablet (50 mg total) by mouth daily. (Patient not taking: Reported on 04/14/2024) 30 tablet 0    traZODone  (DESYREL ) 50 MG tablet Take 1 tablet (50 mg total) by mouth at bedtime. (Patient not taking: Reported on 04/14/2024) 30 tablet 0    VICTOZA 18 MG/3ML SOPN Inject into the skin. (Patient not taking: Reported on 11/02/2023)       Psychiatric Specialty Exam:  Presentation  General Appearance:  Appropriate for Environment  Eye Contact: Fair  Speech: Clear and Coherent  Speech Volume: Normal    Mood and Affect  Mood: Depressed; Dysphoric  Affect: Appropriate   Thought Process  Thought Processes: Coherent  Descriptions of Associations:Intact  Orientation:Full (Time, Place and Person)  Thought Content:Logical  Hallucinations:Hallucinations: None  Ideas of Reference:None  Suicidal Thoughts:Suicidal Thoughts: Yes, Passive  Homicidal Thoughts:Homicidal Thoughts: No   Sensorium  Memory: Immediate Fair  Judgment: Impaired  Insight: Shallow   Executive  Functions  Concentration: Fair  Attention Span: Fair  Recall: Fair  Fund of Knowledge: Fair  Language: Fair   Psychomotor Activity  Psychomotor Activity: Psychomotor Activity: Normal   Assets  Assets: Communication Skills; Desire for Improvement    Musculoskeletal: Strength & Muscle Tone: within normal limits Gait & Station: normal  Physical Exam: Physical Exam Vitals and nursing note reviewed.  HENT:     Head: Normocephalic.     Nose: Nose normal.     Mouth/Throat:     Mouth: Mucous membranes are moist.  Cardiovascular:     Rate and Rhythm: Normal  rate.  Pulmonary:     Effort: Pulmonary effort is normal.  Abdominal:     General: Bowel sounds are normal.  Neurological:     Mental Status: She is alert.    Review of Systems  Constitutional: Negative.   HENT: Negative.    Eyes: Negative.   Respiratory: Negative.    Cardiovascular: Negative.   Skin: Negative.    Blood pressure (!) 138/117, pulse (!) 50, temperature (!) 97.4 F (36.3 C), resp. rate 17, height 5' 2 (1.575 m), weight 68.5 kg, SpO2 96%. Body mass index is 27.62 kg/m.  Principal Diagnosis: <principal problem not specified> Diagnosis:  Active Problems:   MDD (major depressive disorder), recurrent episode, moderate (HCC)   Clinical Decision Making:  Treatment Plan Summary:  Safety and Monitoring:             -- Voluntary admission to inpatient psychiatric unit for safety, stabilization and treatment             -- Daily contact with patient to assess and evaluate symptoms and progress in treatment             -- Patient's case to be discussed in multi-disciplinary team meeting             -- Observation Level: q15 minute checks             -- Vital signs:  q12 hours             -- Precautions: suicide, elopement, and assault   2. Psychiatric Diagnoses and Treatment:              Lexapro  10 mg daily     -- The risks/benefits/side-effects/alternatives to this medication were  discussed in detail with the patient and time was given for questions. The patient consents to medication trial.                -- Metabolic profile and EKG monitoring obtained while on an atypical antipsychotic (BMI: Lipid Panel: HbgA1c: QTc:)              -- Encouraged patient to participate in unit milieu and in scheduled group therapies                            3. Medical Issues Being Addressed:      4. Discharge Planning:              -- Social work and case management to assist with discharge planning and identification of hospital follow-up needs prior to discharge             -- Estimated LOS: 5-7 days             -- Discharge Concerns: Need to establish a safety plan; Medication compliance and effectiveness             -- Discharge Goals: Return home with outpatient referrals follow ups  Physician Treatment Plan for Primary Diagnosis: <principal problem not specified> Long Term Goal(s): Improvement in symptoms so as ready for discharge  Short Term Goals: Ability to identify changes in lifestyle to reduce recurrence of condition will improve, Ability to verbalize feelings will improve, Ability to disclose and discuss suicidal ideas, Ability to demonstrate self-control will improve, and Ability to identify and develop effective coping behaviors will improve  Physician Treatment Plan for Secondary Diagnosis: Active Problems:   MDD (major depressive disorder), recurrent episode, moderate (HCC)  Long Term  Goal(s): Improvement in symptoms so as ready for discharge  Short Term Goals: Ability to identify changes in lifestyle to reduce recurrence of condition will improve, Ability to verbalize feelings will improve, Ability to disclose and discuss suicidal ideas, Ability to demonstrate self-control will improve, Ability to identify and develop effective coping behaviors will improve, and Ability to identify triggers associated with substance abuse/mental health issues will improve  I  certify that inpatient services furnished can reasonably be expected to improve the patient's condition.    Marialena Wollen, MD 12/27/20255:55 PM     [1] No Known Allergies  "

## 2024-04-15 NOTE — Group Note (Signed)
 Date:  04/15/2024 Time:  4:11 PM  Group Topic/Focus:  Fresh air Therapy with Music and Conversation    Participation Level:  Did Not Attend    Norleen SHAUNNA Bias 04/15/2024, 4:11 PM

## 2024-04-16 DIAGNOSIS — F331 Major depressive disorder, recurrent, moderate: Secondary | ICD-10-CM | POA: Diagnosis not present

## 2024-04-16 LAB — GLUCOSE, CAPILLARY
Glucose-Capillary: 254 mg/dL — ABNORMAL HIGH (ref 70–99)
Glucose-Capillary: 129 mg/dL — ABNORMAL HIGH (ref 70–99)
Glucose-Capillary: 165 mg/dL — ABNORMAL HIGH (ref 70–99)
Glucose-Capillary: 180 mg/dL — ABNORMAL HIGH (ref 70–99)
Glucose-Capillary: 189 mg/dL — ABNORMAL HIGH (ref 70–99)
Glucose-Capillary: 184 mg/dL — ABNORMAL HIGH (ref 70–99)

## 2024-04-16 LAB — HEMOGLOBIN A1C
Hgb A1c MFr Bld: 9.4 % — ABNORMAL HIGH (ref 4.8–5.6)
Mean Plasma Glucose: 223.08 mg/dL

## 2024-04-16 NOTE — Progress Notes (Signed)
 [facility] MD Progress Note  04/16/2024 5:26 PM Kelly Doyle  MRN:  [id number] Chief Complaint:  MDD (major depressive disorder) [F32.9]   patient presents with a history of recurrent depression, now presenting with suicidal ideation.  Patient has become estranged from her daughter, who live locally.  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  Even there, however, she had interpersonal issues. Earlier today she spoke with her son, who is moving from [place] to [place] to help care for patient, and she told him that she was wanting to kill herself.Patient is admitted to [facility] unit with Q15 min safety monitoring. Multidisciplinary team approach is offered. Medication management; group/milieu therapy is offered.   Subjective:   RN staff report patient is doing well.  Slept for approximately 9-1/2 hours.  They are no longer seeing patient hand shakiness.  Interviewed reports that she is feeling good.  Today she may have some headaches but otherwise fine.  Patient was started on Lexapro  and this could be a side effect of Lexapro  having headache.  Patient was assured that this would go away if she in few days and we can give her as needed medication for headache for now.  Patient denies having any depressive symptoms.  The patient reports that she does not think that her son has stolen things from her but does report a distrust in him.  She reports that her son has had a Bank card and she will come to know when she gets out and look at her card.  Denies any suicidal or homicidal thoughts or intentions or plan.  Denies any auditory visual hallucinations.  Principal Problem: <principal problem not specified> Diagnosis: Active Problems:   MDD (major depressive disorder), recurrent episode, moderate (HCC)   Past Psychiatric History: se hpi  Past Medical History:  Past Medical History:   Diagnosis Date   Anxiety    Arthritis    Dementia (HCC)    per son on 12/25   Diabetes mellitus without complication (HCC)    GERD (gastroesophageal reflux disease)    Hypertension     Past Surgical History:  Procedure Laterality Date   COLONOSCOPY WITH PROPOFOL  N/A 06/23/2017   Procedure: COLONOSCOPY WITH PROPOFOL ;  Surgeon: Toledo, Ladell MARLA, MD;  Location: [facility] ENDOSCOPY;  Service: Gastroenterology;  Laterality: N/A;   DILATION AND CURETTAGE OF UTERUS  04/20/1973   NO PAST SURGERIES     TUBAL LIGATION     Family History:  Family History  Problem Relation Age of Onset   Alzheimer's disease Mother    Hypertension Mother    Diabetes Sister    Gout Brother     Social History:  Social History   Substance and Sexual Activity  Alcohol Use No     Social History   Substance and Sexual Activity  Drug Use No    Social History   Socioeconomic History   Marital status: Single    Spouse name: Not on file   Number of children: Not on file   Years of education: Not on file   Highest education level: Not on file  Occupational History   Not on file  Tobacco Use   Smoking status: Never   Smokeless tobacco: Never  Vaping Use   Vaping status: Never Used  Substance and Sexual Activity   Alcohol use: No   Drug use:  No   Sexual activity: Yes    Birth control/protection: None  Other Topics Concern   Not on file  Social History Narrative   Not on file   Social Drivers of Health   Tobacco Use: Low Risk (04/14/2024)   Patient History    Smoking Tobacco Use: Never    Smokeless Tobacco Use: Never    Passive Exposure: Not on file  Financial Resource Strain: Not on file  Food Insecurity: No Food Insecurity (04/14/2024)   Epic    Worried About Programme Researcher, Broadcasting/film/video in the Last Year: Never true    Ran Out of Food in the Last Year: Never true  Transportation Needs: No Transportation Needs (04/14/2024)   Epic    Lack of Transportation (Medical): No    Lack of Transportation  (Non-Medical): No  Physical Activity: Not on file  Stress: Not on file  Social Connections: Socially Isolated (04/14/2024)   Social Connection and Isolation Panel    Frequency of Communication with Friends and Family: Once a week    Frequency of Social Gatherings with Friends and Family: Never    Attends Religious Services: Never    Database Administrator or Organizations: Yes    Attends Banker Meetings: Never    Marital Status: Never married  Depression (PHQ2-9): Not on file  Alcohol Screen: Low Risk (04/14/2024)   Alcohol Screen    Last Alcohol Screening Score (AUDIT): 0  Housing: High Risk (04/14/2024)   Epic    Unable to Pay for Housing in the Last Year: No    Number of Times Moved in the Last Year: 2    Homeless in the Last Year: Yes  Utilities: Not At Risk (04/14/2024)   Epic    Threatened with loss of utilities: No  Health Literacy: Not on file   Additional Social History:                         Sleep: Fair Estimated Sleeping Duration (Last 24 Hours): 12.25-15.25 hours  Appetite:  Fair  Current Medications: Current Facility-Administered Medications  Medication Dose Route Frequency Provider Last Rate Last Admin   acetaminophen  (TYLENOL ) tablet 650 mg  650 mg Oral Q6H PRN Smith, Annie B, NP   650 mg at 04/16/24 0926   alum & mag hydroxide-simeth (MAALOX/MYLANTA) 200-200-20 MG/5ML suspension 30 mL  30 mL Oral Q4H PRN Smith, Annie B, NP       amLODipine  (NORVASC ) tablet 10 mg  10 mg Oral Daily Smith, Annie B, NP   10 mg at 04/16/24 9073   atorvastatin  (LIPITOR) tablet 40 mg  40 mg Oral Daily Smith, Annie B, NP   40 mg at 04/16/24 9073   empagliflozin  (JARDIANCE ) tablet 25 mg  25 mg Oral Daily Smith, Annie B, NP   25 mg at 04/16/24 9073   escitalopram  (LEXAPRO ) tablet 10 mg  10 mg Oral Daily Smith, Annie B, NP   10 mg at 04/16/24 9072   insulin  aspart (novoLOG ) injection 0-15 Units  0-15 Units Subcutaneous TID WC Smith, Annie B, NP   3 Units at  04/16/24 1630   insulin  glargine (LANTUS ) injection 15 Units  15 Units Subcutaneous QHS Donnelly Mellow, MD   15 Units at 04/15/24 2128   lisinopril  (ZESTRIL ) tablet 40 mg  40 mg Oral Daily Smith, Annie B, NP   40 mg at 04/16/24 9072   magnesium  hydroxide (MILK OF MAGNESIA) suspension 30 mL  30 mL  Oral Daily PRN Smith, Annie B, NP       OLANZapine  (ZYPREXA ) injection 5 mg  5 mg Intramuscular TID PRN Smith, Annie B, NP       OLANZapine  zydis (ZYPREXA ) disintegrating tablet 5 mg  5 mg Oral TID PRN Smith, Annie B, NP       pantoprazole  (PROTONIX ) EC tablet 40 mg  40 mg Oral Daily Smith, Annie B, NP   40 mg at 04/16/24 9073    Lab Results:  Results for orders placed or performed during the hospital encounter of 04/14/24 (from the past 48 hours)  Glucose, capillary     Status: Abnormal   Collection Time: 04/14/24  7:36 PM  Result Value Ref Range   Glucose-Capillary 175 (H) 70 - 99 mg/dL    Comment: Glucose reference range applies only to samples taken after fasting for at least 8 hours.  Hemoglobin A1c     Status: Abnormal   Collection Time: 04/15/24  7:13 AM  Result Value Ref Range   Hgb A1c MFr Bld 9.4 (H) 4.8 - 5.6 %    Comment: (NOTE) Diagnosis of Diabetes The following HbA1c ranges recommended by the American Diabetes Association (ADA) may be used as an aid in the diagnosis of diabetes mellitus.  Hemoglobin             Suggested A1C NGSP%              Diagnosis  <5.7                   Non Diabetic  5.7-6.4                Pre-Diabetic  >6.4                   Diabetic  <7.0                   Glycemic control for                       adults with diabetes.     Mean Plasma Glucose 223.08 mg/dL    Comment: Performed at [facility], [address], [place], [place] [address]  Lipid panel     Status: Abnormal   Collection Time: 04/15/24  7:13 AM  Result Value Ref Range   Cholesterol 199 0 - 200 mg/dL    Comment:        ATP III CLASSIFICATION:  <200     mg/dL    Desirable  799-760  mg/dL   Borderline High  >=759    mg/dL   High           Triglycerides 123 <150 mg/dL   HDL 64 >59 mg/dL   Total CHOL/HDL Ratio 3.1 RATIO   VLDL 25 0 - 40 mg/dL   LDL Cholesterol 888 (H) 0 - 99 mg/dL    Comment:        Total Cholesterol/HDL:CHD Risk Coronary Heart Disease Risk Table                     Men   Women  1/2 Average Risk   3.4   3.3  Average Risk       5.0   4.4  2 X Average Risk   9.6   7.1  3 X Average Risk  23.4   11.0        Use the calculated Patient Ratio above  and the CHD Risk Table to determine the patient's CHD Risk.        ATP III CLASSIFICATION (LDL):  <100     mg/dL   Optimal  899-870  mg/dL   Near or Above                    Optimal  130-159  mg/dL   Borderline  839-810  mg/dL   High  >809     mg/dL   Very High Performed at [facility], [address] [place], [place] [address]   Glucose, capillary     Status: Abnormal   Collection Time: 04/15/24  7:16 AM  Result Value Ref Range   Glucose-Capillary 195 (H) 70 - 99 mg/dL    Comment: Glucose reference range applies only to samples taken after fasting for at least 8 hours.  Glucose, capillary     Status: Abnormal   Collection Time: 04/15/24 11:11 AM  Result Value Ref Range   Glucose-Capillary 197 (H) 70 - 99 mg/dL    Comment: Glucose reference range applies only to samples taken after fasting for at least 8 hours.  Glucose, capillary     Status: Abnormal   Collection Time: 04/15/24  4:24 PM  Result Value Ref Range   Glucose-Capillary 223 (H) 70 - 99 mg/dL    Comment: Glucose reference range applies only to samples taken after fasting for at least 8 hours.  Glucose, capillary     Status: Abnormal   Collection Time: 04/16/24  7:35 AM  Result Value Ref Range   Glucose-Capillary 165 (H) 70 - 99 mg/dL    Comment: Glucose reference range applies only to samples taken after fasting for at least 8 hours.  Glucose, capillary     Status: Abnormal   Collection Time:  04/16/24 11:33 AM  Result Value Ref Range   Glucose-Capillary 180 (H) 70 - 99 mg/dL    Comment: Glucose reference range applies only to samples taken after fasting for at least 8 hours.  Glucose, capillary     Status: Abnormal   Collection Time: 04/16/24  4:16 PM  Result Value Ref Range   Glucose-Capillary 189 (H) 70 - 99 mg/dL    Comment: Glucose reference range applies only to samples taken after fasting for at least 8 hours.    Blood Alcohol level:  Lab Results  Component Value Date   [facility] <15 04/13/2024   ETH <15 11/01/2023    Metabolic Disorder Labs: Lab Results  Component Value Date   HGBA1C 9.4 (H) 04/15/2024   MPG 223.08 04/15/2024   MPG 143 11/01/2023   No results found for: PROLACTIN Lab Results  Component Value Date   CHOL 199 04/15/2024   TRIG 123 04/15/2024   HDL 64 04/15/2024   CHOLHDL 3.1 04/15/2024   VLDL 25 04/15/2024   LDLCALC 111 (H) 04/15/2024   LDLCALC 67 04/28/2021    Physical Findings: AIMS:  ,  ,  ,  ,  ,  ,   CIWA:    COWS:     Musculoskeletal: Strength & Muscle Tone: within normal limits Gait & Station: normal Patient leans: N/A  Psychiatric Specialty Exam:  Presentation  General Appearance:  Appropriate for Environment  Eye Contact: Fair  Speech: Clear and Coherent  Speech Volume: Normal  Handedness: Right   Mood and Affect  Mood: Depressed; Dysphoric  Affect: Appropriate   Thought Process  Thought Processes: Coherent  Descriptions of Associations:Intact  Orientation:Full (Time, Place and  Person)  Thought Content:Logical  History of Schizophrenia/Schizoaffective disorder:No  Duration of Psychotic Symptoms:No data recorded Hallucinations:No data recorded  Ideas of Reference:None  Suicidal Thoughts:No data recorded  Homicidal Thoughts:No data recorded   Sensorium  Memory: Immediate Fair  Judgment: Impaired  Insight: Shallow   Executive Functions  Concentration: Fair  Attention  Span: Fair  Recall: Fair  Fund of Knowledge: Fair  Language: Fair   Psychomotor Activity  Psychomotor Activity: No data recorded   Assets  Assets: Communication Skills; Desire for Improvement   Sleep  Sleep: No data recorded    Physical Exam: Physical Exam ROS Blood pressure 136/81, pulse 98, temperature (!) 97.4 F (36.3 C), resp. rate 19, height 5' 2 (1.575 m), weight 68.5 kg, SpO2 100%. Body mass index is 27.62 kg/m.   Treatment Plan Summary: Principal Diagnosis: <principal problem not specified> Diagnosis:  Active Problems:   MDD (major depressive disorder), recurrent episode, moderate (HCC)     Clinical Decision Making:   Treatment Plan Summary:   Safety and Monitoring:             -- Voluntary admission to inpatient psychiatric unit for safety, stabilization and treatment             -- Daily contact with patient to assess and evaluate symptoms and progress in treatment             -- Patient's case to be discussed in multi-disciplinary team meeting             -- Observation Level: q15 minute checks             -- Vital signs:  q12 hours             -- Precautions: suicide, elopement, and assault   2. Psychiatric Diagnoses and Treatment:              Lexapro  10 mg daily     -- The risks/benefits/side-effects/alternatives to this medication were discussed in detail with the patient and time was given for questions. The patient consents to medication trial.                -- Metabolic profile and EKG monitoring obtained while on an atypical antipsychotic (BMI: Lipid Panel: HbgA1c: QTc:)              -- Encouraged patient to participate in unit milieu and in scheduled group therapies                            3. Medical Issues Being Addressed:      4. Discharge Planning:              -- Social work and case management to assist with discharge planning and identification of hospital follow-up needs prior to discharge             -- Estimated  LOS: 5-7 days             -- Discharge Concerns: Need to establish a safety plan; Medication compliance and effectiveness             -- Discharge Goals: Return home with outpatient referrals follow ups   Physician Treatment Plan for Primary Diagnosis: <principal problem not specified> Long Term Goal(s): Improvement in symptoms so as ready for discharge   Short Term Goals: Ability to identify changes in lifestyle to reduce recurrence of condition will improve, Ability to verbalize feelings will improve, Ability  to disclose and discuss suicidal ideas, Ability to demonstrate self-control will improve, and Ability to identify and develop effective coping behaviors will improve   Physician Treatment Plan for Secondary Diagnosis: Active Problems:   MDD (major depressive disorder), recurrent episode, moderate (HCC)   Long Term Goal(s): Improvement in symptoms so as ready for discharge   Short Term Goals: Ability to identify changes in lifestyle to reduce recurrence of condition will improve, Ability to verbalize feelings will improve, Ability to disclose and discuss suicidal ideas, Ability to demonstrate self-control will improve, Ability to identify and develop effective coping behaviors will improve, and Ability to identify triggers associated with substance abuse/mental health issues will improve   I certify that inpatient services furnished can reasonably be expected to improve the patient's condition.   Desmond Chimera, MD 04/16/2024, 5:26 PM

## 2024-04-16 NOTE — Group Note (Signed)
 Date:  04/16/2024 Time:  11:04 AM  Group Topic/Focus:  Coping With Mental Health Crisis:   The purpose of this group is to help patients identify strategies for coping with mental health crisis.  Group discusses possible causes of crisis and ways to manage them effectively.    Participation Level:  Did Not Attend    Norleen SHAUNNA Bias 04/16/2024, 11:04 AM

## 2024-04-16 NOTE — Plan of Care (Signed)
" °  Problem: Safety: Goal: Ability to disclose and discuss suicidal ideas will improve Outcome: Progressing   Problem: Self-Concept: Goal: Level of anxiety will decrease Outcome: Progressing   Problem: Activity: Goal: Interest or engagement in leisure activities will improve Outcome: Not Progressing Goal: Imbalance in normal sleep/wake cycle will improve Outcome: Not Progressing   Problem: Coping: Goal: Coping ability will improve Outcome: Not Progressing   Problem: Role Relationship: Goal: Will demonstrate positive changes in social behaviors and relationships Outcome: Not Progressing   "

## 2024-04-16 NOTE — Progress Notes (Signed)
" °   04/16/24 0309  Psych Admission Type (Psych Patients Only)  Admission Status Voluntary  Psychosocial Assessment  Patient Complaints None  Eye Contact Fair  Facial Expression Flat  Affect Appropriate to circumstance  Speech Logical/coherent  Interaction Minimal  Motor Activity Slow  Appearance/Hygiene In scrubs  Behavior Characteristics Cooperative;Calm  Mood Pleasant  Aggressive Behavior  Effect No apparent injury  Thought Process  Coherency WDL  Content WDL  Delusions None reported or observed  Perception WDL  Hallucination None reported or observed  Judgment Impaired  Confusion None  Danger to Self  Current suicidal ideation? Denies  Self-Injurious Behavior No self-injurious ideation or behavior indicators observed or expressed   Agreement Not to Harm Self Yes  Description of Agreement Verbal  Danger to Others  Danger to Others None reported or observed    "

## 2024-04-16 NOTE — Plan of Care (Signed)

## 2024-04-16 NOTE — Progress Notes (Signed)
 Patient reported not feeling well with general aches resolved by acetaminophen . Sliding scale insulin  coverage required for each meal. Little interaction with peer today. LBM 04/15/2024  04/16/24 1100  Psych Admission Type (Psych Patients Only)  Admission Status Voluntary  Psychosocial Assessment  Patient Complaints None  Eye Contact Fair  Facial Expression Flat  Affect Appropriate to circumstance  Speech Logical/coherent  Interaction Minimal  Motor Activity Slow  Appearance/Hygiene In scrubs  Behavior Characteristics Cooperative  Mood Pleasant  Thought Process  Coherency WDL  Content WDL  Delusions None reported or observed  Perception WDL  Hallucination None reported or observed  Judgment Impaired  Confusion None  Danger to Self  Current suicidal ideation? Denies  Self-Injurious Behavior No self-injurious ideation or behavior indicators observed or expressed   Agreement Not to Harm Self Yes  Description of Agreement verbal  Danger to Others  Danger to Others None reported or observed

## 2024-04-16 NOTE — Group Note (Signed)
 Date:  04/16/2024 Time:  9:56 PM  Group Topic/Focus:  Wrap-Up Group:   The focus of this group is to help patients review their daily goal of treatment and discuss progress on daily workbooks.    Participation Level:  Did Not Attend  Participation Quality:     Affect:     Cognitive:     Insight: None  Engagement in Group:  None  Modes of Intervention:     Additional Comments:    Tommas [name] 04/16/2024, 9:56 PM

## 2024-04-16 NOTE — Group Note (Signed)
 Date:  04/16/2024 Time:  3:56 PM  Group Topic/Focus:  Karaoke Group: The purpose of this group is for patients to interact with each other while singing their favorite songs.     Participation Level:  Active  Participation Quality:  Appropriate  Affect:  Appropriate  Cognitive:  Appropriate  Insight: Appropriate  Engagement in Group:  Engaged  Modes of Intervention:  Activity  Additional Comments:    [name] 04/16/2024, 3:56 PM

## 2024-04-17 DIAGNOSIS — F331 Major depressive disorder, recurrent, moderate: Secondary | ICD-10-CM | POA: Diagnosis not present

## 2024-04-17 LAB — GLUCOSE, CAPILLARY
Glucose-Capillary: 133 mg/dL — ABNORMAL HIGH (ref 70–99)
Glucose-Capillary: 223 mg/dL — ABNORMAL HIGH (ref 70–99)
Glucose-Capillary: 234 mg/dL — ABNORMAL HIGH (ref 70–99)
Glucose-Capillary: 232 mg/dL — ABNORMAL HIGH (ref 70–99)

## 2024-04-17 NOTE — Progress Notes (Signed)
 [place] MD Progress Note  04/17/2024 12:39 PM Kelly Doyle  MRN:  [id number] Chief Complaint:  MDD (major depressive disorder) [F32.9]   patient presents with a history of recurrent depression, now presenting with suicidal ideation.  Patient has become estranged from her daughter, who live locally.  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  Even there, however, she had interpersonal issues. Earlier today she spoke with her son, who is moving from [place] to [place] to help care for patient, and she told him that she was wanting to kill herself.Patient is admitted to [facility] unit with Q15 min safety monitoring. Multidisciplinary team approach is offered. Medication management; group/milieu therapy is offered.   Subjective:   Staff report the patient is doing good.  No behavior issues reported.  This clinical research associate met with the patient twice first morning when we saw the patient and later during the treatment team also.  Patient today reports that she is doing fine.  Denies feeling depressed or having any suicidal homicidal ideations.  Reports that she talked to her son and her son is trying to help her to find a place. Patient reports that her son want to talk to this provider to get an update.  Patient did not report any headache today.  She gave permission to talk to the son.  Son's number is [phone]: Call patient's son son reports that he is looking safe place for her mother to stay.  Answered all questions.   Principal Problem: <principal problem not specified> Diagnosis: Active Problems:   MDD (major depressive disorder), recurrent episode, moderate (HCC)   Past Psychiatric History: se hpi  Past Medical History:  Past Medical History:  Diagnosis Date   Anxiety    Arthritis    Dementia (HCC)    per son on 12/25   Diabetes mellitus without complication (HCC)    GERD  (gastroesophageal reflux disease)    Hypertension     Past Surgical History:  Procedure Laterality Date   COLONOSCOPY WITH PROPOFOL  N/A 06/23/2017   Procedure: COLONOSCOPY WITH PROPOFOL ;  Surgeon: Toledo, Ladell MARLA, MD;  Location: [facility] ENDOSCOPY;  Service: Gastroenterology;  Laterality: N/A;   DILATION AND CURETTAGE OF UTERUS  04/20/1973   NO PAST SURGERIES     TUBAL LIGATION     Family History:  Family History  Problem Relation Age of Onset   Alzheimer's disease Mother    Hypertension Mother    Diabetes Sister    Gout Brother     Social History:  Social History   Substance and Sexual Activity  Alcohol Use No     Social History   Substance and Sexual Activity  Drug Use No    Social History   Socioeconomic History   Marital status: Single    Spouse name: Not on file   Number of children: Not on file   Years of education: Not on file   Highest education level: Not on file  Occupational History   Not on file  Tobacco Use   Smoking status: Never   Smokeless tobacco: Never  Vaping Use   Vaping status: Never Used  Substance and Sexual Activity   Alcohol use: No   Drug use: No   Sexual activity: Yes    Birth control/protection: None  Other Topics Concern   Not on file  Social History Narrative  Not on file   Social Drivers of Health   Tobacco Use: Low Risk (04/14/2024)   Patient History    Smoking Tobacco Use: Never    Smokeless Tobacco Use: Never    Passive Exposure: Not on file  Financial Resource Strain: Not on file  Food Insecurity: No Food Insecurity (04/14/2024)   Epic    Worried About Programme Researcher, Broadcasting/film/video in the Last Year: Never true    Ran Out of Food in the Last Year: Never true  Transportation Needs: No Transportation Needs (04/14/2024)   Epic    Lack of Transportation (Medical): No    Lack of Transportation (Non-Medical): No  Physical Activity: Not on file  Stress: Not on file  Social Connections: Socially Isolated (04/14/2024)   Social  Connection and Isolation Panel    Frequency of Communication with Friends and Family: Once a week    Frequency of Social Gatherings with Friends and Family: Never    Attends Religious Services: Never    Database Administrator or Organizations: Yes    Attends Banker Meetings: Never    Marital Status: Never married  Depression (PHQ2-9): Not on file  Alcohol Screen: Low Risk (04/14/2024)   Alcohol Screen    Last Alcohol Screening Score (AUDIT): 0  Housing: High Risk (04/14/2024)   Epic    Unable to Pay for Housing in the Last Year: No    Number of Times Moved in the Last Year: 2    Homeless in the Last Year: Yes  Utilities: Not At Risk (04/14/2024)   Epic    Threatened with loss of utilities: No  Health Literacy: Not on file   Additional Social History:                         Sleep: Fair Estimated Sleeping Duration (Last 24 Hours): 13.00-15.00 hours  Appetite:  Fair  Current Medications: Current Facility-Administered Medications  Medication Dose Route Frequency Provider Last Rate Last Admin   acetaminophen  (TYLENOL ) tablet 650 mg  650 mg Oral Q6H PRN Smith, Annie B, NP   650 mg at 04/17/24 0917   alum & mag hydroxide-simeth (MAALOX/MYLANTA) 200-200-20 MG/5ML suspension 30 mL  30 mL Oral Q4H PRN Smith, Annie B, NP       amLODipine  (NORVASC ) tablet 10 mg  10 mg Oral Daily Smith, Annie B, NP   10 mg at 04/17/24 1015   atorvastatin  (LIPITOR) tablet 40 mg  40 mg Oral Daily Smith, Annie B, NP   40 mg at 04/17/24 1015   empagliflozin  (JARDIANCE ) tablet 25 mg  25 mg Oral Daily Smith, Annie B, NP   25 mg at 04/17/24 1015   escitalopram  (LEXAPRO ) tablet 10 mg  10 mg Oral Daily Smith, Annie B, NP   10 mg at 04/17/24 1015   insulin  aspart (novoLOG ) injection 0-15 Units  0-15 Units Subcutaneous TID WC Smith, Annie B, NP   5 Units at 04/17/24 1227   insulin  glargine (LANTUS ) injection 15 Units  15 Units Subcutaneous QHS Donnelly Mellow, MD   15 Units at 04/16/24 2149    lisinopril  (ZESTRIL ) tablet 40 mg  40 mg Oral Daily Smith, Annie B, NP   40 mg at 04/17/24 1015   magnesium  hydroxide (MILK OF MAGNESIA) suspension 30 mL  30 mL Oral Daily PRN Smith, Annie B, NP       OLANZapine  (ZYPREXA ) injection 5 mg  5 mg Intramuscular TID PRN Smith, Annie B,  NP       OLANZapine  zydis (ZYPREXA ) disintegrating tablet 5 mg  5 mg Oral TID PRN Smith, Annie B, NP       pantoprazole  (PROTONIX ) EC tablet 40 mg  40 mg Oral Daily Smith, Annie B, NP   40 mg at 04/17/24 1015    Lab Results:  Results for orders placed or performed during the hospital encounter of 04/14/24 (from the past 48 hours)  Glucose, capillary     Status: Abnormal   Collection Time: 04/15/24  4:24 PM  Result Value Ref Range   Glucose-Capillary 223 (H) 70 - 99 mg/dL    Comment: Glucose reference range applies only to samples taken after fasting for at least 8 hours.  Glucose, capillary     Status: Abnormal   Collection Time: 04/16/24  7:35 AM  Result Value Ref Range   Glucose-Capillary 165 (H) 70 - 99 mg/dL    Comment: Glucose reference range applies only to samples taken after fasting for at least 8 hours.  Glucose, capillary     Status: Abnormal   Collection Time: 04/16/24 11:33 AM  Result Value Ref Range   Glucose-Capillary 180 (H) 70 - 99 mg/dL    Comment: Glucose reference range applies only to samples taken after fasting for at least 8 hours.  Glucose, capillary     Status: Abnormal   Collection Time: 04/16/24  4:16 PM  Result Value Ref Range   Glucose-Capillary 189 (H) 70 - 99 mg/dL    Comment: Glucose reference range applies only to samples taken after fasting for at least 8 hours.  Glucose, capillary     Status: Abnormal   Collection Time: 04/16/24  7:22 PM  Result Value Ref Range   Glucose-Capillary 184 (H) 70 - 99 mg/dL    Comment: Glucose reference range applies only to samples taken after fasting for at least 8 hours.  Glucose, capillary     Status: Abnormal   Collection Time: 04/17/24   7:43 AM  Result Value Ref Range   Glucose-Capillary 133 (H) 70 - 99 mg/dL    Comment: Glucose reference range applies only to samples taken after fasting for at least 8 hours.  Glucose, capillary     Status: Abnormal   Collection Time: 04/17/24 11:10 AM  Result Value Ref Range   Glucose-Capillary 223 (H) 70 - 99 mg/dL    Comment: Glucose reference range applies only to samples taken after fasting for at least 8 hours.    Blood Alcohol level:  Lab Results  Component Value Date   [facility] <15 04/13/2024   ETH <15 11/01/2023    Metabolic Disorder Labs: Lab Results  Component Value Date   HGBA1C 9.4 (H) 04/15/2024   MPG 223.08 04/15/2024   MPG 143 11/01/2023   No results found for: PROLACTIN Lab Results  Component Value Date   CHOL 199 04/15/2024   TRIG 123 04/15/2024   HDL 64 04/15/2024   CHOLHDL 3.1 04/15/2024   VLDL 25 04/15/2024   LDLCALC 111 (H) 04/15/2024   LDLCALC 67 04/28/2021    Physical Findings: AIMS:  ,  ,  ,  ,  ,  ,   CIWA:    COWS:     Musculoskeletal: Strength & Muscle Tone: within normal limits Gait & Station: normal Patient leans: N/A  Psychiatric Specialty Exam:  Presentation  General Appearance:  Appropriate for Environment  Eye Contact: Fair  Speech: Clear and Coherent  Speech Volume: Normal  Handedness: Right   Mood  and Affect  Mood: Depressed; Dysphoric  Affect: Appropriate   Thought Process  Thought Processes: Coherent  Descriptions of Associations:Intact  Orientation:Full (Time, Place and Person)  Thought Content:Logical  History of Schizophrenia/Schizoaffective disorder:No  Duration of Psychotic Symptoms:No data recorded Hallucinations:No data recorded  Ideas of Reference:None  Suicidal Thoughts: Denies none  Homicidal Thoughts: Denies, none   Sensorium  Memory: Immediate Fair  Judgment: Fair  Insight: Fair   Art Therapist  Concentration: Fair  Attention  Span: Fair  Recall: [employer] of Knowledge: Fair  Language: Fair   Psychomotor Activity  Psychomotor Activity: No data recorded   Assets  Assets: Communication Skills; Desire for Improvement   Sleep  Sleep: No data recorded    Physical Exam: Physical Exam ROS Blood pressure 117/74, pulse (!) 103, temperature 98.1 F (36.7 C), resp. rate 17, height 5' 2 (1.575 m), weight 68.5 kg, SpO2 99%. Body mass index is 27.62 kg/m.   Treatment Plan Summary: Principal Diagnosis: <principal problem not specified> Diagnosis:  Active Problems:   MDD (major depressive disorder), recurrent episode, moderate (HCC)     Clinical Decision Making:   Treatment Plan Summary:   Safety and Monitoring:             -- Voluntary admission to inpatient psychiatric unit for safety, stabilization and treatment             -- Daily contact with patient to assess and evaluate symptoms and progress in treatment             -- Patient's case to be discussed in multi-disciplinary team meeting             -- Observation Level: q15 minute checks             -- Vital signs:  q12 hours             -- Precautions: suicide, elopement, and assault   2. Psychiatric Diagnoses and Treatment:              Lexapro  10 mg daily     -- The risks/benefits/side-effects/alternatives to this medication were discussed in detail with the patient and time was given for questions. The patient consents to medication trial.                -- Metabolic profile and EKG monitoring obtained while on an atypical antipsychotic (BMI: Lipid Panel: HbgA1c: QTc:)              -- Encouraged patient to participate in unit milieu and in scheduled group therapies                            3. Medical Issues Being Addressed:  Hypertension-continue amlodipine  10 mg daily.  Lisinopril  40 Hyperlipidemia-continue Lipitor 40 mg daily. Diabetes-on Jardiance , insulin  sliding scale, Lantus  15 units at bedtime. GERD-PPI   4.  Discharge Planning:              -- Social work and case management to assist with discharge planning and identification of hospital follow-up needs prior to discharge             -- Estimated LOS: 5-7 days             -- Discharge Concerns: Need to establish a safety plan; Medication compliance and effectiveness             -- Discharge Goals: Return home with outpatient referrals follow ups  Physician Treatment Plan for Primary Diagnosis: <principal problem not specified> Long Term Goal(s): Improvement in symptoms so as ready for discharge   Short Term Goals: Ability to identify changes in lifestyle to reduce recurrence of condition will improve, Ability to verbalize feelings will improve, Ability to disclose and discuss suicidal ideas, Ability to demonstrate self-control will improve, and Ability to identify and develop effective coping behaviors will improve   Physician Treatment Plan for Secondary Diagnosis: Active Problems:   MDD (major depressive disorder), recurrent episode, moderate (HCC)   Long Term Goal(s): Improvement in symptoms so as ready for discharge   Short Term Goals: Ability to identify changes in lifestyle to reduce recurrence of condition will improve, Ability to verbalize feelings will improve, Ability to disclose and discuss suicidal ideas, Ability to demonstrate self-control will improve, Ability to identify and develop effective coping behaviors will improve, and Ability to identify triggers associated with substance abuse/mental health issues will improve   I certify that inpatient services furnished can reasonably be expected to improve the patient's condition.   Desmond Chimera, MD 04/17/2024, 12:39 PM

## 2024-04-17 NOTE — Group Note (Signed)
 Date:  04/17/2024 Time:  11:23 PM  Group Topic/Focus:  Wrap-Up Group:   The focus of this group is to help patients review their daily goal of treatment and discuss progress on daily workbooks.    Participation Level:  Did Not Attend  Participation Quality:     Affect:     Cognitive:     Insight: None  Engagement in Group:  None  Modes of Intervention:     Additional Comments:    Tommas [name] 04/17/2024, 11:23 PM

## 2024-04-17 NOTE — Progress Notes (Signed)
 Behavior:  Pleasant and cooperative.   Psych assessment:  Denies SI/HI and AVH.    Interaction / Group attendance:   Present in the milieu.  Appropriate interaction with peers and staff.  Attends some groups.  Medication/ PRNs: Complaint.   Pain: Denies  15 min checks in place for safety.

## 2024-04-17 NOTE — Group Note (Signed)
 Date:  04/17/2024 Time:  10:42 AM  Group Topic/Focus:    For geriatric mental health patients, effective exercises combine gentle movement, mind-body practices, and social engagement, focusing on activities like walking in nature, yoga, Tai Chi, water aerobics, and light strength training, which boost mood, reduce anxiety, improve sleep, and fight cognitive decline by increasing blood flow and endorphins, while also providing structure and purpose. Small amounts of daily activity (even 10 minutes) help, with a mix of aerobic, balance, and strength exercises recommended for overall well-being.    Participation Level:  Did Not Attend  Harlene LITTIE Gavel 04/17/2024, 10:42 AM

## 2024-04-17 NOTE — Plan of Care (Signed)

## 2024-04-17 NOTE — Plan of Care (Signed)
   Problem: Education: Goal: Knowledge of the prescribed therapeutic regimen will improve Outcome: Progressing   Problem: Activity: Goal: Interest or engagement in leisure activities will improve Outcome: Progressing   Problem: Coping: Goal: Coping ability will improve Outcome: Progressing

## 2024-04-17 NOTE — [facility] Suicide Risk Assessment (Signed)
 [facility] INPATIENT:  Family/Significant Other Suicide Prevention Education  Suicide Prevention Education:  Education Completed; [place] Omary,son,[phone] ,  (name of family member/significant other) has been identified by the patient as the family member/significant other with whom the patient will be residing, and identified as the person(s) who will aid the patient in the event of a mental health crisis (suicidal ideations/suicide attempt).  With written consent from the patient, the family member/significant other has been provided the following suicide prevention education, prior to the and/or following the discharge of the patient.  The suicide prevention education provided includes the following: Suicide risk factors Suicide prevention and interventions National Suicide Hotline telephone number [facility] assessment telephone number [facility] Emergency Assistance 911 [facility] and/or Residential Mobile Crisis Unit telephone number  Request made of family/significant other to: Remove weapons (e.g., guns, rifles, knives), all items previously/currently identified as safety concern.   Remove drugs/medications (over-the-counter, prescriptions, illicit drugs), all items previously/currently identified as a safety concern.  The family member/significant other verbalizes understanding of the suicide prevention education information provided.  The family member/significant other agrees to remove the items of safety concern listed above.  Pt's son reports that he brought her to the hospital to get a full physical and because she had stated to him that she had wanted to pass away. Pt's son reports he just moved to [place] from [place], and that he will be taking care of pt and he wanted to make sure what kind of pills and medicine she should be on. Pt's son reports that since he had been in [place], he had not seen pt take any pills and he was concerned because he  knows that pt has medical conditions.   Pt's son reports that he feels pt was suicidal because she did not have anywhere to go, reports that is why he moved down here, reports he is currently looking for her somewhere permanent to live. Pt's son reports pt's other family has given up on her but report he is her son and reports he will care for her.   Pt's son denies pt having access to weapons.   Pt's son request call from provider regarding pt's medication. CSW informed provider.   Lum [name] 04/17/2024, 10:18 AM

## 2024-04-17 NOTE — Progress Notes (Signed)
" °   04/17/24 0243  Psych Admission Type (Psych Patients Only)  Admission Status Voluntary  Psychosocial Assessment  Patient Complaints None  Eye Contact Fair  Facial Expression Flat  Affect Appropriate to circumstance  Speech Logical/coherent  Interaction Minimal  Motor Activity Slow  Appearance/Hygiene In scrubs  Behavior Characteristics Cooperative;Calm  Mood Pleasant  Aggressive Behavior  Effect No apparent injury  Thought Process  Coherency WDL  Content WDL  Delusions None reported or observed  Perception WDL  Hallucination None reported or observed  Judgment WDL  Confusion None  Danger to Self  Current suicidal ideation? Denies  Self-Injurious Behavior No self-injurious ideation or behavior indicators observed or expressed   Agreement Not to Harm Self Yes  Description of Agreement Verbal  Danger to Others  Danger to Others None reported or observed    "

## 2024-04-17 NOTE — Group Note (Signed)
 Recreation Therapy Group Note   Group Topic:Coping Skills  Group Date: 04/17/2024 Start Time: 1415 End Time: 1440 Facilitators: Celestia Jeoffrey BRAVO, LRT, CTRS Location: Courtyard  Group Description: Music. Patients are encouraged to name their favorite song(s) for LRT to play song through speaker for group to hear, while in the courtyard getting fresh air and sunlight. Patients educated on the definition of leisure and the importance of having different leisure interests outside of the hospital. Group discussed how leisure activities can often be used as pharmacologist and that listening to music and being outside are examples.    Goal Area(s) Addressed:  Patient will identify a current leisure interest.  Patient will practice making a positive decision. Patient will have the opportunity to try a new leisure activity.   Affect/Mood: Appropriate   Participation Level: Minimal    Clinical Observations/Individualized Feedback: Kaegan came late to group and left early. Pt was present for 5 minutes.   Plan: Continue to engage patient in RT group sessions 2-3x/week.   Jeoffrey BRAVO Celestia, LRT, CTRS 04/17/2024 4:42 PM

## 2024-04-17 NOTE — Group Note (Signed)
 Physical/Occupational Therapy Group Note  Group Topic: Yoga  Group Date: 04/17/2024 Start Time: 1305 End Time: 1333 Facilitators: Steffen Hase, Alm Hamilton, PT   Group Description: Group participated with series of yoga poses, designed to emphasize functional sitting balance, core stability, generalized flexibility and overall posture.  Incorporated deep breathing techniques with poses, working to promote relaxation, mindfulness and focus with targeted activities.  Discussed benefits of yoga in improving mood and self-esteem, reducing stress and anxiety, and promoting functional strength, balance and core stability for each participant.  Discussed ways to integrate into each participants daily routine.  Provided handout with written and pictorial descriptions of included yoga movements to be utilized as appropriate outside of group time.  Therapeutic Goal(s):  Demonstrate safe ability to participate with yoga poses during group activity. Identify one benefit of participation with yoga poses as part of each participants exercise/movement routine. Identify 1-2 individual poses that participant feels most beneficial to his/her needs and that he/she can easily replicate outside of group.  Individual Participation: Pt actively participated with the discussion and physical activity components of the session and was able with min cuing to modify poses as needed for patient comfort  Participation Level: Active and Engaged   Participation Quality: Minimal Cues   Behavior: Alert and Appropriate   Speech/Thought Process: Coherent and Focused   Affect/Mood: Appropriate   Insight: Good   Judgement: Good   Modes of Intervention: Activity, Discussion, and Education  Patient Response to Interventions:  Attentive, Engaged, and Interested    Plan: Continue to engage patient in PT/OT groups 1 - 2x/week.  CHARM Hamilton Bertin PT, DPT 04/17/2024, 1:44 PM

## 2024-04-17 NOTE — Progress Notes (Signed)
" °   04/17/24 2000  Psych Admission Type (Psych Patients Only)  Admission Status Voluntary  Psychosocial Assessment  Patient Complaints None  Eye Contact Fair  Facial Expression Flat  Affect Appropriate to circumstance  Speech Logical/coherent  Interaction Minimal  Motor Activity Slow  Appearance/Hygiene In scrubs  Behavior Characteristics Cooperative;Appropriate to situation  Mood Pleasant  Thought Process  Coherency WDL  Content Preoccupation  Delusions None reported or observed  Perception WDL  Hallucination None reported or observed  Judgment Impaired  Confusion None  Danger to Self  Current suicidal ideation? Denies  Self-Injurious Behavior No self-injurious ideation or behavior indicators observed or expressed   Agreement Not to Harm Self Yes  Description of Agreement verbal  Danger to Others  Danger to Others None reported or observed    "

## 2024-04-17 NOTE — BH IP Treatment Plan (Signed)
 Interdisciplinary Treatment and Diagnostic Plan Update  04/17/2024 Time of Session: 11:23 Kelly Doyle MRN: [id number]  Principal Diagnosis: <principal problem not specified>  Secondary Diagnoses: Active Problems:   MDD (major depressive disorder), recurrent episode, moderate (HCC)   Current Medications:  Current Facility-Administered Medications  Medication Dose Route Frequency Provider Last Rate Last Admin   acetaminophen  (TYLENOL ) tablet 650 mg  650 mg Oral Q6H PRN Smith, Annie B, NP   650 mg at 04/17/24 0917   alum & mag hydroxide-simeth (MAALOX/MYLANTA) 200-200-20 MG/5ML suspension 30 mL  30 mL Oral Q4H PRN Smith, Annie B, NP       amLODipine  (NORVASC ) tablet 10 mg  10 mg Oral Daily Smith, Annie B, NP   10 mg at 04/17/24 1015   atorvastatin  (LIPITOR) tablet 40 mg  40 mg Oral Daily Smith, Annie B, NP   40 mg at 04/17/24 1015   empagliflozin  (JARDIANCE ) tablet 25 mg  25 mg Oral Daily Smith, Annie B, NP   25 mg at 04/17/24 1015   escitalopram  (LEXAPRO ) tablet 10 mg  10 mg Oral Daily Smith, Annie B, NP   10 mg at 04/17/24 1015   insulin  aspart (novoLOG ) injection 0-15 Units  0-15 Units Subcutaneous TID WC Smith, Annie B, NP   2 Units at 04/17/24 0835   insulin  glargine (LANTUS ) injection 15 Units  15 Units Subcutaneous QHS Jadapalle, Sree, MD   15 Units at 04/16/24 2149   lisinopril  (ZESTRIL ) tablet 40 mg  40 mg Oral Daily Smith, Annie B, NP   40 mg at 04/17/24 1015   magnesium  hydroxide (MILK OF MAGNESIA) suspension 30 mL  30 mL Oral Daily PRN Smith, Annie B, NP       OLANZapine  (ZYPREXA ) injection 5 mg  5 mg Intramuscular TID PRN Smith, Annie B, NP       OLANZapine  zydis (ZYPREXA ) disintegrating tablet 5 mg  5 mg Oral TID PRN Smith, Annie B, NP       pantoprazole  (PROTONIX ) EC tablet 40 mg  40 mg Oral Daily Smith, Annie B, NP   40 mg at 04/17/24 1015   PTA Medications: Medications Prior to Admission  Medication Sig Dispense Refill Last Dose/Taking   acetaminophen  (TYLENOL ) 650  MG CR tablet SMARTSIG:1-2 Tablet(s) By Mouth Every 8 Hours PRN (Patient not taking: Reported on 11/02/2023)      amLODipine  (NORVASC ) 10 MG tablet Take 1 tablet (10 mg total) by mouth daily. 30 tablet 2    atorvastatin  (LIPITOR) 40 MG tablet Take 1 tablet (40 mg total) by mouth daily. 90 tablet 1    baclofen (LIORESAL) 10 MG tablet Take 10 mg by mouth daily as needed.      BD PEN NEEDLE NANO 2ND GEN 32G X 4 MM MISC 3 (three) times daily. :3 Times Daily      blood glucose meter kit and supplies KIT Dispense based on patient and insurance preference. Use up to four times daily as directed. 1 each 0    blood glucose meter kit and supplies Dispense based on patient and insurance preference. Use up to four times daily as directed. (FOR ICD-10 E10.9, E11.9). 1 each 0    empagliflozin  (JARDIANCE ) 25 MG TABS tablet Take 1 tablet (25 mg total) by mouth daily. (Patient not taking: Reported on 04/14/2024) 30 tablet 0    escitalopram  (LEXAPRO ) 10 MG tablet Take 10 mg by mouth daily.      insulin  lispro (HUMALOG ) 100 UNIT/ML KwikPen Inject 0-9 units into the skin  3 (three) times daily with meals and inject 0-5 units into the skin at bedtime. (Patient not taking: Reported on 04/14/2024) 15 mL 11    insulin  aspart (NOVOLOG ) 100 UNIT/ML injection Inject 0-9 Units into the skin 3 (three) times daily with meals. (Patient not taking: Reported on 04/14/2024) 10 mL 11    Insulin  Pen Needle (INSUPEN PEN NEEDLES) 31G X 5 MM MISC Use 1 each 4 (four) times daily with insulin . 100 each 0    LANTUS  SOLOSTAR 100 UNIT/ML Solostar Pen Inject 30 Units into the skin daily.      lisinopril  (ZESTRIL ) 40 MG tablet Take 1 tablet (40 mg total) by mouth daily. 30 tablet 2    OZEMPIC, 0.25 OR 0.5 MG/DOSE, 2 MG/3ML SOPN Inject 0.25 mg into the skin once a week.      pantoprazole  (PROTONIX ) 40 MG tablet Take 1 tablet (40 mg total) by mouth daily. (Patient not taking: Reported on 04/14/2024) 30 tablet 0    sertraline  (ZOLOFT ) 50 MG tablet  Take 1 tablet (50 mg total) by mouth daily. (Patient not taking: Reported on 04/14/2024) 30 tablet 0    traZODone  (DESYREL ) 50 MG tablet Take 1 tablet (50 mg total) by mouth at bedtime. (Patient not taking: Reported on 04/14/2024) 30 tablet 0    VICTOZA 18 MG/3ML SOPN Inject into the skin. (Patient not taking: Reported on 11/02/2023)       Patient Stressors:    Patient Strengths:    Treatment Modalities: Medication Management, Group therapy, Case management,  1 to 1 session with clinician, Psychoeducation, Recreational therapy.   Physician Treatment Plan for Primary Diagnosis: <principal problem not specified> Long Term Goal(s): Improvement in symptoms so as ready for discharge   Short Term Goals: Ability to identify changes in lifestyle to reduce recurrence of condition will improve Ability to verbalize feelings will improve Ability to disclose and discuss suicidal ideas Ability to demonstrate self-control will improve Ability to identify and develop effective coping behaviors will improve Ability to identify triggers associated with substance abuse/mental health issues will improve  Medication Management: Evaluate patient's response, side effects, and tolerance of medication regimen.  Therapeutic Interventions: 1 to 1 sessions, Unit Group sessions and Medication administration.  Evaluation of Outcomes: Progressing  Physician Treatment Plan for Secondary Diagnosis: Active Problems:   MDD (major depressive disorder), recurrent episode, moderate (HCC)  Long Term Goal(s): Improvement in symptoms so as ready for discharge   Short Term Goals: Ability to identify changes in lifestyle to reduce recurrence of condition will improve Ability to verbalize feelings will improve Ability to disclose and discuss suicidal ideas Ability to demonstrate self-control will improve Ability to identify and develop effective coping behaviors will improve Ability to identify triggers associated with  substance abuse/mental health issues will improve     Medication Management: Evaluate patient's response, side effects, and tolerance of medication regimen.  Therapeutic Interventions: 1 to 1 sessions, Unit Group sessions and Medication administration.  Evaluation of Outcomes: Progressing   RN Treatment Plan for Primary Diagnosis: <principal problem not specified> Long Term Goal(s): Knowledge of disease and therapeutic regimen to maintain health will improve  Short Term Goals: Ability to remain free from injury will improve, Ability to verbalize frustration and anger appropriately will improve, Ability to demonstrate self-control, Ability to participate in decision making will improve, Ability to verbalize feelings will improve, Ability to disclose and discuss suicidal ideas, Ability to identify and develop effective coping behaviors will improve, and Compliance with prescribed medications will improve  Medication  Management: RN will administer medications as ordered by provider, will assess and evaluate patient's response and provide education to patient for prescribed medication. RN will report any adverse and/or side effects to prescribing provider.  Therapeutic Interventions: 1 on 1 counseling sessions, Psychoeducation, Medication administration, Evaluate responses to treatment, Monitor vital signs and CBGs as ordered, Perform/monitor CIWA, COWS, AIMS and Fall Risk screenings as ordered, Perform wound care treatments as ordered.  Evaluation of Outcomes: Progressing   LCSW Treatment Plan for Primary Diagnosis: <principal problem not specified> Long Term Goal(s): Safe transition to appropriate next level of care at discharge, Engage patient in therapeutic group addressing interpersonal concerns.  Short Term Goals: Engage patient in aftercare planning with referrals and resources, Increase social support, Increase ability to appropriately verbalize feelings, Increase emotional regulation,  Facilitate acceptance of mental health diagnosis and concerns, Identify triggers associated with mental health/substance abuse issues, and Increase skills for wellness and recovery  Therapeutic Interventions: Assess for all discharge needs, 1 to 1 time with Social worker, Explore available resources and support systems, Assess for adequacy in community support network, Educate family and significant other(s) on suicide prevention, Complete Psychosocial Assessment, Interpersonal group therapy.  Evaluation of Outcomes: Progressing   Progress in Treatment: Attending groups: No. Participating in groups: No. Taking medication as prescribed: Yes. Toleration medication: Yes. Family/Significant other contact made: Yes, individual(s) contacted:  son, Durwood Dain.  Patient understands diagnosis: Yes. Discussing patient identified problems/goals with staff: Yes. Medical problems stabilized or resolved: Yes. Denies suicidal/homicidal ideation: Yes. Issues/concerns per patient self-inventory: No. Other: none.   New problem(s) identified: No, Describe:  none identified.  New Short Term/Long Term Goal(s): medication management for mood stabilization; elimination of SI thoughts; development of comprehensive mental wellness plan.  Patient Goals:  Looking for somewhere to go.   Discharge Plan or Barriers: CSW will assist pt with development of an appropriate aftercare/discharge plan.   Reason for Continuation of Hospitalization: Anxiety Medication stabilization Suicidal ideation  Estimated Length of Stay: 1-7 days  Last 3 [place] Suicide Severity Risk Score: Flowsheet Row Admission (Current) from 04/14/2024 in [facility] BE[facility]AL MEDICINE ED from 04/13/2024 in [facility] Emergency Department at [facility] Admission (Discharged) from 11/02/2023 in [facility]  C-SSRS RISK CATEGORY Low Risk High Risk Error: Question 6 not populated    Last [facility] 2/9  Scores:     No data to display          Scribe for Treatment Team: Nadara JONELLE Fam, LCSW 04/17/2024 11:45 AM

## 2024-04-18 DIAGNOSIS — F331 Major depressive disorder, recurrent, moderate: Secondary | ICD-10-CM | POA: Diagnosis not present

## 2024-04-18 LAB — GLUCOSE, CAPILLARY
Glucose-Capillary: 265 mg/dL — ABNORMAL HIGH (ref 70–99)
Glucose-Capillary: 183 mg/dL — ABNORMAL HIGH (ref 70–99)
Glucose-Capillary: 172 mg/dL — ABNORMAL HIGH (ref 70–99)

## 2024-04-18 NOTE — Progress Notes (Signed)
(  Sleep Hours) - 9.50 (Any PRNs that were needed, meds refused, or side effects to meds)- None reported/observed (Any disturbances and when (visitation, over night)-None reported/observed (Concerns raised by the patient)- None reported (SI/HI/AVH)-Denies

## 2024-04-18 NOTE — Group Note (Signed)
 Date:  04/18/2024 Time:  8:36 PM  Group Topic/Focus:  Wrap-Up Group:   The focus of this group is to help patients review their daily goal of treatment and discuss progress on daily workbooks.    Participation Level:  Did Not Attend  Kelly Doyle 04/18/2024, 8:36 PM

## 2024-04-18 NOTE — Group Note (Signed)
 LCSW Group Therapy Note   Group Date: 04/18/2024 Start Time: 1330 End Time: 1400   Type of Therapy and Topic:  Group Therapy: Challenging Core Beliefs  Participation Level:  Active  Description of Group:  Patients were educated about core beliefs and asked to identify one harmful core belief that they have. Patients were asked to explore from where those beliefs originate. Patients were asked to discuss how those beliefs make them feel and the resulting behaviors of those beliefs. They were then be asked if those beliefs are true and, if so, what evidence they have to support them. Lastly, group members were challenged to replace those negative core beliefs with helpful beliefs.   Therapeutic Goals:   1. Patient will identify harmful core beliefs and explore the origins of such beliefs. 2. Patient will identify feelings and behaviors that result from those core beliefs. 3. Patient will discuss whether such beliefs are true. 4.  Patient will replace harmful core beliefs with helpful ones.  Summary of Patient Progress:  Kelly Doyle actively engaged in processing and exploring how core beliefs are formed and how they impact thoughts, feelings, and behaviors. Patient proved open to input from peers and feedback from CSW. Patient demonstrated fair insight into the subject matter, was respectful and supportive of peers, and participated throughout the entire session.  Therapeutic Modalities: Cognitive Behavioral Therapy; Solution-Focused Therapy   Jeanpaul Biehl D Amerah Puleo, [place] 04/18/2024  1:58 PM

## 2024-04-18 NOTE — Progress Notes (Signed)
 [facility] MD Progress Note  04/18/2024 12:35 PM Kelly Doyle  MRN:  [id number] Chief Complaint:  MDD (major depressive disorder) [F32.9]   patient presents with a history of recurrent depression, now presenting with suicidal ideation.  Patient has become estranged from her daughter, who live locally.  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  Even there, however, she had interpersonal issues. Earlier today she spoke with her son, who is moving from [place] to [place] to help care for patient, and she told him that she was wanting to kill herself.Patient is admitted to [facility] unit with Q15 min safety monitoring. Multidisciplinary team approach is offered. Medication management; group/milieu therapy is offered.   Subjective:   Per nursing report patient is doing well.  Blood sugar was 141.  No other concerns reported.  Patient slept for 9.5 hours.  Patient on interview denies any suicide homicidal ideations or any auditory visual hallucination.  Denies any side effects of medication.  No headache no diarrhea reported.  Have talked to the patient's son yesterday and updated patient that I have spoken with her son as per her request.  Patient reports that she has talked to her son also and her son has moved from [place] to here to have her with her living condition.  Patient has no other concerns or symptoms reported.  Patient is psychiatrically at his baseline and child psychotherapist working on discharge plan  Principal Problem: <principal problem not specified> Diagnosis: Active Problems:   MDD (major depressive disorder), recurrent episode, moderate (HCC)   Past Psychiatric History: se hpi  Past Medical History:  Past Medical History:  Diagnosis Date   Anxiety    Arthritis    Dementia (HCC)    per son on 12/25   Diabetes mellitus without complication (HCC)    GERD (gastroesophageal  reflux disease)    Hypertension     Past Surgical History:  Procedure Laterality Date   COLONOSCOPY WITH PROPOFOL  N/A 06/23/2017   Procedure: COLONOSCOPY WITH PROPOFOL ;  Surgeon: Toledo, Ladell MARLA, MD;  Location: [facility] ENDOSCOPY;  Service: Gastroenterology;  Laterality: N/A;   DILATION AND CURETTAGE OF UTERUS  04/20/1973   NO PAST SURGERIES     TUBAL LIGATION     Family History:  Family History  Problem Relation Age of Onset   Alzheimer's disease Mother    Hypertension Mother    Diabetes Sister    Gout Brother     Social History:  Social History   Substance and Sexual Activity  Alcohol Use No     Social History   Substance and Sexual Activity  Drug Use No    Social History   Socioeconomic History   Marital status: Single    Spouse name: Not on file   Number of children: Not on file   Years of education: Not on file   Highest education level: Not on file  Occupational History   Not on file  Tobacco Use   Smoking status: Never   Smokeless tobacco: Never  Vaping Use   Vaping status: Never Used  Substance and Sexual Activity   Alcohol use: No   Drug use: No   Sexual activity: Yes    Birth control/protection: None  Other Topics Concern   Not on file  Social History Narrative   Not on file   Social Drivers of  Health   Tobacco Use: Low Risk (04/14/2024)   Patient History    Smoking Tobacco Use: Never    Smokeless Tobacco Use: Never    Passive Exposure: Not on file  Financial Resource Strain: Not on file  Food Insecurity: No Food Insecurity (04/14/2024)   Epic    Worried About Programme Researcher, Broadcasting/film/video in the Last Year: Never true    Ran Out of Food in the Last Year: Never true  Transportation Needs: No Transportation Needs (04/14/2024)   Epic    Lack of Transportation (Medical): No    Lack of Transportation (Non-Medical): No  Physical Activity: Not on file  Stress: Not on file  Social Connections: Socially Isolated (04/14/2024)   Social Connection and  Isolation Panel    Frequency of Communication with Friends and Family: Once a week    Frequency of Social Gatherings with Friends and Family: Never    Attends Religious Services: Never    Database Administrator or Organizations: Yes    Attends Banker Meetings: Never    Marital Status: Never married  Depression (PHQ2-9): Not on file  Alcohol Screen: Low Risk (04/14/2024)   Alcohol Screen    Last Alcohol Screening Score (AUDIT): 0  Housing: High Risk (04/14/2024)   Epic    Unable to Pay for Housing in the Last Year: No    Number of Times Moved in the Last Year: 2    Homeless in the Last Year: Yes  Utilities: Not At Risk (04/14/2024)   Epic    Threatened with loss of utilities: No  Health Literacy: Not on file   Additional Social History:                         Sleep: Fair Estimated Sleeping Duration (Last 24 Hours): 10.25-12.00 hours  Appetite:  Fair  Current Medications: Current Facility-Administered Medications  Medication Dose Route Frequency Provider Last Rate Last Admin   acetaminophen  (TYLENOL ) tablet 650 mg  650 mg Oral Q6H PRN Smith, Annie B, NP   650 mg at 04/17/24 0917   alum & mag hydroxide-simeth (MAALOX/MYLANTA) 200-200-20 MG/5ML suspension 30 mL  30 mL Oral Q4H PRN Smith, Annie B, NP       amLODipine  (NORVASC ) tablet 10 mg  10 mg Oral Daily Smith, Annie B, NP   10 mg at 04/18/24 9076   atorvastatin  (LIPITOR) tablet 40 mg  40 mg Oral Daily Smith, Annie B, NP   40 mg at 04/18/24 9076   empagliflozin  (JARDIANCE ) tablet 25 mg  25 mg Oral Daily Smith, Annie B, NP   25 mg at 04/18/24 9075   escitalopram  (LEXAPRO ) tablet 10 mg  10 mg Oral Daily Smith, Annie B, NP   10 mg at 04/18/24 9075   insulin  aspart (novoLOG ) injection 0-15 Units  0-15 Units Subcutaneous TID WC Smith, Annie B, NP   8 Units at 04/18/24 1212   insulin  glargine (LANTUS ) injection 15 Units  15 Units Subcutaneous QHS Donnelly Mellow, MD   15 Units at 04/17/24 2207   lisinopril   (ZESTRIL ) tablet 40 mg  40 mg Oral Daily Smith, Annie B, NP   40 mg at 04/18/24 9076   magnesium  hydroxide (MILK OF MAGNESIA) suspension 30 mL  30 mL Oral Daily PRN Smith, Annie B, NP       OLANZapine  (ZYPREXA ) injection 5 mg  5 mg Intramuscular TID PRN Smith, Annie B, NP       OLANZapine   zydis (ZYPREXA ) disintegrating tablet 5 mg  5 mg Oral TID PRN Smith, Annie B, NP       pantoprazole  (PROTONIX ) EC tablet 40 mg  40 mg Oral Daily Smith, Annie B, NP   40 mg at 04/18/24 9076    Lab Results:  Results for orders placed or performed during the hospital encounter of 04/14/24 (from the past 48 hours)  Glucose, capillary     Status: Abnormal   Collection Time: 04/16/24  4:16 PM  Result Value Ref Range   Glucose-Capillary 189 (H) 70 - 99 mg/dL    Comment: Glucose reference range applies only to samples taken after fasting for at least 8 hours.  Glucose, capillary     Status: Abnormal   Collection Time: 04/16/24  7:22 PM  Result Value Ref Range   Glucose-Capillary 184 (H) 70 - 99 mg/dL    Comment: Glucose reference range applies only to samples taken after fasting for at least 8 hours.  Glucose, capillary     Status: Abnormal   Collection Time: 04/17/24  7:43 AM  Result Value Ref Range   Glucose-Capillary 133 (H) 70 - 99 mg/dL    Comment: Glucose reference range applies only to samples taken after fasting for at least 8 hours.  Glucose, capillary     Status: Abnormal   Collection Time: 04/17/24 11:10 AM  Result Value Ref Range   Glucose-Capillary 223 (H) 70 - 99 mg/dL    Comment: Glucose reference range applies only to samples taken after fasting for at least 8 hours.  Glucose, capillary     Status: Abnormal   Collection Time: 04/17/24  4:16 PM  Result Value Ref Range   Glucose-Capillary 234 (H) 70 - 99 mg/dL    Comment: Glucose reference range applies only to samples taken after fasting for at least 8 hours.  Glucose, capillary     Status: Abnormal   Collection Time: 04/17/24  7:48 PM   Result Value Ref Range   Glucose-Capillary 232 (H) 70 - 99 mg/dL    Comment: Glucose reference range applies only to samples taken after fasting for at least 8 hours.  Glucose, capillary     Status: Abnormal   Collection Time: 04/18/24 11:14 AM  Result Value Ref Range   Glucose-Capillary 265 (H) 70 - 99 mg/dL    Comment: Glucose reference range applies only to samples taken after fasting for at least 8 hours.    Blood Alcohol level:  Lab Results  Component Value Date   [facility] <15 04/13/2024   ETH <15 11/01/2023    Metabolic Disorder Labs: Lab Results  Component Value Date   HGBA1C 9.4 (H) 04/15/2024   MPG 223.08 04/15/2024   MPG 143 11/01/2023   No results found for: PROLACTIN Lab Results  Component Value Date   CHOL 199 04/15/2024   TRIG 123 04/15/2024   HDL 64 04/15/2024   CHOLHDL 3.1 04/15/2024   VLDL 25 04/15/2024   LDLCALC 111 (H) 04/15/2024   LDLCALC 67 04/28/2021    Physical Findings: AIMS:  ,  ,  ,  ,  ,  ,   CIWA:    COWS:     Musculoskeletal: Strength & Muscle Tone: within normal limits Gait & Station: normal Patient leans: N/A  Psychiatric Specialty Exam:  Presentation  General Appearance:  Appropriate for Environment  Eye Contact: Fair  Speech: Clear and Coherent  Speech Volume: Normal  Handedness: Right   Mood and Affect  Mood: Depressed; Dysphoric  Affect:  Appropriate   Thought Process  Thought Processes: Coherent  Descriptions of Associations:Intact  Orientation:Full (Time, Place and Person)  Thought Content:Logical  History of Schizophrenia/Schizoaffective disorder:No  Duration of Psychotic Symptoms:No data recorded Hallucinations:No data recorded  Ideas of Reference:None  Suicidal Thoughts: Denies none  Homicidal Thoughts: Denies, none   Sensorium  Memory: Immediate Fair  Judgment: Fair  Insight: Fair   Art Therapist  Concentration: Fair  Attention Span: Fair  Recall: [employer]  of Knowledge: Fair  Language: Fair   Psychomotor Activity  Psychomotor Activity: No data recorded   Assets  Assets: Communication Skills; Desire for Improvement   Sleep  Sleep: No data recorded    Physical Exam: Physical Exam ROS Blood pressure 126/60, pulse 74, temperature (!) 97.5 F (36.4 C), resp. rate 16, height 5' 2 (1.575 m), weight 68.5 kg, SpO2 99%. Body mass index is 27.62 kg/m.   Treatment Plan Summary: Principal Diagnosis: <principal problem not specified> Diagnosis:  Active Problems:   MDD (major depressive disorder), recurrent episode, moderate (HCC)     Clinical Decision Making:   Treatment Plan Summary:   Safety and Monitoring:             -- Voluntary admission to inpatient psychiatric unit for safety, stabilization and treatment             -- Daily contact with patient to assess and evaluate symptoms and progress in treatment             -- Patient's case to be discussed in multi-disciplinary team meeting             -- Observation Level: q15 minute checks             -- Vital signs:  q12 hours             -- Precautions: suicide, elopement, and assault   2. Psychiatric Diagnoses and Treatment:              Lexapro  10 mg daily     -- The risks/benefits/side-effects/alternatives to this medication were discussed in detail with the patient and time was given for questions. The patient consents to medication trial.                -- Metabolic profile and EKG monitoring obtained while on an atypical antipsychotic (BMI: Lipid Panel: HbgA1c: QTc:)              -- Encouraged patient to participate in unit milieu and in scheduled group therapies                            3. Medical Issues Being Addressed:  Hypertension-continue amlodipine  10 mg daily.  Lisinopril  40 Hyperlipidemia-continue Lipitor 40 mg daily. Diabetes-on Jardiance , insulin  sliding scale, Lantus  15 units at bedtime. GERD-PPI   4. Discharge Planning:              -- Social  work and case management to assist with discharge planning and identification of hospital follow-up needs prior to discharge             -- Estimated LOS: 5-7 days             -- Discharge Concerns: Need to establish a safety plan; Medication compliance and effectiveness             -- Discharge Goals: Return home with outpatient referrals follow ups   Physician Treatment Plan for Primary Diagnosis: <  principal problem not specified> Long Term Goal(s): Improvement in symptoms so as ready for discharge   Short Term Goals: Ability to identify changes in lifestyle to reduce recurrence of condition will improve, Ability to verbalize feelings will improve, Ability to disclose and discuss suicidal ideas, Ability to demonstrate self-control will improve, and Ability to identify and develop effective coping behaviors will improve   Physician Treatment Plan for Secondary Diagnosis: Active Problems:   MDD (major depressive disorder), recurrent episode, moderate (HCC)   Long Term Goal(s): Improvement in symptoms so as ready for discharge   Short Term Goals: Ability to identify changes in lifestyle to reduce recurrence of condition will improve, Ability to verbalize feelings will improve, Ability to disclose and discuss suicidal ideas, Ability to demonstrate self-control will improve, Ability to identify and develop effective coping behaviors will improve, and Ability to identify triggers associated with substance abuse/mental health issues will improve   I certify that inpatient services furnished can reasonably be expected to improve the patient's condition.   Desmond Chimera, MD 04/18/2024, 12:35 PM

## 2024-04-18 NOTE — Group Note (Signed)
 Recreation Therapy Group Note   Group Topic:Health and Wellness  Group Date: 04/18/2024 Start Time: 1425 End Time: 1445 Facilitators: Celestia Jeoffrey BRAVO, LRT, CTRS Location: Craft Room  Group Description: Seated Exercise. LRT discussed the mental and physical benefits of exercise. LRT and group discussed how physical activity can be used as a coping skill. Pt's and LRT followed along to an exercise video on the TV screen that provided a visual representation and audio description of every exercise performed. Pt's encouraged to listen to their bodies and stop at any time if they experience feelings of discomfort or pain. Pts were encouraged to drink water and stay hydrated.   Goal Area(s) Addressed:  Patient will learn benefits of physical activity. Patient will identify exercise as a coping skill.  Patient will follow multistep directions. Patient will try a new leisure interest.    Affect/Mood: N/A   Participation Level: Did not attend    Clinical Observations/Individualized Feedback: Patient did not attend.  Plan: Continue to engage patient in RT group sessions 2-3x/week.   Jeoffrey BRAVO Celestia, LRT, CTRS 04/18/2024 4:03 PM

## 2024-04-18 NOTE — Progress Notes (Signed)
 Behavior:   Pleasant and cooperative.  Discharged focused.   Psych assessment:  Denies SI/HI and AVH.    Interaction / Group attendance:  Present in the milieu.  Appropriate interaction with peers.  Attends groups.  Medication/ PRNs: Compliant.  Pain:  Denies  15 min checks in place for safety.

## 2024-04-18 NOTE — Plan of Care (Signed)
   Problem: Education: Goal: Knowledge of the prescribed therapeutic regimen will improve Outcome: Progressing   Problem: Activity: Goal: Interest or engagement in leisure activities will improve Outcome: Progressing   Problem: Coping: Goal: Coping ability will improve Outcome: Progressing

## 2024-04-18 NOTE — Group Note (Signed)
 Date:  04/18/2024 Time:  10:46 AM  Group Topic/Focus:   Effective coping skills for seniors involve a mix of mental, physical, and social activities, like mindfulness, light exercise (walking, yoga), creative outlets (art, music, writing), maintaining routines, and fostering social connections, which all reduce stress, combat loneliness, boost mood, and build resilience for life's changes. Deep breathing and journaling also offer immediate calm, while therapy can provide professional support.   Participation Level:  Active  Participation Quality:  Appropriate  Affect:  Appropriate  Cognitive:  Appropriate  Insight: Appropriate  Engagement in Group:  Engaged  Modes of Intervention:  Discussion  Additional Comments:  N/A  Kelly Doyle Gavel 04/18/2024, 10:46 AM

## 2024-04-19 LAB — GLUCOSE, CAPILLARY
Glucose-Capillary: 139 mg/dL — ABNORMAL HIGH (ref 70–99)
Glucose-Capillary: 152 mg/dL — ABNORMAL HIGH (ref 70–99)
Glucose-Capillary: 179 mg/dL — ABNORMAL HIGH (ref 70–99)
Glucose-Capillary: 280 mg/dL — ABNORMAL HIGH (ref 70–99)

## 2024-04-19 NOTE — Group Note (Signed)
 Date:  04/19/2024 Time:  10:40 AM  Group Topic/Focus:    For geriatric patients, combining gentle, low-impact exercises like walking, chair yoga, or Tai Chi with mindfulness and meditation (breathing, body scans) significantly boosts physical (balance, strength) and mental (less anxiety/depression, better focus) health, improving overall well-being by managing chronic pain and enhancing emotional resilience. Adaptations are key: use chairs for stability, focus on simple guided sessions, and prioritize comfort for balance, mobility, and sensory issues.  Meditation is a simple yet powerful way to support your emotional and physical well-being. Regular practice can help lower blood pressure, ease anxiety, and improve focus and sleep. From deep breathing and body scans to guided imagery and mindfulness, there are many styles to explore.  Participation Level:  Did Not Attend   Harlene LITTIE Gavel 04/19/2024, 10:40 AM

## 2024-04-19 NOTE — Plan of Care (Signed)

## 2024-04-19 NOTE — Progress Notes (Signed)
" °   04/18/24 2100  Psych Admission Type (Psych Patients Only)  Admission Status Voluntary  Psychosocial Assessment  Patient Complaints None  Eye Contact Fair  Facial Expression Animated  Affect Appropriate to circumstance  Speech Logical/coherent  Interaction Minimal  Motor Activity Slow  Appearance/Hygiene In scrubs  Behavior Characteristics Appropriate to situation;Cooperative  Mood Pleasant  Thought Process  Coherency WDL  Content WDL  Delusions None reported or observed  Perception WDL  Hallucination None reported or observed  Judgment Impaired  Confusion None  Danger to Self  Current suicidal ideation? Denies  Self-Injurious Behavior No self-injurious ideation or behavior indicators observed or expressed   Agreement Not to Harm Self Yes  Description of Agreement verbal  Danger to Others  Danger to Others None reported or observed   Mood/Behavior:  Pleasant and cooperative.    Psych assessment: Denies SI/HI and AVH.     Interaction / Group attendance:  Isolative. Minimal interaction with peers and staff.  Did not attend group.   Medication/ PRNs: Compliant with scheduled medications. No prns required.   Pain: Denies  15 min checks in place for safety. "

## 2024-04-19 NOTE — Progress Notes (Signed)
 Patient is a voluntary admission to [facility] with MDD with explected discharge tomorrow. Patient is calm and cooperative and interacts well with staff and peers. Denies SI, HI, AVH, anxiety and depression. CBG checks with sliding scale coverage.  Will continue to monitor.

## 2024-04-19 NOTE — Group Note (Signed)
 Date:  04/19/2024 Time:  3:39 PM  Group Topic/Focus:  Coping With Mental Health Crisis:   The purpose of this group is to help patients identify strategies for coping with mental health crisis.  Group discusses possible causes of crisis and ways to manage them effectively.    Participation Level:  Did Not Attend  Kelly Doyle 04/19/2024, 3:39 PM

## 2024-04-20 LAB — GLUCOSE, CAPILLARY
Glucose-Capillary: 166 mg/dL — ABNORMAL HIGH (ref 70–99)
Glucose-Capillary: 254 mg/dL — ABNORMAL HIGH (ref 70–99)
Glucose-Capillary: 214 mg/dL — ABNORMAL HIGH (ref 70–99)
Glucose-Capillary: 209 mg/dL — ABNORMAL HIGH (ref 70–99)

## 2024-04-20 NOTE — Group Note (Signed)
 Date:  04/20/2024 Time:  8:32 PM  Group Topic/Focus:  Wrap-Up Group:   The focus of this group is to help patients review their daily goal of treatment and discuss progress on daily workbooks.    Participation Level:  Did Not Attend [name] 04/20/2024, 8:32 PM

## 2024-04-20 NOTE — Group Note (Signed)
 Date:  04/20/2024 Time:  11:05 AM  Group Topic/Focus:  Making Healthy Choices:   The focus of this group is to help patients identify negative/unhealthy choices they were using prior to admission and identify positive/healthier coping strategies to replace them upon discharge.    Participation Level:  Did Not Attend   Maglione,Chimamanda Siegfried E 04/20/2024, 11:05 AM

## 2024-04-20 NOTE — Progress Notes (Signed)
" °   04/19/24 2238  Psych Admission Type (Psych Patients Only)  Admission Status Voluntary  Psychosocial Assessment  Patient Complaints None  Eye Contact Fair  Facial Expression Animated  Affect Appropriate to circumstance  Speech Logical/coherent  Interaction Minimal  Motor Activity Slow  Appearance/Hygiene In scrubs  Behavior Characteristics Cooperative;Appropriate to situation  Mood Pleasant  Thought Process  Coherency WDL  Content WDL  Delusions None reported or observed  Perception WDL  Hallucination None reported or observed  Judgment Impaired  Confusion None  Danger to Self  Current suicidal ideation? Denies  Self-Injurious Behavior No self-injurious ideation or behavior indicators observed or expressed   Agreement Not to Harm Self Yes  Description of Agreement verbal  Danger to Others  Danger to Others None reported or observed     Estimated Sleeping Duration (Last 24 Hours): 9.75-12.75 hours  "

## 2024-04-20 NOTE — Progress Notes (Signed)
 Patient accepted medications today without issues. Denies SI/HI/AVH. Required insulin  coverage for each meal. Patient expressed a desire to be discharged. Talked with provider and family about plan. Denies pain this shift. LBM today.  04/20/24 1300  Psych Admission Type (Psych Patients Only)  Admission Status Voluntary  Psychosocial Assessment  Patient Complaints None  Eye Contact Fair  Facial Expression Animated  Affect Appropriate to circumstance  Speech Logical/coherent  Interaction Minimal  Motor Activity Slow  Appearance/Hygiene Unremarkable  Behavior Characteristics Cooperative;Appropriate to situation  Mood Pleasant  Thought Process  Coherency WDL  Content WDL  Delusions None reported or observed  Perception WDL  Hallucination None reported or observed  Judgment Impaired  Confusion None  Danger to Self  Current suicidal ideation? Denies  Self-Injurious Behavior No self-injurious ideation or behavior indicators observed or expressed   Agreement Not to Harm Self Yes  Description of Agreement verbal  Danger to Others  Danger to Others None reported or observed

## 2024-04-20 NOTE — Group Note (Signed)
 Recreation Therapy Group Note   Group Topic:Goal Setting  Group Date: 04/20/2024 Start Time: 1330 End Time: 1405 Facilitators: Celestia Jeoffrey BRAVO, LRT, CTRS Location: Dayroom  Group Description: New Years Resolutions. Patients received a handout for them to write down their new years resolutions on it. The handout had a decorative border around the paper that patients could also color once they're finished writing their new year resolutions down. Afterwards, LRT encouraged patients to read out loud their new years resolutions to the group.   Goal Area(s) Addressed: Patient will increase communication,  Patient will identify a goal leading into the new year.  Patient will foster mutual support. Patient will express themselves through art.    Affect/Mood: N/A   Participation Level: Did not attend    Clinical Observations/Individualized Feedback: Patient did not attend.  Plan: Continue to engage patient in RT group sessions 2-3x/week.   Jeoffrey BRAVO Celestia, LRT, CTRS 04/20/2024 4:26 PM

## 2024-04-20 NOTE — Progress Notes (Signed)
" °  Patient: 69 year old female  Diagnoses:   Major Depressive Disorder   Psychosis, unspecified   Psychosocial stressors: Housing instability  Interval History / Current Status: The patient has been present in the milieu for a prolonged period. She reports doing well overall and denies suicidal ideation. No confusion is noted. She remains calm and cooperative. No overt psychotic symptoms are observed. No aggressive behaviors have been noted. No PRN medications have been required. Ongoing psychosocial stress related to housing instability continues to be addressed.  Mental Status Examination:   Appearance/Behavior: Calm, cooperative   Level of Consciousness: Alert   Orientation: Oriented 3   Speech: Normal rate, volume, and tone   Mood: Depressed but stable   Affect: Appropriate, congruent   Thought Process: Linear and goal directed   Thought Content: No delusions or hallucinations elicited   Insight/Judgment: Fair  Risk Assessment: The patient denies suicidal and homicidal ideation. No evidence of acute risk to self or others.  Medical Necessity: Continued inpatient psychiatric treatment remains medically necessary due to depressive symptoms with a history of psychosis and ongoing housing instability, requiring a structured therapeutic environment, medication management, and continued monitoring to maintain stability and support safe discharge planning.  Assessment: The patient is psychiatrically stable with improving mood. No active psychosis is evident. Housing instability remains a significant psychosocial stressor.  Plan:   Continue current treatment plan   Continue current medication regimen   Monitor mood and thought content   Continue coordination with social work for housing and discharge planning "

## 2024-04-20 NOTE — Plan of Care (Signed)
  Problem: Activity: Goal: Interest or engagement in leisure activities will improve Outcome: Progressing Goal: Imbalance in normal sleep/wake cycle will improve Outcome: Progressing   Problem: Coping: Goal: Coping ability will improve Outcome: Progressing

## 2024-04-20 NOTE — Plan of Care (Signed)
  Problem: Coping: Goal: Coping ability will improve Outcome: Not Progressing Goal: Will verbalize feelings Outcome: Not Progressing   Problem: Safety: Goal: Ability to disclose and discuss suicidal ideas will improve Outcome: Not Progressing Goal: Ability to identify and utilize support systems that promote safety will improve Outcome: Not Progressing

## 2024-04-20 NOTE — Group Note (Signed)
 [facility] LCSW Group Therapy Note   Group Date: 04/20/2024 Start Time: 1300 End Time: 1330  Type of Therapy and Topic:  Group Therapy:  Feelings around Relapse and Recovery  Participation Level:  Did Not Attend   Description of Group:    Patients in this group will discuss emotions they experience before and after a relapse. They will process how experiencing these feelings, or avoidance of experiencing them, relates to having a relapse. Facilitator will guide patients to explore emotions they have related to recovery. Patients will be encouraged to process which emotions are more powerful. They will be guided to discuss the emotional reaction significant others in their lives may have to patients relapse or recovery. Patients will be assisted in exploring ways to respond to the emotions of others without this contributing to a relapse.  Therapeutic Goals: Patient will identify two or more emotions that lead to relapse for them:  Patient will identify two emotions that result when they relapse:  Patient will identify two emotions related to recovery:  Patient will demonstrate ability to communicate their needs through discussion and/or role plays.   Summary of Patient Progress: Patient did not attend group.   Therapeutic Modalities:   Cognitive Behavioral Therapy Solution-Focused Therapy Assertiveness Training Relapse Prevention Therapy   Nadara JONELLE Fam, LCSW

## 2024-04-20 NOTE — Care Plan (Signed)
 Pt talked with son and he told her he has found some where for her to stay.  Pt asked that the social worker and or provider call her son to confirm the details. Sons contact information.  [place]  [phone]

## 2024-04-20 NOTE — Group Note (Signed)
 Date:  04/20/2024 Time:  3:44 PM  Group Topic/Focus:  Goals Group:   The focus of this group is to help patients establish daily goals to achieve during treatment and discuss how the patient can incorporate goal setting into their daily lives to aide in recovery.    Participation Level:  Did Not Attend   Kelly Doyle 04/20/2024, 3:44 PM

## 2024-04-21 LAB — GLUCOSE, CAPILLARY
Glucose-Capillary: 102 mg/dL — ABNORMAL HIGH (ref 70–99)
Glucose-Capillary: 204 mg/dL — ABNORMAL HIGH (ref 70–99)
Glucose-Capillary: 214 mg/dL — ABNORMAL HIGH (ref 70–99)
Glucose-Capillary: 198 mg/dL — ABNORMAL HIGH (ref 70–99)

## 2024-04-21 MED ORDER — INSULIN ASPART 100 UNIT/ML IJ SOLN
3.0000 [IU] | Freq: Three times a day (TID) | INTRAMUSCULAR | Status: DC
  Administered 2024-04-21 – 2024-04-22 (×3): 3 [IU] via SUBCUTANEOUS
  Filled 2024-04-21 (×3): qty 3

## 2024-04-21 NOTE — Plan of Care (Signed)
   Problem: Activity: Goal: Interest or engagement in leisure activities will improve Outcome: Progressing Goal: Imbalance in normal sleep/wake cycle will improve Outcome: Progressing

## 2024-04-21 NOTE — Group Note (Signed)
 Date:  04/21/2024 Time:  8:29 PM  Group Topic/Focus:  Healthy Communication:   The focus of this group is to discuss communication, barriers to communication, as well as healthy ways to communicate with others.    Participation Level:  Active  Participation Quality:  Appropriate  Affect:  Appropriate  Cognitive:  Appropriate  Insight: Good  Engagement in Group:  Engaged  Modes of Intervention:  Discussion  Additional Comments:    Romero Earnie Hope 04/21/2024, 8:29 PM

## 2024-04-21 NOTE — Progress Notes (Signed)
" °   04/21/24 1000  Psych Admission Type (Psych Patients Only)  Admission Status Voluntary  Psychosocial Assessment  Patient Complaints None  Eye Contact Fair  Facial Expression Animated  Affect Appropriate to circumstance  Speech Logical/coherent  Interaction Minimal  Motor Activity Slow  Appearance/Hygiene Unremarkable  Behavior Characteristics Appropriate to situation  Mood Pleasant  Thought Process  Coherency WDL  Content WDL  Delusions None reported or observed  Perception WDL  Hallucination None reported or observed  Judgment Impaired  Confusion None  Danger to Self  Current suicidal ideation? Denies  Agreement Not to Harm Self Yes  Description of Agreement verbal  Danger to Others  Danger to Others None reported or observed    "

## 2024-04-21 NOTE — Group Note (Signed)
 Recreation Therapy Group Note   Group Topic:Emotion Expression  Group Date: 04/21/2024 Start Time: 1400 End Time: 1430 Facilitators: Celestia Jeoffrey BRAVO, LRT, CTRS Location: Courtyard  Group Description: Music. Patients are encouraged to name their favorite song(s) for LRT to play song through speaker for group to hear, while in the courtyard getting fresh air and sunlight. Patients educated on the definition of leisure and the importance of having different leisure interests outside of the hospital. Group discussed how leisure activities can often be used as pharmacologist and that listening to music and being outside are examples.    Goal Area(s) Addressed:  Patient will identify a current leisure interest.  Patient will practice making a positive decision. Patient will have the opportunity to try a new leisure activity.   Affect/Mood: N/A   Participation Level: Did not attend    Clinical Observations/Individualized Feedback: Patient did not attend.  Plan: Continue to engage patient in RT group sessions 2-3x/week.   Jeoffrey BRAVO Celestia, LRT, CTRS 04/21/2024 4:53 PM

## 2024-04-21 NOTE — Inpatient Diabetes Management (Signed)
 Inpatient Diabetes Program Recommendations  AACE/ADA: New Consensus Statement on Inpatient Glycemic Control (2015)  Target Ranges:  Prepandial:   less than 140 mg/dL      Peak postprandial:   less than 180 mg/dL (1-2 hours)      Critically ill patients:  140 - 180 mg/dL    Latest Reference Range & Units 04/20/24 07:08 04/20/24 10:59 04/20/24 16:18 04/20/24 19:10  Glucose-Capillary 70 - 99 mg/dL 833 (H) 745 (H) 785 (H) 209 (H)  (H): Data is abnormally high  Latest Reference Range & Units 04/21/24 07:41 04/21/24 11:24  Glucose-Capillary 70 - 99 mg/dL 897 (H) 795 (H)  (H): Data is abnormally high     Home DM Meds: Lantus  30 units daily                             Ozempic 0.25 mg Qweek   Current Orders: Novolog  Moderate Correction Scale/ SSI (0-15 units) TID AC                            Jardiance  25 mg daily     Lantus  15 units at HS    MD- Note afternoon CBG elevations  Please consider adding Novolog  Meal Coverage: Novolog  3 units TID with meals HOLD if pt NPO HOLD if pt eats <50% meals     --Will follow patient during hospitalization--  Adina Rudolpho Arrow RN, MSN, CDCES Diabetes Coordinator Inpatient Glycemic Control Team Team Pager: [phone] (8a-5p)

## 2024-04-21 NOTE — Group Note (Signed)
 Physical/Occupational Therapy Group Note  Group Topic: Neurographic Art  Group Date: 04/21/2024 Start Time: 1305 End Time: 1355 Facilitators: Clive Warren CROME, OT   Group Description: Group participated with Neurographic art activity, using watercolor paints to facilitate creative expression and meditation/relaxation for each individual.  Incorporated bimanual coordination, mental focus, emotional processing, task/command following and relaxation techniques as appropriate.  Patients engaged socially with therapist and other group participants throughout session. Allowed to ask questions as appropriate, and encouraged to identify ways they could use/share their creations with themselves and others.  Therapeutic Goal(s):  Demonstrate ability to independently manipulate utensils required to participate with and complete activity. Demonstrate ability to cognitively focus on task and follow commands necessary for completion. Demonstrate use of art as an outlet for emotional processing and expression. Identify and demonstrate importance of relaxation, neural calming and meditation for improved participation with life groups.  Individual Participation: Pt did not attend.  Participation Level: Did not attend   Participation Quality:   Behavior:   Speech/Thought Process:   Affect/Mood:   Insight:   Judgement:   Modes of Intervention:   Patient Response to Interventions:    Plan: Continue to engage patient in PT/OT groups 1 - 2x/week.  Darsi Tien R., MPH, MS, OTR/L ascom [phone] 04/21/2024, 3:32 PM

## 2024-04-21 NOTE — Group Note (Signed)
 Date:  04/21/2024 Time:  3:40 PM  Group Topic/Focus:  Coping With Mental Health Crisis:   The purpose of this group is to help patients identify strategies for coping with mental health crisis.  Group discusses possible causes of crisis and ways to manage them effectively.    Participation Level:  Did Not Attend    Kelly Doyle Bias 04/21/2024, 3:40 PM

## 2024-04-21 NOTE — Progress Notes (Signed)
" °   04/20/24 2319  Psych Admission Type (Psych Patients Only)  Admission Status Voluntary  Psychosocial Assessment  Patient Complaints None  Eye Contact Fair  Facial Expression Animated  Affect Appropriate to circumstance  Speech Logical/coherent  Interaction Minimal  Motor Activity Slow  Appearance/Hygiene Unremarkable  Behavior Characteristics Appropriate to situation;Cooperative  Mood Pleasant  Thought Process  Coherency WDL  Content WDL  Delusions None reported or observed  Perception WDL  Hallucination None reported or observed  Judgment Impaired  Confusion None  Danger to Self  Current suicidal ideation? Denies  Self-Injurious Behavior No self-injurious ideation or behavior indicators observed or expressed   Agreement Not to Harm Self Yes  Description of Agreement verbal  Danger to Others  Danger to Others None reported or observed    Estimated Sleeping Duration (Last 24 Hours): 8.50-9.25 hours  "

## 2024-04-21 NOTE — Group Note (Signed)
 Date:  04/21/2024 Time:  12:14 PM  Group Topic/Focus:  Managing Feelings:   The focus of this group is to identify what feelings patients have difficulty handling and develop a plan to handle them in a healthier way upon discharge.    Participation Level:  Did Not Attend   Larrie Leita BRAVO 04/21/2024, 12:14 PM

## 2024-04-22 LAB — GLUCOSE, CAPILLARY
Glucose-Capillary: 140 mg/dL — ABNORMAL HIGH (ref 70–99)
Glucose-Capillary: 156 mg/dL — ABNORMAL HIGH (ref 70–99)

## 2024-04-22 NOTE — Group Note (Signed)
 LCSW Group Therapy Note  Group Date: 04/22/2024 Start Time: 1032 End Time: 1110   Type of Therapy and Topic:  Group Therapy - Healthy vs Unhealthy Coping Skills  Participation Level:  Did Not Attend   Description of Group The focus of this group was to determine what unhealthy coping techniques typically are used by group members and what healthy coping techniques would be helpful in coping with various problems. Patients were guided in becoming aware of the differences between healthy and unhealthy coping techniques. Patients were asked to identify 2-3 healthy coping skills they would like to learn to use more effectively.  Therapeutic Goals Patients learned that coping is what human beings do all day long to deal with various situations in their lives Patients defined and discussed healthy vs unhealthy coping techniques Patients identified their preferred coping techniques and identified whether these were healthy or unhealthy Patients determined 2-3 healthy coping skills they would like to become more familiar with and use more often. Patients provided support and ideas to each other   Summary of Patient Progress:  Patient did not attend group. Therapeutic Modalities Cognitive Behavioral Therapy Motivational Interviewing  Jamae Tison W Dyllan Kats, LCSWA 04/22/2024  12:12 PM

## 2024-04-22 NOTE — Plan of Care (Signed)
  Problem: Education: Goal: Utilization of techniques to improve thought processes will improve Outcome: Adequate for Discharge Goal: Knowledge of the prescribed therapeutic regimen will improve Outcome: Adequate for Discharge   Problem: Activity: Goal: Interest or engagement in leisure activities will improve Outcome: Adequate for Discharge Goal: Imbalance in normal sleep/wake cycle will improve Outcome: Adequate for Discharge   Problem: Coping: Goal: Coping ability will improve Outcome: Adequate for Discharge Goal: Will verbalize feelings Outcome: Adequate for Discharge   Problem: Health Behavior/Discharge Planning: Goal: Ability to make decisions will improve Outcome: Adequate for Discharge Goal: Compliance with therapeutic regimen will improve Outcome: Adequate for Discharge   Problem: Role Relationship: Goal: Will demonstrate positive changes in social behaviors and relationships Outcome: Adequate for Discharge   Problem: Safety: Goal: Ability to disclose and discuss suicidal ideas will improve Outcome: Adequate for Discharge Goal: Ability to identify and utilize support systems that promote safety will improve Outcome: Adequate for Discharge   Problem: Self-Concept: Goal: Will verbalize positive feelings about self Outcome: Adequate for Discharge Goal: Level of anxiety will decrease Outcome: Adequate for Discharge

## 2024-04-22 NOTE — Progress Notes (Signed)
 Patient denies SI/HI/AVH at this time. Discharge instructions, AVS, and transition record reviewed with patient. Patient changed to street clothes. Patient phoned son for updates. Patient questions and concerns addressed and answered. Patient waiting for transport. ETA 2:40pm.

## 2024-04-22 NOTE — [facility] Suicide Risk Assessment (Signed)
 [facility] Discharge Suicide Risk Assessment   Principal Problem: <principal problem not specified> Discharge Diagnoses: Active Problems:   MDD (major depressive disorder), recurrent episode, moderate (HCC)   Total Time spent with patient: 45 minutes  Musculoskeletal: Strength & Muscle Tone: within normal limits Gait & Station: normal Patient leans: N/A  Psychiatric Specialty Exam  Presentation  General Appearance:  Appropriate for Environment  Eye Contact: Fair  Speech: Clear and Coherent  Speech Volume: Normal  Handedness: Right   Mood and Affect  Mood: Depressed; Dysphoric  Duration of Depression Symptoms: Greater than two weeks  Affect: Appropriate   Thought Process  Thought Processes: Coherent  Descriptions of Associations:Intact  Orientation:Full (Time, Place and Person)  Thought Content:Logical  History of Schizophrenia/Schizoaffective disorder:No  Duration of Psychotic Symptoms:No data recorded Hallucinations:No data recorded Ideas of Reference:None  Suicidal Thoughts:No data recorded Homicidal Thoughts:No data recorded  Sensorium  Memory: Immediate Fair  Judgment: Impaired  Insight: Shallow   Executive Functions  Concentration: Fair  Attention Span: Fair  Recall: Fair  Fund of Knowledge: Fair  Language: Fair   Psychomotor Activity  Psychomotor Activity:No data recorded  Assets  Assets: Communication Skills; Desire for Improvement   Sleep  Sleep:No data recorded Estimated Sleeping Duration (Last 24 Hours): 8.00-10.50 hours  Physical Exam: Physical Exam ROS Blood pressure 118/70, pulse (!) 45, temperature 97.8 F (36.6 C), resp. rate 17, height 5' 2 (1.575 m), weight 68.5 kg, SpO2 99%. Body mass index is 27.62 kg/m.  Mental Status Per Nursing Assessment::   On Admission:  Suicidal ideation indicated by patient (No plan or intent)  Demographic Factors:  Age 29 or older  Loss Factors: NA  Historical  Factors: Impulsivity  Risk Reduction Factors:   NA  Continued Clinical Symptoms:  Depression:   Hopelessness  Cognitive Features That Contribute To Risk:  Closed-mindedness    Suicide Risk:  Mild:  Suicidal ideation of limited frequency, intensity, duration, and specificity.  There are no identifiable plans, no associated intent, mild dysphoria and related symptoms, good self-control (both objective and subjective assessment), few other risk factors, and identifiable protective factors, including available and accessible social support.   Follow-up Information     [facility]  Follow up on 05/30/2024.   Why: You are scheduled for psychiatry follow-up on 05/30/24 at 1:30 PM. You must arrive by 1:00 PM for your first appointment. Paperwork has been mailed to your house to be filled out and brought with you prior to your appointment.                Plan Of Care/Follow-up recommendations:  Activity:  normal  Millie JONELLE Manners, MD 04/22/2024, 12:16 PM

## 2024-04-22 NOTE — Group Note (Signed)
 Date:  04/22/2024 Time:  10:35 AM  Group Topic/Focus:  Coping With Mental Health Crisis:   The purpose of this group is to help patients identify strategies for coping with mental health crisis.  Group discusses possible causes of crisis and ways to manage them effectively.    Participation Level:  Did Not Attend    Norleen SHAUNNA Bias 04/22/2024, 10:35 AM

## 2024-04-22 NOTE — Care Plan (Signed)
 CSW on unit and provided taxi voucher and information about the [employer] that pts son has pre paid for. This is where pt will be reporting to after DC Voucher will be in the front of pt file.

## 2024-04-22 NOTE — Progress Notes (Addendum)
" °  [facility] Adult Case Management Discharge Plan :  Will you be returning to the same living situation after discharge:  No. Patient is going to [facility] hotel to stay with son. Spoke to son [place] to verify.  At discharge, do you have transportation home?: Yes,  taxi voucher Do you have the ability to pay for your medications: Yes,  medicare  Release of information consent forms completed and in the chart;  Patient's signature needed at discharge.  Patient to Follow up at:  Follow-up Information     [facility]  Follow up on 05/30/2024.   Why: You are scheduled for psychiatry follow-up on 05/30/24 at 1:30 PM. You must arrive by 1:00 PM for your first appointment. Paperwork has been mailed to your house to be filled out and brought with you prior to your appointment.                Next level of care provider has access to [facility] Link:yes  Safety Planning and Suicide Prevention discussed: Yes,  Son [place]     Has patient been referred to the Quitline?: Patient refused referral for treatment  Patient has been referred for addiction treatment: No known substance use disorder.  Pamila Nine, LCSW 04/22/2024, 10:00 AM "

## 2024-04-22 NOTE — Progress Notes (Signed)
 Taxi arrived. Patient off the unit via wheelchair with all belongings including cellphone and charger with no signs of distress.

## 2024-04-22 NOTE — Plan of Care (Signed)
" °  Problem: Education: Goal: Utilization of techniques to improve thought processes will improve Outcome: Progressing Goal: Knowledge of the prescribed therapeutic regimen will improve Outcome: Progressing   Problem: Activity: Goal: Interest or engagement in leisure activities will improve Outcome: Progressing Goal: Imbalance in normal sleep/wake cycle will improve Outcome: Progressing   Problem: Coping: Goal: Coping ability will improve Outcome: Progressing Goal: Will verbalize feelings Outcome: Progressing   Problem: Activity: Goal: Interest or engagement in leisure activities will improve Outcome: Progressing Goal: Imbalance in normal sleep/wake cycle will improve Outcome: Progressing   "

## 2024-04-22 NOTE — Discharge Summary (Signed)
 " Physician Discharge Summary Note  Patient:  Kelly Doyle is an 69 y.o., female MRN:  [id number] DOB:  1955-12-26 Patient phone:  [phone] (home)  Patient address:   [address] [place] [address],  Total Time spent with patient: {Time; 15 min - 8 hours:17441}  Date of Admission:  04/14/2024 Date of Discharge: ***  Reason for Admission:  ***  Principal Problem: <principal problem not specified> Discharge Diagnoses: Active Problems:   MDD (major depressive disorder), recurrent episode, moderate (HCC)   Past Psychiatric History: ***  Past Medical History:  Past Medical History:  Diagnosis Date   Anxiety    Arthritis    Dementia (HCC)    per son on 12/25   Diabetes mellitus without complication (HCC)    GERD (gastroesophageal reflux disease)    Hypertension     Past Surgical History:  Procedure Laterality Date   COLONOSCOPY WITH PROPOFOL  N/A 06/23/2017   Procedure: COLONOSCOPY WITH PROPOFOL ;  Surgeon: Toledo, Ladell MARLA, MD;  Location: [facility] ENDOSCOPY;  Service: Gastroenterology;  Laterality: N/A;   DILATION AND CURETTAGE OF UTERUS  04/20/1973   NO PAST SURGERIES     TUBAL LIGATION     Family History:  Family History  Problem Relation Age of Onset   Alzheimer's disease Mother    Hypertension Mother    Diabetes Sister    Gout Brother    Family Psychiatric  History: *** Social History:  Social History   Substance and Sexual Activity  Alcohol Use No     Social History   Substance and Sexual Activity  Drug Use No    Social History   Socioeconomic History   Marital status: Single    Spouse name: Not on file   Number of children: Not on file   Years of education: Not on file   Highest education level: Not on file  Occupational History   Not on file  Tobacco Use   Smoking status: Never   Smokeless tobacco: Never  Vaping Use   Vaping status: Never Used  Substance and Sexual Activity   Alcohol use: No   Drug use: No   Sexual activity: Yes     Birth control/protection: None  Other Topics Concern   Not on file  Social History Narrative   Not on file   Social Drivers of Health   Tobacco Use: Low Risk (04/14/2024)   Patient History    Smoking Tobacco Use: Never    Smokeless Tobacco Use: Never    Passive Exposure: Not on file  Financial Resource Strain: Not on file  Food Insecurity: No Food Insecurity (04/14/2024)   Epic    Worried About Programme Researcher, Broadcasting/film/video in the Last Year: Never true    Ran Out of Food in the Last Year: Never true  Transportation Needs: No Transportation Needs (04/14/2024)   Epic    Lack of Transportation (Medical): No    Lack of Transportation (Non-Medical): No  Physical Activity: Not on file  Stress: Not on file  Social Connections: Socially Isolated (04/14/2024)   Social Connection and Isolation Panel    Frequency of Communication with Friends and Family: Once a week    Frequency of Social Gatherings with Friends and Family: Never    Attends Religious Services: Never    Database Administrator or Organizations: Yes    Attends Banker Meetings: Never    Marital Status: Never married  Depression (PHQ2-9): Not on file  Alcohol Screen: Low Risk (04/14/2024)  Alcohol Screen    Last Alcohol Screening Score (AUDIT): 0  Housing: High Risk (04/14/2024)   Epic    Unable to Pay for Housing in the Last Year: No    Number of Times Moved in the Last Year: 2    Homeless in the Last Year: Yes  Utilities: Not At Risk (04/14/2024)   Epic    Threatened with loss of utilities: No  Health Literacy: Not on file    Hospital Course:  ***  Physical Findings: AIMS:  , ,  ,  ,  ,  ,   CIWA:    COWS:     Musculoskeletal: Strength & Muscle Tone: {desc; muscle tone:32375} Gait & Station: {PE GAIT ED WJUO:77474} Patient leans: {Patient Leans:21022755}   Psychiatric Specialty Exam:  Presentation  General Appearance:  Appropriate for Environment  Eye Contact: Fair  Speech: Clear and  Coherent  Speech Volume: Normal  Handedness: Right   Mood and Affect  Mood: Depressed; Dysphoric  Affect: Appropriate   Thought Process  Thought Processes: Coherent  Descriptions of Associations:Intact  Orientation:Full (Time, Place and Person)  Thought Content:Logical  History of Schizophrenia/Schizoaffective disorder:No  Duration of Psychotic Symptoms:No data recorded Hallucinations:No data recorded Ideas of Reference:None  Suicidal Thoughts:No data recorded Homicidal Thoughts:No data recorded  Sensorium  Memory: Immediate Fair  Judgment: Impaired  Insight: Shallow   Executive Functions  Concentration: Fair  Attention Span: Fair  Recall: Fair  Fund of Knowledge: Fair  Language: Fair   Psychomotor Activity  Psychomotor Activity:No data recorded  Assets  Assets: Communication Skills; Desire for Improvement   Sleep  Sleep:No data recorded Estimated Sleeping Duration (Last 24 Hours): 8.00-10.50 hours   Physical Exam: Physical Exam ROS Blood pressure 118/70, pulse (!) 45, temperature 97.8 F (36.6 C), resp. rate 17, height 5' 2 (1.575 m), weight 68.5 kg, SpO2 99%. Body mass index is 27.62 kg/m.   Tobacco Use History[1] Tobacco Cessation:  {Discharge tobacco cessation prescription:304700209}   Blood Alcohol level:  Lab Results  Component Value Date   [facility] <15 04/13/2024   ETH <15 11/01/2023    Metabolic Disorder Labs:  Lab Results  Component Value Date   HGBA1C 9.4 (H) 04/15/2024   MPG 223.08 04/15/2024   MPG 143 11/01/2023   No results found for: PROLACTIN Lab Results  Component Value Date   CHOL 199 04/15/2024   TRIG 123 04/15/2024   HDL 64 04/15/2024   CHOLHDL 3.1 04/15/2024   VLDL 25 04/15/2024   LDLCALC 111 (H) 04/15/2024   LDLCALC 67 04/28/2021    See Psychiatric Specialty Exam and Suicide Risk Assessment completed by Attending Physician prior to discharge.  Discharge destination:  {DISCHARGE  DESTINATION:22616}  Is patient on multiple antipsychotic therapies at discharge:  {RECOMMEND TAPERING:22617}   Has Patient had three or more failed trials of antipsychotic monotherapy by history:  {BHH ANTIPSYCHOTIC:22903}  Recommended Plan for Multiple Antipsychotic Therapies: {BHH MULTIPLE ANTIPSYCHOTIC THERAPIES:22905}   Allergies as of 04/22/2024   No Known Allergies      Medication List     STOP taking these medications    acetaminophen  650 MG CR tablet Commonly known as: TYLENOL    BD Pen Needle Nano 2nd Gen 32G X 4 MM Misc Generic drug: Insulin  Pen Needle   blood glucose meter kit and supplies   blood glucose meter kit and supplies Kit   empagliflozin  25 MG Tabs tablet Commonly known as: Jardiance    insulin  aspart 100 UNIT/ML injection Commonly known as: novoLOG    insulin   lispro 100 UNIT/ML KwikPen Commonly known as: HUMALOG    Insupen Pen Needles 31G X 5 MM Misc Generic drug: Insulin  Pen Needle   Lantus  SoloStar 100 UNIT/ML Solostar Pen Generic drug: insulin  glargine   pantoprazole  40 MG tablet Commonly known as: PROTONIX    sertraline  50 MG tablet Commonly known as: ZOLOFT    traZODone  50 MG tablet Commonly known as: DESYREL    Victoza 18 MG/3ML Sopn Generic drug: liraglutide       TAKE these medications      Indication  amLODipine  10 MG tablet Commonly known as: NORVASC  Take 1 tablet (10 mg total) by mouth daily.    atorvastatin  40 MG tablet Commonly known as: LIPITOR Take 1 tablet (40 mg total) by mouth daily.    baclofen 10 MG tablet Commonly known as: LIORESAL Take 10 mg by mouth daily as needed.    escitalopram  10 MG tablet Commonly known as: LEXAPRO  Take 10 mg by mouth daily.    lisinopril  40 MG tablet Commonly known as: ZESTRIL  Take 1 tablet (40 mg total) by mouth daily.    Ozempic (0.25 or 0.5 MG/DOSE) 2 MG/3ML Sopn Generic drug: Semaglutide(0.25 or 0.5MG /DOS) Inject 0.25 mg into the skin once a week.         Follow-up  Information     [facility]  Follow up on 05/30/2024.   Why: You are scheduled for psychiatry follow-up on 05/30/24 at 1:30 PM. You must arrive by 1:00 PM for your first appointment. Paperwork has been mailed to your house to be filled out and brought with you prior to your appointment.                Follow-up recommendations:  {BHH DC FU RECOMMENDATIONS:22620}  Comments:  ***  Signed: Millie JONELLE Manners, MD 04/22/2024, 12:20 PM           [1]  Social History Tobacco Use  Smoking Status Never  Smokeless Tobacco Never   "

## 2024-05-21 NOTE — Progress Notes (Unsigned)
 " Psychiatric Initial Adult Assessment   Patient Identification: Kelly Doyle MRN:  [id number] Date of Evaluation:  05/21/2024 Referral Source: *** Chief Complaint:  No chief complaint on file.  Visit Diagnosis: No diagnosis found.  History of Present Illness:   Kelly Doyle is a 69 y.o.  female with a history of depression, anxiety, who is referred for depression.   According to the chart review, she was admitted to [facility] in Dec 2025 for SI.  Living Situation: Unstable. Was living with her grandson, but his girlfriend no longer wanted the pt there. Her daughter's house had too many people there, so was not a long-term option. Has [place] some time in the park when there was no other option. Expresses that she may be able to get housing with her son, who is currently in [place] at a drug rehabilitation center. He is expected to return to the area on August 1st. She does express worry that this may not be a reliable option, and that her son may turn to drugs again.  Medication- lexapro  10 mg daily  Support - ***  Family - ***  Social/spirituality - ***  Education/Work- ***  Leisure- ***  Physical - ***  Medication- ***  Exercise: Support: Household:  Marital status: Number of children: Employment:  Education:     Substance use  Tobacco Alcohol Other substances/  Current *** *** ***  Past *** *** ***  Past Treatment            Associated Signs/Symptoms: Depression Symptoms:  {DEPRESSION SYMPTOMS:20000} (Hypo) Manic Symptoms:  {BHH MANIC SYMPTOMS:22872} Anxiety Symptoms:  {BHH ANXIETY SYMPTOMS:22873} Psychotic Symptoms:  {BHH PSYCHOTIC SYMPTOMS:22874} PTSD Symptoms: {BHH PTSD DBFEUNFD:77124}  Past Psychiatric History:  Outpatient: [facility] Psychiatry admission: 03/2024, 10/2023 at [facility] for SI, in 2016 for SI, HI in 1973 Previous suicide attempt:  Past trials of medication:  History of violence:  History of head injury:   Previous Psychotropic  Medications: {YES/NO:21197}  Substance Abuse History in the last 12 months:  {yes no:314532}  Consequences of Substance Abuse: {BHH CONSEQUENCES OF SUBSTANCE ABUSE:22880}  Past Medical History:  Past Medical History:  Diagnosis Date   Anxiety    Arthritis    Dementia (HCC)    per son on 12/25   Diabetes mellitus without complication (HCC)    GERD (gastroesophageal reflux disease)    Hypertension     Past Surgical History:  Procedure Laterality Date   COLONOSCOPY WITH PROPOFOL  N/A 06/23/2017   Procedure: COLONOSCOPY WITH PROPOFOL ;  Surgeon: Toledo, Ladell MARLA, MD;  Location: [facility] ENDOSCOPY;  Service: Gastroenterology;  Laterality: N/A;   DILATION AND CURETTAGE OF UTERUS  04/20/1973   NO PAST SURGERIES     TUBAL LIGATION      Family Psychiatric History: ***  Family History:  Family History  Problem Relation Age of Onset   Alzheimer's disease Mother    Hypertension Mother    Diabetes Sister    Gout Brother     Social History:   Social History   Socioeconomic History   Marital status: Single    Spouse name: Not on file   Number of children: Not on file   Years of education: Not on file   Highest education level: Not on file  Occupational History   Not on file  Tobacco Use   Smoking status: Never   Smokeless tobacco: Never  Vaping Use   Vaping status: Never Used  Substance and Sexual Activity   Alcohol use: No  Drug use: No   Sexual activity: Yes    Birth control/protection: None  Other Topics Concern   Not on file  Social History Narrative   Not on file   Social Drivers of Health   Tobacco Use: Low Risk (04/14/2024)   Patient History    Smoking Tobacco Use: Never    Smokeless Tobacco Use: Never    Passive Exposure: Not on file  Financial Resource Strain: Not on file  Food Insecurity: No Food Insecurity (04/14/2024)   Epic    Worried About Programme Researcher, Broadcasting/film/video in the Last Year: Never true    Ran Out of Food in the Last Year: Never true   Transportation Needs: No Transportation Needs (04/14/2024)   Epic    Lack of Transportation (Medical): No    Lack of Transportation (Non-Medical): No  Physical Activity: Not on file  Stress: Not on file  Social Connections: Socially Isolated (04/14/2024)   Social Connection and Isolation Panel    Frequency of Communication with Friends and Family: Once a week    Frequency of Social Gatherings with Friends and Family: Never    Attends Religious Services: Never    Database Administrator or Organizations: Yes    Attends Banker Meetings: Never    Marital Status: Never married  Depression (PHQ2-9): Not on file  Alcohol Screen: Low Risk (04/14/2024)   Alcohol Screen    Last Alcohol Screening Score (AUDIT): 0  Housing: High Risk (04/14/2024)   Epic    Unable to Pay for Housing in the Last Year: No    Number of Times Moved in the Last Year: 2    Homeless in the Last Year: Yes  Utilities: Not At Risk (04/14/2024)   Epic    Threatened with loss of utilities: No  Health Literacy: Not on file    Additional Social History: as above  Allergies:  Allergies[1]  Metabolic Disorder Labs: Lab Results  Component Value Date   HGBA1C 9.4 (H) 04/15/2024   MPG 223.08 04/15/2024   MPG 143 11/01/2023   No results found for: PROLACTIN Lab Results  Component Value Date   CHOL 199 04/15/2024   TRIG 123 04/15/2024   HDL 64 04/15/2024   CHOLHDL 3.1 04/15/2024   VLDL 25 04/15/2024   LDLCALC 111 (H) 04/15/2024   LDLCALC 67 04/28/2021   Lab Results  Component Value Date   TSH 2.026 04/28/2021    Therapeutic Level Labs: No results found for: LITHIUM No results found for: CBMZ No results found for: VALPROATE  Current Medications: Current Outpatient Medications  Medication Sig Dispense Refill   amLODipine  (NORVASC ) 10 MG tablet Take 1 tablet (10 mg total) by mouth daily. 30 tablet 2   atorvastatin  (LIPITOR) 40 MG tablet Take 1 tablet (40 mg total) by mouth daily.  90 tablet 1   baclofen (LIORESAL) 10 MG tablet Take 10 mg by mouth daily as needed.     escitalopram  (LEXAPRO ) 10 MG tablet Take 10 mg by mouth daily.     lisinopril  (ZESTRIL ) 40 MG tablet Take 1 tablet (40 mg total) by mouth daily. 30 tablet 2   OZEMPIC, 0.25 OR 0.5 MG/DOSE, 2 MG/3ML SOPN Inject 0.25 mg into the skin once a week.     No current facility-administered medications for this visit.    Musculoskeletal: Strength & Muscle Tone: within normal limits Gait & Station: normal Patient leans: N/A  Psychiatric Specialty Exam: Review of Systems  There were no vitals taken for  this visit.There is no height or weight on file to calculate BMI.  General Appearance: {Appearance:22683}  Eye Contact:  {BHH EYE CONTACT:22684}  Speech:  Clear and Coherent  Volume:  Normal  Mood:  {BHH MOOD:22306}  Affect:  {Affect (PAA):22687}  Thought Process:  Coherent  Orientation:  Full (Time, Place, and Person)  Thought Content:  Logical  Suicidal Thoughts:  {ST/HT (PAA):22692}  Homicidal Thoughts:  {ST/HT (PAA):22692}  Memory:  Immediate;   Good  Judgement:  {Judgement (PAA):22694}  Insight:  {Insight (PAA):22695}  Psychomotor Activity:  Normal  Concentration:  Concentration: Good and Attention Span: Good  Recall:  Good  Fund of Knowledge:Good  Language: Good  Akathisia:  No  Handed:  Right  AIMS (if indicated):  not done  Assets:  Communication Skills Desire for Improvement  ADL's:  Intact  Cognition: WNL  Sleep:  {BHH GOOD/FAIR/POOR:22877}   Screenings: AIMS    Flowsheet Row Admission (Discharged) from 10/04/2014 in [facility]PATIENT BE[facility]ICINE  AIMS Total Score 0   AUDIT    Flowsheet Row Admission (Discharged) from 04/14/2024 in [facility] BEH[facility] MED[facility] Admission (Discharged) from 11/02/2023 in [facility] BEHAVIORAL MED[facility]INE Admission (Discharged) from 10/04/2014 in [facility] INPATIENT BEHAVIORAL MEDICINE  Alcohol Use Disorder Identification Test Final Score (AUDIT)  0 0 0   Flowsheet Row Admission (Discharged) from 04/14/2024 in [facility] BEHAVIORAL MEDICINE ED from 04/13/2024 in [facility] Department at [facility] Admission (Discharged) from 11/02/2023 in [facility] BEHAVIORAL MEDICINE  C-SSRS RISK CATEGORY Low Risk High Risk Error: Question 6 not populated    Assessment and Plan:    Plan   The patient demonstrates the following risk factors for suicide: Chronic risk factors for suicide include: {Chronic Risk Factors for Dlprpiz:69585988}. Acute risk factors for suicide include: {Acute Risk Factors for Dlprpiz:69585987}. Protective factors for this patient include: {Protective Factors for Suicide Mpdx:69585986}. Considering these factors, the overall suicide risk at this point appears to be {Desc; low/moderate/high:110033}. Patient {ACTION; IS/IS WNU:78978602} appropriate for outpatient follow up.   Collaboration of Care: {BH OP Collaboration of Care:21014065}  Patient/Guardian was advised Release of Information must be obtained prior to any record release in order to collaborate their care with an outside provider. Patient/Guardian was advised if they have not already done so to contact the registration department to sign all necessary forms in order for us  to release information regarding their care.   Consent: Patient/Guardian gives verbal consent for treatment and assignment of benefits for services provided during this visit. Patient/Guardian expressed understanding and agreed to proceed.   Katheren Sleet, MD 2/1/20268:29 AM     [1] No Known Allergies  "

## 2024-05-25 ENCOUNTER — Ambulatory Visit: Admitting: Psychiatry

## 2024-05-30 ENCOUNTER — Ambulatory Visit: Admitting: Psychiatry
